# Patient Record
Sex: Female | Born: 1960 | Race: Black or African American | Hispanic: No | Marital: Single | Smoking: Former smoker
Health system: Southern US, Community
[De-identification: ages and names within clinical notes are randomized; demographics above are authoritative.]

## PROBLEM LIST (undated history)

## (undated) DIAGNOSIS — F32A Depression, unspecified: Secondary | ICD-10-CM

## (undated) DIAGNOSIS — Z9221 Personal history of antineoplastic chemotherapy: Secondary | ICD-10-CM

## (undated) DIAGNOSIS — Z923 Personal history of irradiation: Secondary | ICD-10-CM

## (undated) DIAGNOSIS — K219 Gastro-esophageal reflux disease without esophagitis: Secondary | ICD-10-CM

## (undated) DIAGNOSIS — K8020 Calculus of gallbladder without cholecystitis without obstruction: Secondary | ICD-10-CM

## (undated) DIAGNOSIS — F329 Major depressive disorder, single episode, unspecified: Secondary | ICD-10-CM

## (undated) DIAGNOSIS — E119 Type 2 diabetes mellitus without complications: Secondary | ICD-10-CM

## (undated) DIAGNOSIS — D649 Anemia, unspecified: Secondary | ICD-10-CM

## (undated) DIAGNOSIS — F209 Schizophrenia, unspecified: Secondary | ICD-10-CM

## (undated) DIAGNOSIS — E785 Hyperlipidemia, unspecified: Secondary | ICD-10-CM

## (undated) DIAGNOSIS — C801 Malignant (primary) neoplasm, unspecified: Secondary | ICD-10-CM

## (undated) DIAGNOSIS — I10 Essential (primary) hypertension: Secondary | ICD-10-CM

## (undated) HISTORY — DX: Anemia, unspecified: D64.9

## (undated) HISTORY — DX: Type 2 diabetes mellitus without complications: E11.9

## (undated) HISTORY — DX: Hyperlipidemia, unspecified: E78.5

## (undated) HISTORY — PX: ABDOMINAL HYSTERECTOMY: SHX81

## (undated) HISTORY — DX: Malignant (primary) neoplasm, unspecified: C80.1

## (undated) HISTORY — DX: Gastro-esophageal reflux disease without esophagitis: K21.9

---

## 1998-04-13 ENCOUNTER — Inpatient Hospital Stay (HOSPITAL_COMMUNITY): Admission: RE | Admit: 1998-04-13 | Discharge: 1998-04-16 | Payer: Self-pay | Admitting: Obstetrics & Gynecology

## 1998-04-25 ENCOUNTER — Inpatient Hospital Stay (HOSPITAL_COMMUNITY): Admission: AD | Admit: 1998-04-25 | Discharge: 1998-05-03 | Payer: Self-pay | Admitting: Obstetrics & Gynecology

## 1998-05-04 ENCOUNTER — Inpatient Hospital Stay (HOSPITAL_COMMUNITY): Admission: EM | Admit: 1998-05-04 | Discharge: 1998-05-04 | Payer: Self-pay | Admitting: Obstetrics

## 1998-05-08 ENCOUNTER — Inpatient Hospital Stay (HOSPITAL_COMMUNITY): Admission: AD | Admit: 1998-05-08 | Discharge: 1998-05-08 | Payer: Self-pay | Admitting: Obstetrics & Gynecology

## 1998-05-19 ENCOUNTER — Encounter: Admission: RE | Admit: 1998-05-19 | Discharge: 1998-05-19 | Payer: Self-pay | Admitting: Obstetrics & Gynecology

## 1998-08-04 ENCOUNTER — Encounter: Admission: RE | Admit: 1998-08-04 | Discharge: 1998-08-04 | Payer: Self-pay | Admitting: Obstetrics & Gynecology

## 1998-09-15 ENCOUNTER — Ambulatory Visit (HOSPITAL_COMMUNITY): Admission: RE | Admit: 1998-09-15 | Discharge: 1998-09-15 | Payer: Self-pay | Admitting: Hematology and Oncology

## 1998-09-15 ENCOUNTER — Encounter: Admission: RE | Admit: 1998-09-15 | Discharge: 1998-09-15 | Payer: Self-pay | Admitting: Hematology and Oncology

## 1998-12-23 ENCOUNTER — Encounter: Admission: RE | Admit: 1998-12-23 | Discharge: 1998-12-23 | Payer: Self-pay | Admitting: Hematology and Oncology

## 1999-05-25 ENCOUNTER — Ambulatory Visit (HOSPITAL_COMMUNITY): Admission: RE | Admit: 1999-05-25 | Discharge: 1999-05-25 | Payer: Self-pay | Admitting: Psychiatry

## 1999-09-20 ENCOUNTER — Encounter: Admission: RE | Admit: 1999-09-20 | Discharge: 1999-09-20 | Payer: Self-pay | Admitting: Hematology and Oncology

## 1999-09-20 ENCOUNTER — Emergency Department (HOSPITAL_COMMUNITY): Admission: EM | Admit: 1999-09-20 | Discharge: 1999-09-20 | Payer: Self-pay | Admitting: Emergency Medicine

## 1999-09-20 ENCOUNTER — Encounter: Payer: Self-pay | Admitting: Emergency Medicine

## 1999-10-21 ENCOUNTER — Encounter: Admission: RE | Admit: 1999-10-21 | Discharge: 1999-10-21 | Payer: Self-pay | Admitting: Internal Medicine

## 2000-01-03 ENCOUNTER — Encounter: Admission: RE | Admit: 2000-01-03 | Discharge: 2000-01-03 | Payer: Self-pay | Admitting: Internal Medicine

## 2000-05-15 ENCOUNTER — Encounter: Admission: RE | Admit: 2000-05-15 | Discharge: 2000-05-15 | Payer: Self-pay | Admitting: Internal Medicine

## 2000-07-10 ENCOUNTER — Encounter: Admission: RE | Admit: 2000-07-10 | Discharge: 2000-07-10 | Payer: Self-pay | Admitting: Internal Medicine

## 2001-08-07 ENCOUNTER — Other Ambulatory Visit: Admission: RE | Admit: 2001-08-07 | Discharge: 2001-08-07 | Payer: Self-pay

## 2001-08-07 ENCOUNTER — Encounter: Admission: RE | Admit: 2001-08-07 | Discharge: 2001-08-07 | Payer: Self-pay | Admitting: Internal Medicine

## 2002-07-08 ENCOUNTER — Encounter: Payer: Self-pay | Admitting: Emergency Medicine

## 2002-07-08 ENCOUNTER — Emergency Department (HOSPITAL_COMMUNITY): Admission: EM | Admit: 2002-07-08 | Discharge: 2002-07-09 | Payer: Self-pay | Admitting: Unknown Physician Specialty

## 2002-07-08 ENCOUNTER — Emergency Department (HOSPITAL_COMMUNITY): Admission: EM | Admit: 2002-07-08 | Discharge: 2002-07-08 | Payer: Self-pay | Admitting: Emergency Medicine

## 2002-07-19 ENCOUNTER — Other Ambulatory Visit: Admission: RE | Admit: 2002-07-19 | Discharge: 2002-07-19 | Payer: Self-pay | Admitting: Family Medicine

## 2002-07-25 ENCOUNTER — Ambulatory Visit (HOSPITAL_COMMUNITY): Admission: RE | Admit: 2002-07-25 | Discharge: 2002-07-25 | Payer: Self-pay | Admitting: Family Medicine

## 2002-12-03 ENCOUNTER — Encounter: Payer: Self-pay | Admitting: Family Medicine

## 2002-12-03 ENCOUNTER — Encounter: Admission: RE | Admit: 2002-12-03 | Discharge: 2002-12-03 | Payer: Self-pay | Admitting: Family Medicine

## 2003-07-29 ENCOUNTER — Ambulatory Visit (HOSPITAL_COMMUNITY): Admission: RE | Admit: 2003-07-29 | Discharge: 2003-07-29 | Payer: Self-pay | Admitting: Family Medicine

## 2004-10-27 ENCOUNTER — Ambulatory Visit: Payer: Self-pay | Admitting: Nurse Practitioner

## 2004-12-09 ENCOUNTER — Ambulatory Visit: Payer: Self-pay | Admitting: Nurse Practitioner

## 2004-12-09 ENCOUNTER — Other Ambulatory Visit: Admission: RE | Admit: 2004-12-09 | Discharge: 2004-12-09 | Payer: Self-pay | Admitting: Family Medicine

## 2004-12-22 ENCOUNTER — Ambulatory Visit (HOSPITAL_COMMUNITY): Admission: RE | Admit: 2004-12-22 | Discharge: 2004-12-22 | Payer: Self-pay | Admitting: Family Medicine

## 2005-07-08 ENCOUNTER — Ambulatory Visit: Payer: Self-pay | Admitting: Nurse Practitioner

## 2005-10-31 ENCOUNTER — Ambulatory Visit: Payer: Self-pay | Admitting: Nurse Practitioner

## 2005-11-09 ENCOUNTER — Ambulatory Visit: Payer: Self-pay | Admitting: Internal Medicine

## 2005-11-11 ENCOUNTER — Ambulatory Visit: Payer: Self-pay | Admitting: Nurse Practitioner

## 2005-12-08 ENCOUNTER — Ambulatory Visit: Payer: Self-pay | Admitting: Nurse Practitioner

## 2006-01-05 ENCOUNTER — Ambulatory Visit: Payer: Self-pay | Admitting: Internal Medicine

## 2006-01-11 ENCOUNTER — Ambulatory Visit: Payer: Self-pay | Admitting: Nurse Practitioner

## 2006-01-25 ENCOUNTER — Ambulatory Visit (HOSPITAL_COMMUNITY): Admission: RE | Admit: 2006-01-25 | Discharge: 2006-01-25 | Payer: Self-pay | Admitting: Family Medicine

## 2006-02-02 ENCOUNTER — Ambulatory Visit: Payer: Self-pay | Admitting: Family Medicine

## 2006-02-22 ENCOUNTER — Ambulatory Visit: Payer: Self-pay | Admitting: Family Medicine

## 2006-02-22 ENCOUNTER — Encounter (INDEPENDENT_AMBULATORY_CARE_PROVIDER_SITE_OTHER): Payer: Self-pay | Admitting: Nurse Practitioner

## 2006-02-22 ENCOUNTER — Encounter (INDEPENDENT_AMBULATORY_CARE_PROVIDER_SITE_OTHER): Payer: Self-pay | Admitting: Family Medicine

## 2006-02-22 LAB — CONVERTED CEMR LAB: Pap Smear: NORMAL

## 2006-05-24 ENCOUNTER — Ambulatory Visit: Payer: Self-pay | Admitting: Nurse Practitioner

## 2006-07-13 ENCOUNTER — Ambulatory Visit: Payer: Self-pay | Admitting: Nurse Practitioner

## 2006-08-09 ENCOUNTER — Ambulatory Visit (HOSPITAL_COMMUNITY): Admission: RE | Admit: 2006-08-09 | Discharge: 2006-08-09 | Payer: Self-pay | Admitting: Family Medicine

## 2006-08-09 ENCOUNTER — Ambulatory Visit: Payer: Self-pay | Admitting: Nurse Practitioner

## 2006-08-10 ENCOUNTER — Ambulatory Visit (HOSPITAL_COMMUNITY): Admission: RE | Admit: 2006-08-10 | Discharge: 2006-08-10 | Payer: Self-pay | Admitting: Family Medicine

## 2006-08-10 ENCOUNTER — Encounter: Payer: Self-pay | Admitting: Vascular Surgery

## 2006-08-23 ENCOUNTER — Ambulatory Visit: Payer: Self-pay | Admitting: Nurse Practitioner

## 2006-08-29 ENCOUNTER — Ambulatory Visit (HOSPITAL_COMMUNITY): Admission: RE | Admit: 2006-08-29 | Discharge: 2006-08-29 | Payer: Self-pay | Admitting: Family Medicine

## 2006-09-20 ENCOUNTER — Ambulatory Visit: Payer: Self-pay | Admitting: Family Medicine

## 2006-11-07 ENCOUNTER — Ambulatory Visit: Payer: Self-pay | Admitting: Internal Medicine

## 2006-11-09 ENCOUNTER — Ambulatory Visit: Payer: Self-pay | Admitting: Internal Medicine

## 2007-02-07 ENCOUNTER — Ambulatory Visit (HOSPITAL_COMMUNITY): Admission: RE | Admit: 2007-02-07 | Discharge: 2007-02-07 | Payer: Self-pay | Admitting: Nurse Practitioner

## 2007-02-21 ENCOUNTER — Ambulatory Visit: Payer: Self-pay | Admitting: Family Medicine

## 2007-05-16 ENCOUNTER — Ambulatory Visit: Payer: Self-pay | Admitting: Internal Medicine

## 2007-05-17 ENCOUNTER — Encounter (INDEPENDENT_AMBULATORY_CARE_PROVIDER_SITE_OTHER): Payer: Self-pay | Admitting: Nurse Practitioner

## 2007-05-17 LAB — CONVERTED CEMR LAB: Microalbumin U total vol: 4.4 mg/L

## 2007-07-24 ENCOUNTER — Encounter (INDEPENDENT_AMBULATORY_CARE_PROVIDER_SITE_OTHER): Payer: Self-pay | Admitting: Nurse Practitioner

## 2007-07-24 DIAGNOSIS — Z8659 Personal history of other mental and behavioral disorders: Secondary | ICD-10-CM | POA: Insufficient documentation

## 2007-07-24 DIAGNOSIS — E119 Type 2 diabetes mellitus without complications: Secondary | ICD-10-CM | POA: Insufficient documentation

## 2007-07-24 DIAGNOSIS — E782 Mixed hyperlipidemia: Secondary | ICD-10-CM | POA: Insufficient documentation

## 2007-07-24 DIAGNOSIS — F205 Residual schizophrenia: Secondary | ICD-10-CM | POA: Insufficient documentation

## 2007-07-24 DIAGNOSIS — D649 Anemia, unspecified: Secondary | ICD-10-CM | POA: Insufficient documentation

## 2007-07-24 DIAGNOSIS — I10 Essential (primary) hypertension: Secondary | ICD-10-CM | POA: Insufficient documentation

## 2007-07-24 DIAGNOSIS — K219 Gastro-esophageal reflux disease without esophagitis: Secondary | ICD-10-CM | POA: Insufficient documentation

## 2007-10-31 ENCOUNTER — Other Ambulatory Visit: Admission: RE | Admit: 2007-10-31 | Discharge: 2007-10-31 | Payer: Self-pay | Admitting: Family Medicine

## 2007-12-27 ENCOUNTER — Emergency Department (HOSPITAL_COMMUNITY): Admission: EM | Admit: 2007-12-27 | Discharge: 2007-12-28 | Payer: Self-pay | Admitting: Emergency Medicine

## 2008-04-08 ENCOUNTER — Ambulatory Visit (HOSPITAL_COMMUNITY): Admission: RE | Admit: 2008-04-08 | Discharge: 2008-04-08 | Payer: Self-pay | Admitting: Family Medicine

## 2008-11-12 ENCOUNTER — Other Ambulatory Visit: Admission: RE | Admit: 2008-11-12 | Discharge: 2008-11-12 | Payer: Self-pay | Admitting: Family Medicine

## 2009-04-15 ENCOUNTER — Encounter: Admission: RE | Admit: 2009-04-15 | Discharge: 2009-04-15 | Payer: Self-pay | Admitting: Family Medicine

## 2009-11-25 ENCOUNTER — Other Ambulatory Visit: Admission: RE | Admit: 2009-11-25 | Discharge: 2009-11-25 | Payer: Self-pay | Admitting: Family Medicine

## 2010-04-21 ENCOUNTER — Ambulatory Visit (HOSPITAL_COMMUNITY): Admission: RE | Admit: 2010-04-21 | Discharge: 2010-04-21 | Payer: Self-pay | Admitting: Family Medicine

## 2011-05-04 ENCOUNTER — Other Ambulatory Visit (HOSPITAL_COMMUNITY): Payer: Self-pay | Admitting: Family Medicine

## 2011-05-04 DIAGNOSIS — Z1231 Encounter for screening mammogram for malignant neoplasm of breast: Secondary | ICD-10-CM

## 2011-05-19 ENCOUNTER — Ambulatory Visit (HOSPITAL_COMMUNITY): Payer: Medicaid Other

## 2011-06-01 ENCOUNTER — Ambulatory Visit (HOSPITAL_COMMUNITY)
Admission: RE | Admit: 2011-06-01 | Discharge: 2011-06-01 | Disposition: A | Discharge status: Home / Self Care | Payer: Medicaid Other | Source: Ambulatory Visit | Attending: Family Medicine | Admitting: Family Medicine

## 2011-06-01 DIAGNOSIS — Z1231 Encounter for screening mammogram for malignant neoplasm of breast: Secondary | ICD-10-CM

## 2011-09-01 LAB — URINALYSIS, ROUTINE W REFLEX MICROSCOPIC
Bilirubin Urine: NEGATIVE
Glucose, UA: NEGATIVE
Hgb urine dipstick: NEGATIVE
Ketones, ur: 15 — AB
Leukocytes, UA: NEGATIVE
Nitrite: NEGATIVE
Protein, ur: 30 — AB
Specific Gravity, Urine: 1.025
Urobilinogen, UA: 1
pH: 6.5

## 2011-09-01 LAB — URINE MICROSCOPIC-ADD ON

## 2011-09-01 LAB — I-STAT 8, (EC8 V) (CONVERTED LAB)
Acid-Base Excess: 3 — ABNORMAL HIGH
BUN: 7
Bicarbonate: 27.3 — ABNORMAL HIGH
Chloride: 105
Glucose, Bld: 110 — ABNORMAL HIGH
HCT: 38
Hemoglobin: 12.9
Operator id: 261381
Potassium: 3.8
Sodium: 139
TCO2: 29
pCO2, Ven: 41.5 — ABNORMAL LOW
pH, Ven: 7.426 — ABNORMAL HIGH

## 2011-09-01 LAB — DIFFERENTIAL
Basophils Absolute: 0
Basophils Relative: 0
Eosinophils Absolute: 0.1
Eosinophils Relative: 2
Lymphocytes Relative: 54 — ABNORMAL HIGH
Lymphs Abs: 3.3
Monocytes Absolute: 0.5
Monocytes Relative: 8
Neutro Abs: 2.2
Neutrophils Relative %: 36 — ABNORMAL LOW

## 2011-09-01 LAB — CBC
HCT: 35.7 — ABNORMAL LOW
Hemoglobin: 12.3
MCHC: 34.4
MCV: 85.7
Platelets: 259
RBC: 4.17
RDW: 12.1
WBC: 6.1

## 2011-09-01 LAB — POCT PREGNANCY, URINE
Operator id: 294341
Preg Test, Ur: NEGATIVE

## 2011-09-01 LAB — POCT I-STAT CREATININE
Creatinine, Ser: 0.6
Operator id: 261381

## 2011-12-20 ENCOUNTER — Other Ambulatory Visit (HOSPITAL_COMMUNITY)
Admission: RE | Admit: 2011-12-20 | Discharge: 2011-12-20 | Disposition: A | Discharge status: Home / Self Care | Payer: Medicaid Other | Source: Ambulatory Visit | Attending: Family Medicine | Admitting: Family Medicine

## 2011-12-20 ENCOUNTER — Other Ambulatory Visit: Payer: Self-pay | Admitting: Family Medicine

## 2011-12-20 DIAGNOSIS — Z1159 Encounter for screening for other viral diseases: Secondary | ICD-10-CM | POA: Insufficient documentation

## 2011-12-20 DIAGNOSIS — Z01419 Encounter for gynecological examination (general) (routine) without abnormal findings: Secondary | ICD-10-CM | POA: Insufficient documentation

## 2012-07-05 ENCOUNTER — Other Ambulatory Visit (HOSPITAL_COMMUNITY): Payer: Self-pay | Admitting: Family Medicine

## 2012-07-05 DIAGNOSIS — Z1231 Encounter for screening mammogram for malignant neoplasm of breast: Secondary | ICD-10-CM

## 2012-08-01 ENCOUNTER — Ambulatory Visit (HOSPITAL_COMMUNITY)
Admission: RE | Admit: 2012-08-01 | Discharge: 2012-08-01 | Disposition: A | Discharge status: Home / Self Care | Payer: Medicaid Other | Source: Ambulatory Visit | Attending: Family Medicine | Admitting: Family Medicine

## 2012-08-01 DIAGNOSIS — Z1231 Encounter for screening mammogram for malignant neoplasm of breast: Secondary | ICD-10-CM

## 2013-09-17 ENCOUNTER — Other Ambulatory Visit (HOSPITAL_COMMUNITY): Payer: Self-pay | Admitting: Family Medicine

## 2013-09-17 DIAGNOSIS — Z1231 Encounter for screening mammogram for malignant neoplasm of breast: Secondary | ICD-10-CM

## 2013-09-25 ENCOUNTER — Ambulatory Visit (HOSPITAL_COMMUNITY)
Admission: RE | Admit: 2013-09-25 | Discharge: 2013-09-25 | Disposition: A | Discharge status: Home / Self Care | Payer: Medicaid Other | Source: Ambulatory Visit | Attending: Family Medicine | Admitting: Family Medicine

## 2013-09-25 DIAGNOSIS — Z1231 Encounter for screening mammogram for malignant neoplasm of breast: Secondary | ICD-10-CM

## 2013-11-20 ENCOUNTER — Other Ambulatory Visit: Payer: Self-pay | Admitting: Family Medicine

## 2013-11-20 ENCOUNTER — Other Ambulatory Visit (HOSPITAL_COMMUNITY)
Admission: RE | Admit: 2013-11-20 | Discharge: 2013-11-20 | Disposition: A | Discharge status: Home / Self Care | Payer: Medicaid Other | Source: Ambulatory Visit | Attending: Family Medicine | Admitting: Family Medicine

## 2013-11-20 DIAGNOSIS — Z01419 Encounter for gynecological examination (general) (routine) without abnormal findings: Secondary | ICD-10-CM | POA: Insufficient documentation

## 2014-03-10 ENCOUNTER — Other Ambulatory Visit: Payer: Self-pay | Admitting: Family Medicine

## 2014-03-10 DIAGNOSIS — R109 Unspecified abdominal pain: Secondary | ICD-10-CM

## 2014-03-14 ENCOUNTER — Ambulatory Visit
Admission: RE | Admit: 2014-03-14 | Discharge: 2014-03-14 | Disposition: A | Discharge status: Home / Self Care | Payer: Medicaid Other | Source: Ambulatory Visit | Attending: Family Medicine | Admitting: Family Medicine

## 2014-03-14 DIAGNOSIS — R109 Unspecified abdominal pain: Secondary | ICD-10-CM

## 2014-04-02 ENCOUNTER — Encounter (INDEPENDENT_AMBULATORY_CARE_PROVIDER_SITE_OTHER): Payer: Self-pay | Admitting: Surgery

## 2014-04-04 ENCOUNTER — Ambulatory Visit (INDEPENDENT_AMBULATORY_CARE_PROVIDER_SITE_OTHER): Payer: Medicaid Other | Admitting: Surgery

## 2014-04-04 ENCOUNTER — Encounter (INDEPENDENT_AMBULATORY_CARE_PROVIDER_SITE_OTHER): Payer: Self-pay | Admitting: Surgery

## 2014-04-04 VITALS — BP 122/78 | HR 76 | Temp 97.0°F | Ht 62.0 in | Wt 182.0 lb

## 2014-04-04 DIAGNOSIS — K8010 Calculus of gallbladder with chronic cholecystitis without obstruction: Secondary | ICD-10-CM

## 2014-04-04 NOTE — Progress Notes (Signed)
Patient ID: Alison [name], female   DOB: 07/07/1961, 53 y.o.   MRN: [id number]  Chief Complaint  Patient presents with  . eval gallstones    new pt    HPI Alison [name] is a 53 y.o. female.  Referred by Dr. Lucianne Lei for symptomatic gallbladder disease  HPI  Past Medical History  Diagnosis Date  . Diabetes mellitus without complication   Schizophrenia Hyperlipidemia Hypertension GERD Chronic anemia  PSH - Laparoscopic-assisted vaginal hysterectomy  FH:  Mother - breast cancer  Social History History  Substance Use Topics  . Smoking status: Never Smoker   . Smokeless tobacco: Not on file  . Alcohol Use: No    No Known Allergies  Prior to Admission medications   Medication Sig Start Date End Date Taking? Authorizing Provider  acetaminophen (TYLENOL) 325 MG tablet Take 650 mg by mouth every 6 (six) hours as needed.   Yes Historical Provider, MD  clotrimazole-betamethasone (LOTRISONE) cream Apply 1 application topically 2 (two) times daily.   Yes Historical Provider, MD  lisinopril (PRINIVIL,ZESTRIL) 10 MG tablet Take 10 mg by mouth daily.   Yes Historical Provider, MD  terconazole (TERAZOL 7) 0.4 % vaginal cream Place 1 applicator vaginally at bedtime.   Yes Historical Provider, MD   Depakote   Review of Systems Review of Systems  Constitutional: Negative for fever, chills and unexpected weight change.  HENT: Negative for congestion, hearing loss, sore throat, trouble swallowing and voice change.   Eyes: Negative for visual disturbance.  Respiratory: Negative for cough and wheezing.   Cardiovascular: Negative for chest pain, palpitations and leg swelling.  Gastrointestinal: Positive for abdominal pain and abdominal distention. Negative for nausea, vomiting, diarrhea, constipation, blood in stool and anal bleeding.  Genitourinary: Negative for hematuria, vaginal bleeding and difficulty urinating.  Musculoskeletal: Negative for arthralgias.  Skin: Negative for  rash and wound.  Neurological: Negative for seizures, syncope and headaches.  Hematological: Negative for adenopathy. Does not bruise/bleed easily.  Psychiatric/Behavioral: Negative for confusion.    Blood pressure 122/78, pulse 76, temperature 97 F (36.1 C), height 5\' 2"  (1.575 m), weight 182 lb (82.555 kg).  Physical Exam Physical Exam WDWN in NAD HEENT:  EOMI, sclera anicteric Neck:  No masses, no thyromegaly Lungs:  CTA bilaterally; normal respiratory effort CV:  Regular rate and rhythm; no murmurs Abd:  +bowel sounds, soft, mildly tender in RUQ Ext:  Well-perfused; no edema Skin:  Warm, dry; no sign of jaundice  Data Reviewed US Abdomen Complete  03/14/2014   CLINICAL DATA:  Abdominal pain. Right upper quadrant and left lower quadrant pain.  EXAM: ULTRASOUND ABDOMEN COMPLETE  COMPARISON:  CT ABD W/CM dated 12/28/2007; CT PELVIS W/CM dated 12/28/2007  FINDINGS: Gallbladder:  The wall echo shadow complex is identified in the gallbladder fossa, consistent with gallbladder distended by numerous gallstones. Gallbladder wall is not well evaluated because of the numerous stones. No pericholecystic fluid or sonographic Murphy's sign.  Common bile duct:  Diameter: 6.8 mm  Liver:  Echogenic without focal lesion  IVC:  No abnormality visualized.  Pancreas:  Visualized portion unremarkable.  Spleen:  Normal in appearance, 6.8 cm  Right Kidney:  Length: 12.4 cm. Echogenicity within normal limits. No mass or hydronephrosis visualized.  Left Kidney:  Length: 12.2 cm and. Echogenicity within normal limits. No mass or hydronephrosis visualized.  Abdominal aorta:  Aortic bifurcation evaluation is limited because of overlying bowel loops. The visualized portion of the aorta is not aneurysmal.  Other findings:  None.  IMPRESSION:  1. Gallbladder contains numerous gallstones. These are not measurable because of the associated shadowing. 2. Gallbladder wall is not well evaluated because of the large number of  stones. 3. Positive sonographic Murphy sign. Findings are consistent with acute cholecystitis. 4. These results will be called to the ordering clinician or representative by the Radiologist Assistant, and communication documented in the PACS Dashboard.   Electronically Signed   By: Shon Hale M.D.   On: 03/14/2014 08:32      Assessment    Chronic calculus cholecystitis      Plan    Laparoscopic cholecystectomy with intraoperative cholangiogram.  The surgical procedure has been discussed with the patient.  Potential risks, benefits, alternative treatments, and expected outcomes have been explained.  All of the patient's questions at this time have been answered.  The likelihood of reaching the patient's treatment goal is good.  The patient understand the proposed surgical procedure and wishes to proceed.         Alison [name]. Dynasti Kerman 04/04/2014, 9:24 AM

## 2014-04-09 ENCOUNTER — Encounter (HOSPITAL_COMMUNITY): Payer: Self-pay | Admitting: Pharmacy Technician

## 2014-04-09 ENCOUNTER — Encounter (HOSPITAL_COMMUNITY)
Admission: RE | Admit: 2014-04-09 | Discharge: 2014-04-09 | Disposition: A | Discharge status: Home / Self Care | Payer: Medicaid Other | Source: Ambulatory Visit | Attending: Surgery | Admitting: Surgery

## 2014-04-09 ENCOUNTER — Encounter (HOSPITAL_COMMUNITY): Payer: Self-pay

## 2014-04-09 ENCOUNTER — Ambulatory Visit (HOSPITAL_COMMUNITY)
Admission: RE | Admit: 2014-04-09 | Discharge: 2014-04-09 | Disposition: A | Discharge status: Home / Self Care | Payer: Medicaid Other | Source: Ambulatory Visit | Attending: Anesthesiology | Admitting: Anesthesiology

## 2014-04-09 ENCOUNTER — Other Ambulatory Visit (HOSPITAL_COMMUNITY): Payer: Self-pay | Admitting: *Deleted

## 2014-04-09 VITALS — BP 124/86 | HR 92 | Temp 98.1°F | Resp 16 | Ht 63.0 in | Wt 189.0 lb

## 2014-04-09 DIAGNOSIS — Z01812 Encounter for preprocedural laboratory examination: Secondary | ICD-10-CM | POA: Insufficient documentation

## 2014-04-09 DIAGNOSIS — Z01818 Encounter for other preprocedural examination: Secondary | ICD-10-CM | POA: Insufficient documentation

## 2014-04-09 DIAGNOSIS — Z01810 Encounter for preprocedural cardiovascular examination: Secondary | ICD-10-CM | POA: Insufficient documentation

## 2014-04-09 HISTORY — DX: Calculus of gallbladder without cholecystitis without obstruction: K80.20

## 2014-04-09 HISTORY — DX: Schizophrenia, unspecified: F20.9

## 2014-04-09 HISTORY — DX: Essential (primary) hypertension: I10

## 2014-04-09 LAB — BASIC METABOLIC PANEL
BUN: 9 mg/dL (ref 6–23)
CO2: 27 mEq/L (ref 19–32)
Calcium: 9.6 mg/dL (ref 8.4–10.5)
Chloride: 100 mEq/L (ref 96–112)
Creatinine, Ser: 0.6 mg/dL (ref 0.50–1.10)
GFR calc Af Amer: >90 mL/min (ref >90)
GFR calc non Af Amer: >90 mL/min (ref >90)
Glucose, Bld: 179 mg/dL — ABNORMAL HIGH (ref 70–99)
Potassium: 4 mEq/L (ref 3.7–5.3)
Sodium: 142 mEq/L (ref 137–147)

## 2014-04-09 LAB — CBC
HCT: 38.2 % (ref 36.0–46.0)
Hemoglobin: 12.8 g/dL (ref 12.0–15.0)
MCH: 28.3 pg (ref 26.0–34.0)
MCHC: 33.5 g/dL (ref 30.0–36.0)
MCV: 84.3 fL (ref 78.0–100.0)
Platelets: 265 10*3/uL (ref 150–400)
RBC: 4.53 MIL/uL (ref 3.87–5.11)
RDW: 12.6 % (ref 11.5–15.5)
WBC: 5.3 10*3/uL (ref 4.0–10.5)

## 2014-04-09 NOTE — Patient Instructions (Addendum)
Alison [name]  04/09/2014                           YOUR PROCEDURE IS SCHEDULED ON: 04/16/14 AT 1:40 PM               PLEASE REPORT TO SHORT STAY CENTER AT : 11:45 AM               CALL THIS NUMBER IF ANY PROBLEMS THE DAY OF SURGERY :               [phone]                                REMEMBER:   Do not eat food  AFTER MIDNIGHT   May have clear liquids UNTIL 6 HOURS BEFORE SURGERY (7:40 AM)     CLEAR LIQUID DIET   Foods Allowed                                                                     Foods Excluded  Coffee and tea, regular and decaf                             liquids that you cannot  Plain Jell-O in any flavor                                             see through such as: Fruit ices (not with fruit pulp)                                     milk, soups, orange juice  Iced Popsicles                                    All solid food Carbonated beverages, regular and diet                                    Cranberry, grape and apple juices Sports drinks like Gatorade Lightly seasoned clear broth or consume(fat free) Sugar, honey syrup  _____________________________________________________________________  _____________________________________________________________________                 Take these medicines the morning of surgery with               A SIPS OF WATER :  COGENTIN / OMEPRAZOLE               STOP ASPIRIN AND HERBAL MEDS 5 DAYS PEOP       Do not wear jewelry, make-up   Do not wear lotions, powders, or perfumes.   Do not shave legs or underarms 12 hrs. before surgery (men may shave face)  Do not bring valuables to the hospital.  Contacts, dentures or bridgework may  not be worn into surgery.  Leave suitcase in the car. After surgery it may be brought to your room.  For patients admitted to the hospital more than one night, checkout time is            11:00 AM                                                       The day of  discharge.   Patients discharged the day of surgery will not be allowed to drive home.            If going home same day of surgery, must have someone stay with you              FIRST 24 hrs at home and arrange for some one to drive you              home from hospital.   ________________________________________________________________________                                                          [facility] - Preparing for Surgery Before surgery, you can play an important role.  Because skin is not sterile, your skin needs to be as free of germs as possible.  You can reduce the number of germs on your skin by washing with CHG (chlorahexidine gluconate) soap before surgery.  CHG is an antiseptic cleaner which kills germs and bonds with the skin to continue killing germs even after washing. Please DO NOT use if you have an allergy to CHG or antibacterial soaps.  If your skin becomes reddened/irritated stop using the CHG and inform your nurse when you arrive at Short Stay. Do not shave (including legs and underarms) for at least 48 hours prior to the first CHG shower.  You may shave your face. Please follow these instructions carefully:  1.  Shower with CHG Soap the night before surgery and the  morning of Surgery.   2.  If you choose to wash your hair, wash your hair first as usual with your  normal  Shampoo.   3.  After you shampoo, rinse your hair and body thoroughly to remove the  shampoo.                                         4.  Use CHG as you would any other liquid soap.  You can apply chg directly  to the skin and wash . Gently wash with scrungie or clean wascloth    5.  Apply the CHG Soap to your body ONLY FROM THE NECK DOWN.   Do not use on open                           Wound or open sores. Avoid contact with eyes, ears mouth and genitals (private parts).                        Genitals (private  parts) with your normal soap.              6.  Wash thoroughly, paying  special attention to the area where your surgery  will be performed.   7.  Thoroughly rinse your body with warm water from the neck down.   8.  DO NOT shower/wash with your normal soap after using and rinsing off  the CHG Soap .                9.  Pat yourself dry with a clean towel.             10.  Wear clean pajamas.             11.  Place clean sheets on your bed the night of your first shower and do not  sleep with pets.  Day of Surgery : Do not apply any lotions/deodorants the morning of surgery.  Please wear clean clothes to the hospital/surgery center.  FAILURE TO FOLLOW THESE INSTRUCTIONS MAY RESULT IN THE CANCELLATION OF YOUR SURGERY    PATIENT SIGNATURE_________________________________

## 2014-04-16 ENCOUNTER — Encounter (HOSPITAL_COMMUNITY): Payer: Medicaid Other | Admitting: Anesthesiology

## 2014-04-16 ENCOUNTER — Ambulatory Visit (HOSPITAL_COMMUNITY)
Admission: RE | Admit: 2014-04-16 | Discharge: 2014-04-17 | Disposition: A | Discharge status: Home / Self Care | Payer: Medicaid Other | Source: Ambulatory Visit | Attending: Surgery | Admitting: Surgery

## 2014-04-16 ENCOUNTER — Encounter (HOSPITAL_COMMUNITY)
Admission: RE | Disposition: A | Discharge status: Home / Self Care | Payer: Self-pay | Source: Ambulatory Visit | Attending: Surgery

## 2014-04-16 ENCOUNTER — Encounter (HOSPITAL_COMMUNITY): Payer: Self-pay | Admitting: *Deleted

## 2014-04-16 ENCOUNTER — Ambulatory Visit (HOSPITAL_COMMUNITY): Payer: Medicaid Other | Admitting: Anesthesiology

## 2014-04-16 DIAGNOSIS — K219 Gastro-esophageal reflux disease without esophagitis: Secondary | ICD-10-CM | POA: Insufficient documentation

## 2014-04-16 DIAGNOSIS — F209 Schizophrenia, unspecified: Secondary | ICD-10-CM | POA: Insufficient documentation

## 2014-04-16 DIAGNOSIS — K8010 Calculus of gallbladder with chronic cholecystitis without obstruction: Secondary | ICD-10-CM | POA: Insufficient documentation

## 2014-04-16 DIAGNOSIS — E785 Hyperlipidemia, unspecified: Secondary | ICD-10-CM | POA: Insufficient documentation

## 2014-04-16 DIAGNOSIS — I10 Essential (primary) hypertension: Secondary | ICD-10-CM | POA: Insufficient documentation

## 2014-04-16 DIAGNOSIS — Z87891 Personal history of nicotine dependence: Secondary | ICD-10-CM | POA: Insufficient documentation

## 2014-04-16 DIAGNOSIS — E119 Type 2 diabetes mellitus without complications: Secondary | ICD-10-CM | POA: Insufficient documentation

## 2014-04-16 DIAGNOSIS — Z79899 Other long term (current) drug therapy: Secondary | ICD-10-CM | POA: Insufficient documentation

## 2014-04-16 HISTORY — PX: CHOLECYSTECTOMY: SHX55

## 2014-04-16 LAB — GLUCOSE, CAPILLARY
Glucose-Capillary: 147 mg/dL — ABNORMAL HIGH (ref 70–99)
Glucose-Capillary: 209 mg/dL — ABNORMAL HIGH (ref 70–99)
Glucose-Capillary: 215 mg/dL — ABNORMAL HIGH (ref 70–99)

## 2014-04-16 SURGERY — LAPAROSCOPIC CHOLECYSTECTOMY WITH INTRAOPERATIVE CHOLANGIOGRAM
Anesthesia: General

## 2014-04-16 MED ORDER — FENTANYL CITRATE 0.05 MG/ML IJ SOLN
INTRAMUSCULAR | Status: AC
  Filled 2014-04-16: qty 5

## 2014-04-16 MED ORDER — PROPOFOL 10 MG/ML IV BOLUS
INTRAVENOUS | Status: AC
  Filled 2014-04-16: qty 20

## 2014-04-16 MED ORDER — MORPHINE SULFATE 10 MG/ML IJ SOLN: 2.0000 mg | INTRAMUSCULAR | Status: DC | PRN

## 2014-04-16 MED ORDER — LACTATED RINGERS IV SOLN
INTRAVENOUS | Status: DC
  Administered 2014-04-16 – 2014-04-17 (×2): via INTRAVENOUS

## 2014-04-16 MED ORDER — GLYCOPYRROLATE 0.2 MG/ML IJ SOLN
INTRAMUSCULAR | Status: DC | PRN
  Administered 2014-04-16: 0.6 mg via INTRAVENOUS

## 2014-04-16 MED ORDER — OXYCODONE-ACETAMINOPHEN 5-325 MG PO TABS: 1.0000 | ORAL_TABLET | ORAL | Status: DC | PRN

## 2014-04-16 MED ORDER — LACTATED RINGERS IR SOLN
Status: DC | PRN
  Administered 2014-04-16: 1000 mL

## 2014-04-16 MED ORDER — CEFAZOLIN SODIUM-DEXTROSE 2-3 GM-% IV SOLR
INTRAVENOUS | Status: AC
  Filled 2014-04-16: qty 50

## 2014-04-16 MED ORDER — 0.9 % SODIUM CHLORIDE (POUR BTL) OPTIME
TOPICAL | Status: DC | PRN
  Administered 2014-04-16: 1000 mL

## 2014-04-16 MED ORDER — ONDANSETRON HCL 4 MG/2ML IJ SOLN: 4.0000 mg | Freq: Four times a day (QID) | INTRAMUSCULAR | Status: DC | PRN

## 2014-04-16 MED ORDER — INSULIN ASPART 100 UNIT/ML ~~LOC~~ SOLN
3.0000 [IU] | Freq: Three times a day (TID) | SUBCUTANEOUS | Status: DC
  Administered 2014-04-17: 3 [IU] via SUBCUTANEOUS

## 2014-04-16 MED ORDER — HYDROMORPHONE HCL PF 1 MG/ML IJ SOLN
INTRAMUSCULAR | Status: DC | PRN
Start: 2014-04-16 — End: 2014-04-16
  Administered 2014-04-16 (×2): 0.5 mg via INTRAVENOUS

## 2014-04-16 MED ORDER — OXYCODONE-ACETAMINOPHEN 5-325 MG PO TABS
1.0000 | ORAL_TABLET | ORAL | Status: DC | PRN
Start: 2014-04-16 — End: 2016-01-14

## 2014-04-16 MED ORDER — METFORMIN HCL 500 MG PO TABS
500.0000 mg | ORAL_TABLET | Freq: Two times a day (BID) | ORAL | Status: DC
  Administered 2014-04-16 – 2014-04-17 (×2): 500 mg via ORAL
  Filled 2014-04-16 (×4): qty 1

## 2014-04-16 MED ORDER — LISINOPRIL 10 MG PO TABS
10.0000 mg | ORAL_TABLET | Freq: Every morning | ORAL | Status: DC
  Administered 2014-04-17: 10 mg via ORAL
  Filled 2014-04-16: qty 1

## 2014-04-16 MED ORDER — MORPHINE SULFATE 2 MG/ML IJ SOLN: 2.0000 mg | INTRAMUSCULAR | Status: DC | PRN

## 2014-04-16 MED ORDER — ONDANSETRON HCL 4 MG PO TABS: 4.0000 mg | ORAL_TABLET | Freq: Four times a day (QID) | ORAL | Status: DC | PRN

## 2014-04-16 MED ORDER — CHLORHEXIDINE GLUCONATE 4 % EX LIQD: 1.0000 "application " | Freq: Once | CUTANEOUS | Status: DC

## 2014-04-16 MED ORDER — CEFAZOLIN SODIUM-DEXTROSE 2-3 GM-% IV SOLR
2.0000 g | INTRAVENOUS | Status: AC
  Administered 2014-04-16: 2 g via INTRAVENOUS

## 2014-04-16 MED ORDER — PROMETHAZINE HCL 25 MG/ML IJ SOLN: 6.2500 mg | INTRAMUSCULAR | Status: DC | PRN

## 2014-04-16 MED ORDER — NEOSTIGMINE METHYLSULFATE 10 MG/10ML IV SOLN
INTRAVENOUS | Status: DC | PRN
  Administered 2014-04-16: 4 mg via INTRAVENOUS

## 2014-04-16 MED ORDER — ROCURONIUM BROMIDE 100 MG/10ML IV SOLN
INTRAVENOUS | Status: DC | PRN
  Administered 2014-04-16: 50 mg via INTRAVENOUS

## 2014-04-16 MED ORDER — MIDAZOLAM HCL 2 MG/2ML IJ SOLN
INTRAMUSCULAR | Status: AC
  Filled 2014-04-16: qty 2

## 2014-04-16 MED ORDER — LACTATED RINGERS IV SOLN
INTRAVENOUS | Status: DC
  Administered 2014-04-16: 1000 mL via INTRAVENOUS
  Administered 2014-04-16 (×2): via INTRAVENOUS

## 2014-04-16 MED ORDER — ENOXAPARIN SODIUM 40 MG/0.4ML ~~LOC~~ SOLN
40.0000 mg | SUBCUTANEOUS | Status: DC
  Administered 2014-04-16: 40 mg via SUBCUTANEOUS
  Filled 2014-04-16 (×2): qty 0.4

## 2014-04-16 MED ORDER — DEXAMETHASONE SODIUM PHOSPHATE 10 MG/ML IJ SOLN
INTRAMUSCULAR | Status: AC
  Filled 2014-04-16: qty 1

## 2014-04-16 MED ORDER — SIMVASTATIN 10 MG PO TABS
10.0000 mg | ORAL_TABLET | Freq: Every day | ORAL | Status: DC
  Administered 2014-04-16: 10 mg via ORAL
  Filled 2014-04-16 (×2): qty 1

## 2014-04-16 MED ORDER — MEPERIDINE HCL 50 MG/ML IJ SOLN: 6.2500 mg | INTRAMUSCULAR | Status: DC | PRN

## 2014-04-16 MED ORDER — DIVALPROEX SODIUM ER 500 MG PO TB24
1000.0000 mg | ORAL_TABLET | Freq: Every day | ORAL | Status: DC
  Administered 2014-04-16: 1000 mg via ORAL
  Filled 2014-04-16 (×2): qty 2

## 2014-04-16 MED ORDER — ONDANSETRON HCL 4 MG/2ML IJ SOLN: 4.0000 mg | INTRAMUSCULAR | Status: DC | PRN

## 2014-04-16 MED ORDER — FENTANYL CITRATE 0.05 MG/ML IJ SOLN: 25.0000 ug | INTRAMUSCULAR | Status: DC | PRN

## 2014-04-16 MED ORDER — INSULIN ASPART 100 UNIT/ML ~~LOC~~ SOLN
0.0000 [IU] | Freq: Three times a day (TID) | SUBCUTANEOUS | Status: DC
  Administered 2014-04-17: 2 [IU] via SUBCUTANEOUS

## 2014-04-16 MED ORDER — LABETALOL HCL 5 MG/ML IV SOLN
INTRAVENOUS | Status: DC | PRN
Start: 2014-04-16 — End: 2014-04-16
  Administered 2014-04-16: 2.5 mg via INTRAVENOUS

## 2014-04-16 MED ORDER — BUPIVACAINE-EPINEPHRINE 0.25% -1:200000 IJ SOLN
INTRAMUSCULAR | Status: DC | PRN
  Administered 2014-04-16: 24 mL

## 2014-04-16 MED ORDER — DEXAMETHASONE SODIUM PHOSPHATE 10 MG/ML IJ SOLN
10.0000 mg | Freq: Once | INTRAMUSCULAR | Status: AC
  Administered 2014-04-16: 10 mg via INTRAVENOUS

## 2014-04-16 MED ORDER — SODIUM CHLORIDE 0.9 % IV SOLN
INTRAVENOUS | Status: DC | PRN
  Administered 2014-04-16: 15:00:00

## 2014-04-16 MED ORDER — LACTATED RINGERS IV SOLN: INTRAVENOUS | Status: DC

## 2014-04-16 MED ORDER — HYDROMORPHONE HCL PF 2 MG/ML IJ SOLN
INTRAMUSCULAR | Status: AC
  Filled 2014-04-16: qty 1

## 2014-04-16 MED ORDER — BENZTROPINE MESYLATE 0.5 MG PO TABS
0.5000 mg | ORAL_TABLET | Freq: Two times a day (BID) | ORAL | Status: DC
  Administered 2014-04-16 – 2014-04-17 (×2): 0.5 mg via ORAL
  Filled 2014-04-16 (×3): qty 1

## 2014-04-16 MED ORDER — BUPIVACAINE-EPINEPHRINE (PF) 0.25% -1:200000 IJ SOLN
INTRAMUSCULAR | Status: AC
  Filled 2014-04-16: qty 30

## 2014-04-16 MED ORDER — FUROSEMIDE 40 MG PO TABS
40.0000 mg | ORAL_TABLET | Freq: Every morning | ORAL | Status: DC
  Administered 2014-04-17: 40 mg via ORAL
  Filled 2014-04-16: qty 1

## 2014-04-16 MED ORDER — PROPOFOL 10 MG/ML IV BOLUS
INTRAVENOUS | Status: DC | PRN
  Administered 2014-04-16: 180 mg via INTRAVENOUS

## 2014-04-16 MED ORDER — MIDAZOLAM HCL 5 MG/5ML IJ SOLN
INTRAMUSCULAR | Status: DC | PRN
  Administered 2014-04-16: 2 mg via INTRAVENOUS

## 2014-04-16 MED ORDER — LIDOCAINE HCL (CARDIAC) 20 MG/ML IV SOLN
INTRAVENOUS | Status: DC | PRN
  Administered 2014-04-16: 60 mg via INTRAVENOUS

## 2014-04-16 MED ORDER — OXYCODONE-ACETAMINOPHEN 5-325 MG PO TABS
1.0000 | ORAL_TABLET | ORAL | Status: DC | PRN
  Administered 2014-04-16: 2 via ORAL
  Administered 2014-04-17: 1 via ORAL
  Filled 2014-04-16: qty 1
  Filled 2014-04-16: qty 2

## 2014-04-16 MED ORDER — FENTANYL CITRATE 0.05 MG/ML IJ SOLN
INTRAMUSCULAR | Status: DC | PRN
  Administered 2014-04-16: 50 ug via INTRAVENOUS
  Administered 2014-04-16 (×2): 100 ug via INTRAVENOUS

## 2014-04-16 MED ORDER — OLANZAPINE 10 MG PO TABS
10.0000 mg | ORAL_TABLET | Freq: Every day | ORAL | Status: DC
  Administered 2014-04-16: 10 mg via ORAL
  Filled 2014-04-16 (×2): qty 1

## 2014-04-16 SURGICAL SUPPLY — 39 items
APPLIER CLIP ROT 10 11.4 M/L (STAPLE) ×2
BENZOIN TINCTURE PRP APPL 2/3 (GAUZE/BANDAGES/DRESSINGS) ×2 IMPLANT
CANISTER SUCTION 2500CC (MISCELLANEOUS) IMPLANT
CHLORAPREP W/TINT 26ML (MISCELLANEOUS) ×2 IMPLANT
CLIP APPLIE ROT 10 11.4 M/L (STAPLE) ×1 IMPLANT
COVER MAYO STAND STRL (DRAPES) ×2 IMPLANT
DECANTER SPIKE VIAL GLASS SM (MISCELLANEOUS) ×2 IMPLANT
DRAPE C-ARM 42X120 X-RAY (DRAPES) ×2 IMPLANT
DRAPE LAPAROSCOPIC ABDOMINAL (DRAPES) ×2 IMPLANT
DRAPE UTILITY XL STRL (DRAPES) ×2 IMPLANT
DRSG TEGADERM 2-3/8X2-3/4 SM (GAUZE/BANDAGES/DRESSINGS) ×6 IMPLANT
DRSG TEGADERM 4X4.75 (GAUZE/BANDAGES/DRESSINGS) ×2 IMPLANT
ELECT REM PT RETURN 9FT ADLT (ELECTROSURGICAL) ×2
ELECTRODE REM PT RTRN 9FT ADLT (ELECTROSURGICAL) ×1 IMPLANT
FILTER SMOKE EVAC LAPAROSHD (FILTER) ×2 IMPLANT
GLOVE BIO SURGEON STRL SZ7 (GLOVE) ×2 IMPLANT
GLOVE BIOGEL PI IND STRL 7.5 (GLOVE) ×1 IMPLANT
GLOVE BIOGEL PI INDICATOR 7.5 (GLOVE) ×1
GOWN STRL REUS W/TWL LRG LVL3 (GOWN DISPOSABLE) ×2 IMPLANT
GOWN STRL REUS W/TWL XL LVL3 (GOWN DISPOSABLE) ×8 IMPLANT
KIT BASIN OR (CUSTOM PROCEDURE TRAY) ×2 IMPLANT
MANIFOLD NEPTUNE II (INSTRUMENTS) ×2 IMPLANT
NS IRRIG 1000ML POUR BTL (IV SOLUTION) ×2 IMPLANT
POUCH SPECIMEN RETRIEVAL 10MM (ENDOMECHANICALS) ×2 IMPLANT
RINGERS IRRIG 1000ML POUR BTL (IV SOLUTION) IMPLANT
SET CHOLANGIOGRAPH MIX (MISCELLANEOUS) ×2 IMPLANT
SET IRRIG TUBING LAPAROSCOPIC (IRRIGATION / IRRIGATOR) ×2 IMPLANT
SOLUTION ANTI FOG 6CC (MISCELLANEOUS) ×2 IMPLANT
STRIP CLOSURE SKIN 1/2X4 (GAUZE/BANDAGES/DRESSINGS) ×2 IMPLANT
SUT MNCRL AB 4-0 PS2 18 (SUTURE) ×2 IMPLANT
SUT VICRYL 0 UR6 27IN ABS (SUTURE) ×2 IMPLANT
SYR 20CC LL (SYRINGE) ×2 IMPLANT
TOWEL OR 17X26 10 PK STRL BLUE (TOWEL DISPOSABLE) ×2 IMPLANT
TOWEL OR NON WOVEN STRL DISP B (DISPOSABLE) ×2 IMPLANT
TRAY LAP CHOLE (CUSTOM PROCEDURE TRAY) ×2 IMPLANT
TROCAR BLADELESS OPT 5 75 (ENDOMECHANICALS) ×2 IMPLANT
TROCAR XCEL BLUNT TIP 100MML (ENDOMECHANICALS) ×2 IMPLANT
TROCAR XCEL NON-BLD 11X100MML (ENDOMECHANICALS) ×2 IMPLANT
TUBING INSUFFLATION 10FT LAP (TUBING) ×2 IMPLANT

## 2014-04-16 NOTE — Op Note (Signed)
Laparoscopic Cholecystectomy Procedure Note  Indications: This patient presents with symptomatic gallbladder disease and will undergo laparoscopic cholecystectomy.  Pre-operative Diagnosis: Calculus of gallbladder with other cholecystitis, without mention of obstruction  Post-operative Diagnosis: Same  Surgeon: [name]. Danise Dehne   Assistants: Dr. Neysa Bonito  Anesthesia: General endotracheal anesthesia  ASA Class: 2  Procedure Details  The patient was seen again in the Holding Room. The risks, benefits, complications, treatment options, and expected outcomes were discussed with the patient. The possibilities of reaction to medication, pulmonary aspiration, perforation of viscus, bleeding, recurrent infection, finding a normal gallbladder, the need for additional procedures, failure to diagnose a condition, the possible need to convert to an open procedure, and creating a complication requiring transfusion or operation were discussed with the patient. The likelihood of improving the patient's symptoms with return to their baseline status is good.  The patient and/or family concurred with the proposed plan, giving informed consent. The site of surgery properly noted. The patient was taken to Operating Room, identified as Alison [name] and the procedure verified as Laparoscopic Cholecystectomy with Intraoperative Cholangiogram. A Time Out was held and the above information confirmed.  Prior to the induction of general anesthesia, antibiotic prophylaxis was administered. General endotracheal anesthesia was then administered and tolerated well. After the induction, the abdomen was prepped with Chloraprep and draped in sterile fashion. The patient was positioned in the supine position.  Local anesthetic agent was injected into the skin near the umbilicus and an incision made through her old infraumbilical incision. We dissected down to the abdominal fascia with blunt dissection.  The fascia was  incised vertically and we entered the peritoneal cavity bluntly.  A pursestring suture of 0-Vicryl was placed around the fascial opening.  The Hasson cannula was inserted and secured with the stay suture.  Pneumoperitoneum was then created with CO2 and tolerated well without any adverse changes in the patient's vital signs. An 11-mm port was placed in the subxiphoid position.  Two 5-mm ports were placed in the right upper quadrant. All skin incisions were infiltrated with a local anesthetic agent before making the incision and placing the trocars.   We positioned the patient in reverse Trendelenburg, tilted slightly to the patient's left.  The gallbladder was identified and was very thickened and firm.  The fundus grasped with some difficulty and retracted cephalad. Adhesions were lysed bluntly and with the electrocautery where indicated, taking care not to injure any adjacent organs or viscus. The infundibulum was grasped and retracted laterally, exposing the peritoneum overlying the triangle of Calot. This was then divided and exposed in a blunt fashion. The cystic duct was clearly identified and bluntly dissected circumferentially. A critical view of the cystic duct and cystic artery was obtained.  We attempted a cholangiogram, but were unable to pass the catheter into the cystic duct.  No stones were noted in the cystic duct stump.  The cystic duct was then ligated with clips and divided. The cystic artery was, dissected free, ligated with clips and divided as well.   The gallbladder was dissected from the liver bed in retrograde fashion with the electrocautery. The gallbladder was removed and placed in an Endocatch sac. The liver bed was irrigated and inspected. Hemostasis was achieved with the electrocautery. Copious irrigation was utilized and was repeatedly aspirated until clear.  The gallbladder and Endocatch sac were then removed through the umbilical port site.  We had to enlarge the fascial opening  slightly to allow removal of the large hard gallbladder.  The pursestring suture was used to close the umbilical fascia.  We placed two additional 0 Vicryl sutures to complete the fascial closure  We again inspected the right upper quadrant for hemostasis.  Pneumoperitoneum was released as we removed the trocars.  4-0 Monocryl was used to close the skin.   Benzoin, steri-strips, and clean dressings were applied. The patient was then extubated and brought to the recovery room in stable condition. Instrument, sponge, and needle counts were correct at closure and at the conclusion of the case.   Findings: Cholecystitis with Cholelithiasis  Estimated Blood Loss: Less than 100 ml         Drains: none         Specimens: Gallbladder           Complications: None; patient tolerated the procedure well.         Disposition: PACU - hemodynamically stable.         Condition: stable  [name]. Georgette Dover, MD, [facility]  General/ Trauma Surgery  04/16/2014 3:26 PM

## 2014-04-16 NOTE — Discharge Instructions (Signed)
[facility] °LAPAROSCOPIC SURGERY: POST OP INSTRUCTIONS °Always review your discharge instruction sheet given to you by the facility where your surgery was performed. °IF YOU HAVE DISABILITY OR FAMILY LEAVE FORMS, YOU MUST BRING THEM TO THE OFFICE FOR PROCESSING.   °DO NOT GIVE THEM TO YOUR DOCTOR. ° °1. A prescription for pain medication will be given to you upon discharge.  Take your pain medication as prescribed, if needed.  If narcotic pain medicine is not needed, then you may take acetaminophen (Tylenol) or ibuprofen (Advil) as needed. °2. Take your usually prescribed medications unless otherwise directed. °3. If you need a refill on your pain medication, please contact your pharmacy.  They will contact our office to request authorization. Prescriptions will not be filled after 5pm or on week-ends. °4. You should follow a light diet the first few days after arrival home, such as soup and crackers, etc.  Be sure to include lots of fluids daily. °5. Most patients will experience some swelling and bruising in the area of the incisions.  Ice packs will help.  Swelling and bruising can take several days to resolve.  °6. It is common to experience some constipation if taking pain medication after surgery.  Increasing fluid intake and taking a stool softener (such as Colace) will usually help or prevent this problem from occurring.  A mild laxative (Milk of Magnesia or Miralax) should be taken according to package instructions if there are no bowel movements after 48 hours. °7. Unless discharge instructions indicate otherwise, you may remove your bandages 48 hours after surgery, and you may shower at that time.  You will have steri-strips (small skin tapes) in place directly over the incision.  These strips should be left on the skin for 7-10 days.  If your surgeon used skin glue on the incision, you may shower in 24 hours.  The glue will flake off over the next 2-3 weeks.  Any sutures or staples  will be removed at the office during your follow-up visit. °8. ACTIVITIES:  You may resume regular (light) daily activities beginning the next day--such as daily self-care, walking, climbing stairs--gradually increasing activities as tolerated.  You may have sexual intercourse when it is comfortable.  Refrain from any heavy lifting or straining until approved by your doctor. °a. You may drive when you are no longer taking prescription pain medication, you can comfortably wear a seatbelt, and you can safely maneuver your car and apply brakes. °b. RETURN TO WORK:   2-3 weeks °9. You should see your doctor in the office for a follow-up appointment approximately 2-3 weeks after your surgery.  Make sure that you call for this appointment within a day or two after you arrive home to insure a convenient appointment time. °10. OTHER INSTRUCTIONS: ________________________________________________________________________ °WHEN TO CALL YOUR DOCTOR: °1. Fever over 101.0 °2. Inability to urinate °3. Continued bleeding from incision. °4. Increased pain, redness, or drainage from the incision. °5. Increasing abdominal pain ° °The clinic staff is available to answer your questions during regular business hours.  Please don’t hesitate to call and ask to speak to one of the nurses for clinical concerns.  If you have a medical emergency, go to the nearest emergency room or call 911.  A surgeon from [facility] is always on call at the hospital. °[address], [address], [place], [place]  [address] ? [address] [address], [place], [place]   [address] °[phone] ? [phone] ? FAX [phone] °Web site:   [email or web address] ° °

## 2014-04-16 NOTE — Anesthesia Preprocedure Evaluation (Signed)
Anesthesia Evaluation  Patient identified by MRN, date of birth, ID band Patient awake    Reviewed: Allergy & Precautions, H&P , NPO status , Patient's Chart, lab work & pertinent test results  Airway Mallampati: II TM Distance: >3 FB Neck ROM: Full    Dental no notable dental hx. (+) Poor Dentition   Pulmonary neg pulmonary ROS, former smoker,  breath sounds clear to auscultation  Pulmonary exam normal       Cardiovascular hypertension, Pt. on medications negative cardio ROS  Rhythm:Regular Rate:Normal     Neuro/Psych Schizophrenia negative neurological ROS  negative psych ROS   GI/Hepatic negative GI ROS, Neg liver ROS,   Endo/Other  negative endocrine ROSdiabetes, Type 2, Oral Hypoglycemic Agents  Renal/GU negative Renal ROS  negative genitourinary   Musculoskeletal negative musculoskeletal ROS (+)   Abdominal   Peds negative pediatric ROS (+)  Hematology negative hematology ROS (+)   Anesthesia Other Findings   Reproductive/Obstetrics negative OB ROS                           Anesthesia Physical Anesthesia Plan  ASA: II  Anesthesia Plan: General   Post-op Pain Management:    Induction: Intravenous  Airway Management Planned: Oral ETT  Additional Equipment:   Intra-op Plan:   Post-operative Plan: Extubation in OR  Informed Consent: I have reviewed the patients History and Physical, chart, labs and discussed the procedure including the risks, benefits and alternatives for the proposed anesthesia with the patient or authorized representative who has indicated his/her understanding and acceptance.   Dental advisory given  Plan Discussed with: CRNA  Anesthesia Plan Comments:         Anesthesia Quick Evaluation

## 2014-04-16 NOTE — H&P (View-Only) (Signed)
Patient ID: Alison [name], female   DOB: [birth year], 53 y.o.   MRN: [id number]  Chief Complaint  Patient presents with  . eval gallstones    new pt    HPI Alison [name] is a 53 y.o. female.  Referred by Dr. Lucianne Lei for symptomatic gallbladder disease  HPI  Past Medical History  Diagnosis Date  . Diabetes mellitus without complication   Schizophrenia Hyperlipidemia Hypertension GERD Chronic anemia  PSH - Laparoscopic-assisted vaginal hysterectomy  FH:  Mother - breast cancer  Social History History  Substance Use Topics  . Smoking status: Never Smoker   . Smokeless tobacco: Not on file  . Alcohol Use: No    No Known Allergies  Prior to Admission medications   Medication Sig Start Date End Date Taking? Authorizing Provider  acetaminophen (TYLENOL) 325 MG tablet Take 650 mg by mouth every 6 (six) hours as needed.   Yes Historical Provider, MD  clotrimazole-betamethasone (LOTRISONE) cream Apply 1 application topically 2 (two) times daily.   Yes Historical Provider, MD  lisinopril (PRINIVIL,ZESTRIL) 10 MG tablet Take 10 mg by mouth daily.   Yes Historical Provider, MD  terconazole (TERAZOL 7) 0.4 % vaginal cream Place 1 applicator vaginally at bedtime.   Yes Historical Provider, MD   Depakote   Review of Systems Review of Systems  Constitutional: Negative for fever, chills and unexpected weight change.  HENT: Negative for congestion, hearing loss, sore throat, trouble swallowing and voice change.   Eyes: Negative for visual disturbance.  Respiratory: Negative for cough and wheezing.   Cardiovascular: Negative for chest pain, palpitations and leg swelling.  Gastrointestinal: Positive for abdominal pain and abdominal distention. Negative for nausea, vomiting, diarrhea, constipation, blood in stool and anal bleeding.  Genitourinary: Negative for hematuria, vaginal bleeding and difficulty urinating.  Musculoskeletal: Negative for arthralgias.  Skin: Negative for  rash and wound.  Neurological: Negative for seizures, syncope and headaches.  Hematological: Negative for adenopathy. Does not bruise/bleed easily.  Psychiatric/Behavioral: Negative for confusion.    Blood pressure 122/78, pulse 76, temperature 97 F (36.1 C), height 5\' 2"  (1.575 m), weight 182 lb (82.555 kg).  Physical Exam Physical Exam WDWN in NAD HEENT:  EOMI, sclera anicteric Neck:  No masses, no thyromegaly Lungs:  CTA bilaterally; normal respiratory effort CV:  Regular rate and rhythm; no murmurs Abd:  +bowel sounds, soft, mildly tender in RUQ Ext:  Well-perfused; no edema Skin:  Warm, dry; no sign of jaundice  Data Reviewed US Abdomen Complete  03/14/2014   CLINICAL DATA:  Abdominal pain. Right upper quadrant and left lower quadrant pain.  EXAM: ULTRASOUND ABDOMEN COMPLETE  COMPARISON:  CT ABD W/CM dated 12/28/2007; CT PELVIS W/CM dated 12/28/2007  FINDINGS: Gallbladder:  The wall echo shadow complex is identified in the gallbladder fossa, consistent with gallbladder distended by numerous gallstones. Gallbladder wall is not well evaluated because of the numerous stones. No pericholecystic fluid or sonographic Murphy's sign.  Common bile duct:  Diameter: 6.8 mm  Liver:  Echogenic without focal lesion  IVC:  No abnormality visualized.  Pancreas:  Visualized portion unremarkable.  Spleen:  Normal in appearance, 6.8 cm  Right Kidney:  Length: 12.4 cm. Echogenicity within normal limits. No mass or hydronephrosis visualized.  Left Kidney:  Length: 12.2 cm and. Echogenicity within normal limits. No mass or hydronephrosis visualized.  Abdominal aorta:  Aortic bifurcation evaluation is limited because of overlying bowel loops. The visualized portion of the aorta is not aneurysmal.  Other findings:  None.  IMPRESSION:  1. Gallbladder contains numerous gallstones. These are not measurable because of the associated shadowing. 2. Gallbladder wall is not well evaluated because of the large number of  stones. 3. Positive sonographic Murphy sign. Findings are consistent with acute cholecystitis. 4. These results will be called to the ordering clinician or representative by the Radiologist Assistant, and communication documented in the PACS Dashboard.   Electronically Signed   By: Shon Hale M.D.   On: 03/14/2014 08:32      Assessment    Chronic calculus cholecystitis      Plan    Laparoscopic cholecystectomy with intraoperative cholangiogram.  The surgical procedure has been discussed with the patient.  Potential risks, benefits, alternative treatments, and expected outcomes have been explained.  All of the patient's questions at this time have been answered.  The likelihood of reaching the patient's treatment goal is good.  The patient understand the proposed surgical procedure and wishes to proceed.         [name]. Marquize Seib 04/04/2014, 9:24 AM

## 2014-04-16 NOTE — Transfer of Care (Signed)
Immediate Anesthesia Transfer of Care Note  Patient: Alison [name]  Procedure(s) Performed: Procedure(s): LAPAROSCOPIC CHOLECYSTECTOMY  (N/A)  Patient Location: PACU  Anesthesia Type:General  Level of Consciousness: sedated, patient cooperative and responds to stimulation  Airway & Oxygen Therapy: Patient Spontanous Breathing and Patient connected to face mask oxygen  Post-op Assessment: Report given to PACU RN and Post -op Vital signs reviewed and stable  Post vital signs: Reviewed and stable  Complications: No apparent anesthesia complications

## 2014-04-16 NOTE — Anesthesia Postprocedure Evaluation (Signed)
  Anesthesia Post-op Note  Patient: Alison [name]  Procedure(s) Performed: Procedure(s) (LRB): LAPAROSCOPIC CHOLECYSTECTOMY  (N/A)  Patient Location: PACU  Anesthesia Type: General  Level of Consciousness: awake and alert   Airway and Oxygen Therapy: Patient Spontanous Breathing  Post-op Pain: mild  Post-op Assessment: Post-op Vital signs reviewed, Patient's Cardiovascular Status Stable, Respiratory Function Stable, Patent Airway and No signs of Nausea or vomiting  Last Vitals:  Filed Vitals:   04/16/14 1722  BP: 167/97  Pulse: 81  Temp: 36.4 C  Resp: 18    Post-op Vital Signs: stable   Complications: No apparent anesthesia complications

## 2014-04-16 NOTE — Interval H&P Note (Signed)
History and Physical Interval Note:  04/16/2014 11:26 AM  Alison [name]  has presented today for surgery, with the diagnosis of cholecystitis   The various methods of treatment have been discussed with the patient and family. After consideration of risks, benefits and other options for treatment, the patient has consented to  Procedure(s): LAPAROSCOPIC CHOLECYSTECTOMY WITH INTRAOPERATIVE CHOLANGIOGRAM (N/A) as a surgical intervention .  The patient's history has been reviewed, patient examined, no change in status, stable for surgery.  I have reviewed the patient's chart and labs.  Questions were answered to the patient's satisfaction.     [name]. Gladstone Rosas

## 2014-04-17 ENCOUNTER — Encounter (HOSPITAL_COMMUNITY): Payer: Self-pay | Admitting: Surgery

## 2014-04-17 LAB — COMPREHENSIVE METABOLIC PANEL
ALT: 66 U/L — ABNORMAL HIGH (ref 0–35)
AST: 59 U/L — ABNORMAL HIGH (ref 0–37)
Albumin: 3.3 g/dL — ABNORMAL LOW (ref 3.5–5.2)
Alkaline Phosphatase: 76 U/L (ref 39–117)
BUN: 9 mg/dL (ref 6–23)
CO2: 25 mEq/L (ref 19–32)
Calcium: 8.8 mg/dL (ref 8.4–10.5)
Chloride: 100 mEq/L (ref 96–112)
Creatinine, Ser: 0.62 mg/dL (ref 0.50–1.10)
GFR calc Af Amer: >90 mL/min (ref >90)
GFR calc non Af Amer: >90 mL/min (ref >90)
Glucose, Bld: 248 mg/dL — ABNORMAL HIGH (ref 70–99)
Potassium: 4.4 mEq/L (ref 3.7–5.3)
Sodium: 141 mEq/L (ref 137–147)
Total Bilirubin: <0.2 mg/dL — ABNORMAL LOW (ref 0.3–1.2)
Total Protein: 6.8 g/dL (ref 6.0–8.3)

## 2014-04-17 LAB — CBC
HCT: 33.5 % — ABNORMAL LOW (ref 36.0–46.0)
Hemoglobin: 11.3 g/dL — ABNORMAL LOW (ref 12.0–15.0)
MCH: 28.6 pg (ref 26.0–34.0)
MCHC: 33.7 g/dL (ref 30.0–36.0)
MCV: 84.8 fL (ref 78.0–100.0)
Platelets: 234 10*3/uL (ref 150–400)
RBC: 3.95 MIL/uL (ref 3.87–5.11)
RDW: 12.8 % (ref 11.5–15.5)
WBC: 7.7 10*3/uL (ref 4.0–10.5)

## 2014-04-17 LAB — GLUCOSE, CAPILLARY: Glucose-Capillary: 187 mg/dL — ABNORMAL HIGH (ref 70–99)

## 2014-04-17 NOTE — Discharge Summary (Signed)
Physician Discharge Summary  Patient ID: Alison [name] MRN: [id number] DOB/AGE: 53-Jul-1962 53 y.o.  Admit date: 04/16/2014 Discharge date: 04/17/2014  Admission Diagnoses:  Chronic calculus cholecystitis  Discharge Diagnoses:   Same Active Problems:   Chronic cholecystitis with calculus   Discharged Condition: good  Hospital Course: Laparoscopic cholecystectomy on 04/16/14.  Patient woke up from anesthesia very slowly and had some low oxygen saturations, so we kept her overnight for observation and oxygen.  Feeling better this morning with oxygen saturations in the mid 90's on room air  Consults: None  Significant Diagnostic Studies: labs:  Lab Results  Component Value Date   WBC 7.7 04/17/2014   HGB 11.3* 04/17/2014   HCT 33.5* 04/17/2014   MCV 84.8 04/17/2014   PLT 234 04/17/2014   Lab Results  Component Value Date   CREATININE 0.62 04/17/2014   BUN 9 04/17/2014   NA 141 04/17/2014   K 4.4 04/17/2014   CL 100 04/17/2014   CO2 25 04/17/2014   Lab Results  Component Value Date   ALT 66* 04/17/2014   AST 59* 04/17/2014   ALKPHOS 76 04/17/2014   BILITOT <0.2* 04/17/2014     Treatments: surgery: Lap chole  Discharge Exam: Blood pressure 140/80, pulse 109, temperature 98.1 F (36.7 C), temperature source Oral, resp. rate 18, height 5\' 3"  (1.6 m), weight 189 lb (85.73 kg), SpO2 96.00%. General appearance: alert, cooperative and no distress GI: mildly distended; incisional tenderness Incision/Wound:  Umbilical dressing with some pink staining; otherwise c/d/i   Disposition: Discharge home  Discharge Orders   Future Orders Complete By Expires   Call MD for:  persistant nausea and vomiting  As directed    Call MD for:  persistant nausea and vomiting  As directed    Call MD for:  redness, tenderness, or signs of infection (pain, swelling, redness, odor or green/yellow discharge around incision site)  As directed    Call MD for:  redness, tenderness, or signs of infection (pain, swelling,  redness, odor or green/yellow discharge around incision site)  As directed    Call MD for:  severe uncontrolled pain  As directed    Call MD for:  severe uncontrolled pain  As directed    Call MD for:  temperature >100.4  As directed    Call MD for:  temperature >100.4  As directed    Diet general  As directed    Diet general  As directed    Driving Restrictions  As directed    Driving Restrictions  As directed    Increase activity slowly  As directed    Increase activity slowly  As directed    May shower / Bathe  As directed    May shower / Bathe  As directed    May walk up steps  As directed    May walk up steps  As directed        Medication List         acetaminophen 325 MG tablet  Commonly known as:  TYLENOL  Take 650 mg by mouth every 4 (four) hours as needed for mild pain.     benztropine 0.5 MG tablet  Commonly known as:  COGENTIN  Take 0.5 mg by mouth 2 (two) times daily.     divalproex 500 MG 24 hr tablet  Commonly known as:  DEPAKOTE ER  Take 1,000 mg by mouth at bedtime.     furosemide 40 MG tablet  Commonly known as:  LASIX  Take 40 mg by mouth every morning.     lisinopril 10 MG tablet  Commonly known as:  PRINIVIL,ZESTRIL  Take 10 mg by mouth every morning.     metFORMIN 500 MG tablet  Commonly known as:  GLUCOPHAGE  Take by mouth 2 (two) times daily with a meal.     OLANZapine 10 MG tablet  Commonly known as:  ZYPREXA  Take 10 mg by mouth at bedtime.     omeprazole 20 MG capsule  Commonly known as:  PRILOSEC  Take 40 mg by mouth every morning.     oxyCODONE-acetaminophen 5-325 MG per tablet  Commonly known as:  PERCOCET/ROXICET  Take 1 tablet by mouth every 4 (four) hours as needed for severe pain.     simvastatin 10 MG tablet  Commonly known as:  ZOCOR  Take 10 mg by mouth at bedtime.     SYSTANE 0.4-0.3 % Soln  Generic drug:  Polyethyl Glycol-Propyl Glycol  Apply 1 drop to eye 4 (four) times daily.           Follow-up Information    Follow up with Eun Vermeer K., MD In 3 weeks.   Specialty:  General Surgery   Contact information:   [address] [place] [address] [phone]       Signed: [name]. Jaylenn Altier 04/17/2014, 7:10 AM

## 2014-04-17 NOTE — Progress Notes (Signed)
Patient is alert and oriented, vital signs are stable, incisions are within normal limits, patient is tolerating her diet without complaints of nausea or vomiting, will continue to monitor, patient to follow up with MD Neta Mends RN 04-17-2014 10:33am

## 2014-04-21 ENCOUNTER — Telehealth (INDEPENDENT_AMBULATORY_CARE_PROVIDER_SITE_OTHER): Payer: Self-pay | Admitting: General Surgery

## 2014-04-21 NOTE — Telephone Encounter (Signed)
Called N/A patient will need to an follow up apt with Dr Georgette Dover in 2 to 3 weeks . Can call and ask for [facility]

## 2014-04-30 ENCOUNTER — Telehealth (INDEPENDENT_AMBULATORY_CARE_PROVIDER_SITE_OTHER): Payer: Self-pay | Admitting: General Surgery

## 2014-04-30 NOTE — Telephone Encounter (Signed)
Called patient back to let her know that she can go back to her program (classes) she wants to go back to

## 2014-04-30 NOTE — Telephone Encounter (Signed)
Message copied by Maryclare Bean on Wed Apr 30, 2014 10:06 AM ------      Message from: Aviva Signs      Created: Wed Apr 30, 2014  9:33 AM       Pt would like to know if she can go back to her day program.please return her call at [phone]  ------

## 2014-05-21 ENCOUNTER — Encounter (INDEPENDENT_AMBULATORY_CARE_PROVIDER_SITE_OTHER): Payer: Self-pay

## 2014-05-22 ENCOUNTER — Ambulatory Visit (INDEPENDENT_AMBULATORY_CARE_PROVIDER_SITE_OTHER): Payer: Medicaid Other | Admitting: Surgery

## 2014-05-22 ENCOUNTER — Encounter (INDEPENDENT_AMBULATORY_CARE_PROVIDER_SITE_OTHER): Payer: Self-pay | Admitting: Surgery

## 2014-05-22 VITALS — BP 110/75 | HR 81 | Temp 98.4°F | Resp 16 | Ht 60.5 in | Wt 186.4 lb

## 2014-05-22 DIAGNOSIS — K8010 Calculus of gallbladder with chronic cholecystitis without obstruction: Secondary | ICD-10-CM

## 2014-05-23 ENCOUNTER — Encounter (INDEPENDENT_AMBULATORY_CARE_PROVIDER_SITE_OTHER): Payer: Self-pay | Admitting: Surgery

## 2014-05-23 NOTE — Progress Notes (Signed)
Status post laparoscopic cholecystectomy with intraoperative cholangiogram on 04/16/14. The patient had a very thickened gallbladder but the pathology showed only chronic calculus cholecystitis. The patient seems to be doing quite well. She states that her appetite and bowel movements are normal. She is not having any problems with diarrhea. Her incisions all seem to be healing well with no sign of infection. No abdominal tenderness.  There are no limits on her diet or activity. Followup when necessary.  Alison [name]. Georgette Dover, MD, [facility]  General/ Trauma Surgery  05/23/2014 8:59 AM

## 2014-09-03 ENCOUNTER — Other Ambulatory Visit: Payer: Self-pay

## 2014-09-03 DIAGNOSIS — Z1231 Encounter for screening mammogram for malignant neoplasm of breast: Secondary | ICD-10-CM

## 2014-10-01 ENCOUNTER — Ambulatory Visit
Admission: RE | Admit: 2014-10-01 | Discharge: 2014-10-01 | Disposition: A | Discharge status: Home / Self Care | Payer: Medicaid Other | Source: Ambulatory Visit

## 2014-10-01 DIAGNOSIS — Z1231 Encounter for screening mammogram for malignant neoplasm of breast: Secondary | ICD-10-CM

## 2015-08-27 ENCOUNTER — Other Ambulatory Visit: Payer: Self-pay

## 2015-08-27 DIAGNOSIS — Z1231 Encounter for screening mammogram for malignant neoplasm of breast: Secondary | ICD-10-CM

## 2015-10-06 ENCOUNTER — Ambulatory Visit: Payer: Medicaid Other

## 2015-10-15 ENCOUNTER — Ambulatory Visit: Payer: Medicaid Other

## 2015-10-27 ENCOUNTER — Ambulatory Visit
Admission: RE | Admit: 2015-10-27 | Discharge: 2015-10-27 | Disposition: A | Discharge status: Home / Self Care | Payer: Medicaid Other | Source: Ambulatory Visit

## 2015-10-27 DIAGNOSIS — Z1231 Encounter for screening mammogram for malignant neoplasm of breast: Secondary | ICD-10-CM

## 2015-10-29 ENCOUNTER — Other Ambulatory Visit: Payer: Self-pay | Admitting: Family Medicine

## 2015-10-29 DIAGNOSIS — R928 Other abnormal and inconclusive findings on diagnostic imaging of breast: Secondary | ICD-10-CM

## 2015-11-04 ENCOUNTER — Other Ambulatory Visit: Payer: Medicaid Other

## 2015-11-19 ENCOUNTER — Other Ambulatory Visit: Payer: Medicaid Other

## 2015-11-25 ENCOUNTER — Other Ambulatory Visit: Payer: Self-pay | Admitting: Family Medicine

## 2015-11-25 ENCOUNTER — Ambulatory Visit
Admission: RE | Admit: 2015-11-25 | Discharge: 2015-11-25 | Disposition: A | Discharge status: Home / Self Care | Payer: Medicaid Other | Source: Ambulatory Visit | Attending: Family Medicine | Admitting: Family Medicine

## 2015-11-25 DIAGNOSIS — R928 Other abnormal and inconclusive findings on diagnostic imaging of breast: Secondary | ICD-10-CM

## 2015-11-25 DIAGNOSIS — N6310 Unspecified lump in the right breast, unspecified quadrant: Secondary | ICD-10-CM

## 2015-12-02 ENCOUNTER — Other Ambulatory Visit: Payer: Medicaid Other

## 2015-12-04 ENCOUNTER — Ambulatory Visit
Admission: RE | Admit: 2015-12-04 | Discharge: 2015-12-04 | Disposition: A | Discharge status: Home / Self Care | Payer: Medicaid Other | Source: Ambulatory Visit | Attending: Family Medicine | Admitting: Family Medicine

## 2015-12-04 ENCOUNTER — Other Ambulatory Visit: Payer: Self-pay | Admitting: Family Medicine

## 2015-12-04 DIAGNOSIS — N6310 Unspecified lump in the right breast, unspecified quadrant: Secondary | ICD-10-CM

## 2015-12-10 ENCOUNTER — Other Ambulatory Visit: Payer: Medicaid Other

## 2015-12-13 HISTORY — PX: BREAST LUMPECTOMY: SHX2

## 2015-12-15 ENCOUNTER — Other Ambulatory Visit: Payer: Self-pay | Admitting: Surgery

## 2015-12-16 ENCOUNTER — Telehealth: Payer: Self-pay | Admitting: Hematology and Oncology

## 2015-12-16 NOTE — Telephone Encounter (Signed)
new patient appt-s/w patient mother Margreta Journey and gave np appt for 01/12 @ 1 w/Dr. Lindi Adie, 01/18 @ 2 w/Karen Florene Glen Referring Dr. Ninfa Linden  Referral information scanned

## 2015-12-18 ENCOUNTER — Telehealth: Payer: Self-pay | Admitting: *Deleted

## 2015-12-18 NOTE — Telephone Encounter (Signed)
Mailed new pt packet to pt.

## 2015-12-24 ENCOUNTER — Encounter: Payer: Self-pay | Admitting: Hematology and Oncology

## 2015-12-24 ENCOUNTER — Ambulatory Visit: Payer: Medicaid Other

## 2015-12-24 ENCOUNTER — Encounter: Payer: Self-pay | Admitting: *Deleted

## 2015-12-24 ENCOUNTER — Encounter: Payer: Self-pay | Admitting: Genetic Counselor

## 2015-12-24 ENCOUNTER — Ambulatory Visit (HOSPITAL_BASED_OUTPATIENT_CLINIC_OR_DEPARTMENT_OTHER): Payer: Medicaid Other | Admitting: Hematology and Oncology

## 2015-12-24 ENCOUNTER — Ambulatory Visit: Payer: Medicaid Other | Admitting: Genetic Counselor

## 2015-12-24 VITALS — BP 121/78 | HR 96 | Temp 98.0°F | Resp 20 | Wt 184.5 lb

## 2015-12-24 DIAGNOSIS — Z808 Family history of malignant neoplasm of other organs or systems: Secondary | ICD-10-CM

## 2015-12-24 DIAGNOSIS — Z171 Estrogen receptor negative status [ER-]: Secondary | ICD-10-CM

## 2015-12-24 DIAGNOSIS — C773 Secondary and unspecified malignant neoplasm of axilla and upper limb lymph nodes: Secondary | ICD-10-CM | POA: Diagnosis not present

## 2015-12-24 DIAGNOSIS — C50411 Malignant neoplasm of upper-outer quadrant of right female breast: Secondary | ICD-10-CM | POA: Diagnosis present

## 2015-12-24 DIAGNOSIS — C50919 Malignant neoplasm of unspecified site of unspecified female breast: Secondary | ICD-10-CM

## 2015-12-24 DIAGNOSIS — Z8041 Family history of malignant neoplasm of ovary: Secondary | ICD-10-CM | POA: Insufficient documentation

## 2015-12-24 DIAGNOSIS — Z803 Family history of malignant neoplasm of breast: Secondary | ICD-10-CM | POA: Insufficient documentation

## 2015-12-24 DIAGNOSIS — Z809 Family history of malignant neoplasm, unspecified: Secondary | ICD-10-CM

## 2015-12-24 DIAGNOSIS — Z800 Family history of malignant neoplasm of digestive organs: Secondary | ICD-10-CM

## 2015-12-24 DIAGNOSIS — Z8042 Family history of malignant neoplasm of prostate: Secondary | ICD-10-CM

## 2015-12-24 NOTE — Assessment & Plan Note (Signed)
Right breast biopsy 10:00 on 12/04/2015: Invasive ductal carcinoma with DCIS, grade 2, ER 0%, PR 0%, HER-2 negative, ratio1.31, Ki-67 40% Right breast mass 12 cm from the nipple: 1.4 x 1.3 x 0.9 cm, two right axillary lymph nodes with borderline cortical thickening measuring up to 3 mm  Clinical stage: T1c N0 stage IA  Pathology and radiology counseling:Discussed with the patient, the details of pathology including the type of breast cancer,the clinical staging, the significance of ER, PR and HER-2/neu receptors and the implications for treatment. After reviewing the pathology in detail, we proceeded to discuss the different treatment options between surgery, radiation, chemotherapy, antiestrogen therapies.  Recommendation based multidisciplinary tumor board: 1. Genetics counseling 2. Breast conserving surgery with sentinel lymph node biopsy 3. Followed by adjuvant chemotherapy 4. Followed by radiation  I will request a port placement during her lumpectomy. Postoperatively she will need echocardiogram, chemotherapy class and we will plan to start adjuvant chemotherapy approximately one month after she completes her lumpectomy.

## 2015-12-24 NOTE — Progress Notes (Signed)
[facility] CONSULT NOTE  CHIEF COMPLAINTS/PURPOSE OF CONSULTATION:  Newly diagnosed breast cancer  HISTORY OF PRESENTING ILLNESS:  Alison [name] 55 y.o. female is here because of recent diagnosis of right breast cancer.she had a routine screening mammogram that revealed abnormality in the right breast. She underwent right breast biopsy which revealed invasive ductal carcinoma with DCIS was grade 2 and it was triple negative with a Ki-67 40%. She was seen by Dr. Ninfa Linden who recommended lumpectomy. She was presented at the multidisciplinary tumor board and the recommended adjuvant systemic chemotherapy. She is here today accompanied by her mother who is a 39 year old breast cancer survivor. She had been through surgery chemo and radiation the past.  Patient lives in a group home and is slightly mentally challenged. Her mother is her main support. She has a 63 year old daughter.  I reviewed her records extensively and collaborated the history with the patient.  SUMMARY OF ONCOLOGIC HISTORY:   Breast cancer of upper-outer quadrant of right female breast ([facility])   11/25/2015 Mammogram Right breast mass 12 cm from the nipple: 1.4 x 1.3 x 0.9 cm, two right axillary lymph nodes with borderline cortical thickening measuring up to 3 mm   12/04/2015 Initial Diagnosis Right breast biopsy 10:00: Invasive ductal carcinoma with DCIS, grade 2, ER 0%, PR 0%, HER-2 negative, ratio1.31, Ki-67 40%   MEDICAL HISTORY:  Past Medical History  Diagnosis Date  . Diabetes mellitus without complication   . Hyperlipidemia   . Anemia   . GERD (gastroesophageal reflux disease)   . Cancer   . Hypertension   . Gallstones   . Schizophrenia     SURGICAL HISTORY: Past Surgical History  Procedure Laterality Date  . Abdominal hysterectomy    . Cholecystectomy N/A 04/16/2014    Procedure: LAPAROSCOPIC CHOLECYSTECTOMY ;  Surgeon: [name]. Georgette Dover, MD;  Location: [facility] ORS;  Service: General;  Laterality: N/A;     SOCIAL HISTORY: Social History   Social History  . Marital Status: Single    Spouse Name: N/A  . Number of Children: N/A  . Years of Education: N/A   Occupational History  . Not on file.   Social History Main Topics  . Smoking status: Former Smoker    Quit date: 10/08/2013  . Smokeless tobacco: Not on file  . Alcohol Use: No  . Drug Use: No  . Sexual Activity: Not on file   Other Topics Concern  . Not on file   Social History Narrative  . No narrative on file    FAMILY HISTORY: No family history on file.  ALLERGIES:  has No Known Allergies.  MEDICATIONS:  Current Outpatient Prescriptions  Medication Sig Dispense Refill  . acetaminophen (TYLENOL) 325 MG tablet Take 650 mg by mouth every 4 (four) hours as needed for mild pain.     . benztropine (COGENTIN) 0.5 MG tablet Take 0.5 mg by mouth 2 (two) times daily.    . divalproex (DEPAKOTE ER) 500 MG 24 hr tablet Take 1,000 mg by mouth at bedtime.    . furosemide (LASIX) 40 MG tablet Take 40 mg by mouth every morning.    Marland Kitchen lisinopril (PRINIVIL,ZESTRIL) 10 MG tablet Take 10 mg by mouth every morning.     . metFORMIN (GLUCOPHAGE) 500 MG tablet Take by mouth 2 (two) times daily with a meal.    . OLANZapine (ZYPREXA) 10 MG tablet Take 10 mg by mouth at bedtime.    Marland Kitchen omeprazole (PRILOSEC) 20 MG capsule Take 40 mg  by mouth every morning.    Marland Kitchen oxyCODONE-acetaminophen (PERCOCET/ROXICET) 5-325 MG per tablet Take 1 tablet by mouth every 4 (four) hours as needed for severe pain. 40 tablet 0  . Polyethyl Glycol-Propyl Glycol (SYSTANE) 0.4-0.3 % SOLN Apply 1 drop to eye 4 (four) times daily.    . simvastatin (ZOCOR) 10 MG tablet Take 10 mg by mouth at bedtime.     No current facility-administered medications for this visit.    REVIEW OF SYSTEMS:   Constitutional: Denies fevers, chills or abnormal night sweats Eyes: Denies blurriness of vision, double vision or watery eyes Ears, nose, mouth, throat, and face: Denies mucositis  or sore throat Respiratory: Denies cough, dyspnea or wheezes Cardiovascular: Denies palpitation, chest discomfort or lower extremity swelling Gastrointestinal:  Denies nausea, heartburn or change in bowel habits Skin: Denies abnormal skin rashes Lymphatics: Denies new lymphadenopathy or easy bruising Neurological:Denies numbness, tingling or new weaknesses Behavioral/Psych: slightly mentally challenged Breast:  Denies any palpable lumps or discharge All other systems were reviewed with the patient and are negative.  PHYSICAL EXAMINATION: ECOG PERFORMANCE STATUS: 1 - Symptomatic but completely ambulatory  Filed Vitals:   12/24/15 1241  BP: 121/78  Pulse: 96  Temp: 98 F (36.7 C)  Resp: 20   Filed Weights   12/24/15 1241  Weight: 184 lb 8 oz (83.689 kg)    GENERAL:alert, no distress and comfortable SKIN: skin color, texture, turgor are normal, no rashes or significant lesions EYES: normal, conjunctiva are pink and non-injected, sclera clear OROPHARYNX:no exudate, no erythema and lips, buccal mucosa, and tongue normal  NECK: supple, thyroid normal size, non-tender, without nodularity LYMPH:  no palpable lymphadenopathy in the cervical, axillary or inguinal LUNGS: clear to auscultation and percussion with normal breathing effort HEART: regular rate & rhythm and no murmurs and no lower extremity edema ABDOMEN:abdomen soft, non-tender and normal bowel sounds Musculoskeletal:no cyanosis of digits and no clubbing  PSYCH: alert & oriented x 3 with fluent speech NEURO: no focal motor/sensory deficits  LABORATORY DATA:  I have reviewed the data as listed Lab Results  Component Value Date   WBC 7.7 04/17/2014   HGB 11.3* 04/17/2014   HCT 33.5* 04/17/2014   MCV 84.8 04/17/2014   PLT 234 04/17/2014   Lab Results  Component Value Date   NA 141 04/17/2014   K 4.4 04/17/2014   CL 100 04/17/2014   CO2 25 04/17/2014    ASSESSMENT AND PLAN:  Breast cancer of upper-outer  quadrant of right female breast ([facility]) Right breast biopsy 10:00 on 12/04/2015: Invasive ductal carcinoma with DCIS, grade 2, ER 0%, PR 0%, HER-2 negative, ratio1.31, Ki-67 40% Right breast mass 12 cm from the nipple: 1.4 x 1.3 x 0.9 cm, two right axillary lymph nodes with borderline cortical thickening measuring up to 3 mm  Clinical stage: T1c N0 stage IA  Pathology and radiology counseling:Discussed with the patient, the details of pathology including the type of breast cancer,the clinical staging, the significance of ER, PR and HER-2/neu receptors and the implications for treatment. After reviewing the pathology in detail, we proceeded to discuss the different treatment options between surgery, radiation, chemotherapy, antiestrogen therapies.  Recommendation based multidisciplinary tumor board: 1. Genetics counseling 2. Breast conserving surgery with sentinel lymph node biopsy 3. Followed by adjuvant chemotherapy (Adriamycin and Cytoxan dose dense 4 followed by Abraxane weekly 12) 4. Followed by radiation  I will request a port placement during her lumpectomy. Postoperatively she will need echocardiogram, chemotherapy class and we will plan to  start adjuvant chemotherapy approximately one month after she completes her lumpectomy.  Return to clinic 1 week after surgery.  All questions were answered. The patient knows to call the clinic with any problems, questions or concerns.    Rulon Eisenmenger, MD 12/24/2015

## 2015-12-24 NOTE — Progress Notes (Signed)
REFERRING PROVIDER: Serena Croissant, MD [address] [place], [place] [address]  PRIMARY PROVIDER:  No primary care provider on file.  PRIMARY REASON FOR VISIT:  1. Breast cancer of upper-outer quadrant of right female breast (HCC)   2. 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 Family history of breast cancer in [name]   4. Family history of ovarian cancer   5. Family history of prostate cancer   6. Family history of skin cancer   7. Family history of colon cancer   8. Family history of stomach cancer   9. Family history of cancer      HISTORY OF PRESENT ILLNESS:   Alison [name], a 55 y.o. female, was seen for a [place] cancer genetics consultation at the request of Dr. Pamelia [name] due to a personal history of triple negative breast cancer at 53 and family history of breast, prostate, ovarian, and other cancers.  Alison [name] presents to clinic today with her [name] to discuss the possibility of a hereditary predisposition to cancer, genetic testing, and to further clarify her future cancer risks, as well as potential cancer risks for family members.   In December 2016, at the age of 31, Alison [name] was diagnosed with invasive ductal carcinoma of the right breast.  Hormone receptor status was triple negative. Surgical and treatment decisions will be determined pending genetic test results.    CANCER HISTORY:    Breast cancer of upper-outer quadrant of right female breast (HCC)   11/25/2015 Mammogram Right breast mass 12 cm from the nipple: 1.4 x 1.3 x 0.9 cm, two right axillary lymph nodes with borderline cortical thickening measuring up to 3 mm   12/04/2015 Initial Diagnosis Right breast biopsy 10:00: Invasive ductal carcinoma with DCIS, grade 2, ER 0%, PR 0%, HER-2 negative, ratio1.31, Ki-67 40%     HORMONAL RISK FACTORS:  Menarche was at age 26 (per patient report, but patient's mom thinks it was later).  First live birth at age 61.  OCP use for -  not for very long   Ovaries intact: yes (patient thinks she still has) Hysterectomy: yes Menopausal status: postmenopausal.  HRT use: 0 years. Colonoscopy: no; not examined. Mammogram within the last year: yes. Number of breast biopsies: 1. Up to date with pelvic exams:  yes. Any excessive radiation exposure in the past:  no  Past Medical History  Diagnosis Date  . Diabetes mellitus without complication (HCC)   . Hyperlipidemia   . Anemia   . GERD (gastroesophageal reflux disease)   . Cancer (HCC)   . Hypertension   . Gallstones   . Schizophrenia [facility])     Past Surgical History  Procedure Laterality Date  . Abdominal hysterectomy    . Cholecystectomy N/A 04/16/2014    Procedure: LAPAROSCOPIC CHOLECYSTECTOMY ;  Surgeon: Wilmon Arms. Corliss Skains, MD;  Location: [facility];  Service: General;  Laterality: N/A;    Social History   Social History  . Marital Status: Single    Spouse Name: N/A  . Number of Children: N/A  . Years of Education: N/A   Social History Main Topics  . Smoking status: Current Every Day Smoker -- 0.25 packs/day    Types: Cigarettes  . Smokeless tobacco: None     Comment: 3 cigs per day, but has plans to quit soon (12/24/2015)  . Alcohol Use: No  . Drug Use: No  . Sexual Activity: Not Asked   Other Topics Concern  . None   Social  History Narrative     FAMILY HISTORY:  We obtained a detailed, 4-generation family history.  Significant diagnoses are listed below: Family History  Problem Relation Age of Onset  . Breast cancer [name] 70    s/p partial mastectomy  . Skin cancer [name]     on right knee; unspecified type  . Colon polyps [name]     three total  . Other [name] 54    history of a TAH-BSO for bleeding  . Prostate cancer Maternal Uncle     (x2 maternal uncles)  . Cirrhosis Maternal Grandfather     +EtOH  . Cancer Brother 55    vocal cord ca; not a smoker  . Autism Brother   . Cancer Maternal Uncle     vocal cord ca; +smoker  . Ovarian cancer Maternal Aunt      dx. >50  . Cancer Maternal Aunt     unspecified type  . Diabetes Maternal Aunt   . Colon cancer Cousin     (x2 maternal first cousins--sisters)  . Breast cancer Cousin     dx. 13 or younger  . Cancer Other     NOS cancer; lim info; all three of his daughters had cancer as well  . Breast cancer Other     Alison [name] has four sons and one daughter, all ranging in age from 90-35.  Her oldest son has one daughter of his own.  Alison [name] has four full brothers, all of whom are also in their 6s.  One brother, currently 63, is being treated for vocal cord cancer at [facility]  The other three brothers have not been diagnosed with cancer.  Alison [name], who attended the appointment with her and provided a lot of the family history, is currently 75 and was diagnosed with breast cancer at 65.  She underwent a partial left mastectomy at the time.  She also has a history of a TAH-BSO at 32 and a history of skin cancer on her knee (unspecified type) at the age of 51.  She has a history of 3 colon polyps.  Alison [name] father died after falling down stairs at the age of 70.  He was never diagnosed with cancer.  There is a maternal family history of cancer.  Alison [name] is one of 15 children (all full siblings).  Two maternal uncles have had prostate cancer; neither of these uncles have kids of their own.  Another maternal uncle had vocal cord cancer.  Three maternal uncles died at younger ages--one died as a baby, another was murdered at 6, and a third died in a motor vehicle accident.  One maternal aunt died of ovarian cancer at an age older than 75.  She had 36 children--one daughter died of breast cancer at 27.  Another maternal aunt died of an unknown type of cancer at 42; she had one son who has not had cancer.  Two additional maternal first cousins have been diagnosed with colon cancer.  Alison [name] maternal grandmother died at 45, but never had cancer.  She had several siblings  who died over the age of 47.  The daughter of one brother had a history of what, Alison [name] believes was stomach cancer.  Alison [name] maternal grandfather, a heavy drinker, died of cirrhosis of the liver at 109.  He had one brother who died of an unspecified cancer.  This brother had three daughters who all were diagnosed with unspecified cancers.  The [name]  of this grandfather died at 65, but had a history of breast cancer.  Alison [name] father had one full sister and brother and one maternal half-sister.  None of his siblings have had cancer.  His full sister passed away at 32 and the other two are currently in their late 70s-early 62s.  Alison [name] was unaware of any cancer in any of the paternal first cousins.  Alison [name] paternal grandmother, a heavy drinker, passed away in her 55s-70s.  Her paternal grandfather died at the age of 49.  Alison [name] and her [name] are unaware of any additional cancer diagnoses in the family.    Alison [name] has never had genetic testing and she is unaware of any family members who have had genetic testing.  Patient's maternal and paternal ancestors are of African American descent. There is no reported Ashkenazi Jewish ancestry. There is no known consanguinity.  GENETIC COUNSELING ASSESSMENT: Alison [name] is a 55 y.o. female with a personal and family history of cancer which is somewhat suggestive of hereditary breast/ovarian cancer syndrome and predisposition to cancer. We, therefore, discussed and recommended the following at today's visit.   DISCUSSION: We reviewed the characteristics, features and inheritance patterns of hereditary cancer syndromes, particularly those caused by mutations within the BRCA1/2 genes. We also discussed genetic testing, including the appropriate family members to test, the process of testing, insurance coverage and turn-around-time for results. We discussed the implications of a negative, positive  and/or variant of uncertain significant result. We recommended Ms. Beasley pursue genetic testing for the 20-gene Breast/Ovarian Cancer Panel through [facility].  The Breast/Ovarian Cancer Panel offered by [facility] Hope Pigeon, MD) includes sequencing and deletion/duplication analysis for the following 19 genes:  ATM, BARD1, BRCA1, BRCA2, BRIP1, CDH1, CHEK2, FANCC, MLH1, MSH2, MSH6, NBN, PALB2, PMS2, PTEN, RAD51C, RAD51D, TP53, and XRCC2.  This panel also includes deletion/duplication analysis (without sequencing) for one gene, EPCAM.   Based on Ms. Doberstein's personal and family history of cancer, she meets medical criteria for genetic testing. Despite that she meets criteria, she may still have an out of pocket cost. We discussed that if her out of pocket cost for testing is over $100, the laboratory will call and confirm whether she wants to proceed with testing.  If the out of pocket cost of testing is less than $100 she will be billed by the genetic testing laboratory.   PLAN: After considering the risks, benefits, and limitations, Ms. Heron  provided informed consent to pursue genetic testing and the blood sample was sent to [facility] for analysis of the 20-gene Breast/Ovarian Cancer Panel test. Results are generally available within approximately 2-3 weeks' time, however, since Ms. Esker and her oncology team will use these results to make important surgical/treatment decisions, we will ask that [facility] return these results STAT.  Thus, we should be able to expect these results back closer to the 2-week timeframe, at which point they will be disclosed by telephone to Ms. Quito, as will any additional recommendations warranted by these results. Ms. Weesner will receive a summary of her genetic counseling visit and a copy of her results once available. This information will also be available in Epic. We encouraged Ms. Romm to remain in contact with cancer  genetics annually so that we can continuously update the family history and inform her of any changes in cancer genetics and testing that may be of benefit for her family. Ms. Dacy questions were answered to her satisfaction today. Our  contact information was provided should additional questions or concerns arise.  Thank you for the referral and allowing [place] to share in the care of your patient.   Jeanine Luz, MS Genetic Counselor [email or web address] Phone: [phone]  The patient was seen for a total of 80 minutes in face-to-face genetic counseling.  This patient was discussed with Drs. Magrinat, Lindi Adie and/or Burr Medico who agrees with the above.    _______________________________________________________________________ For Office Staff:  Number of people involved in session: 2 Was an Intern/ student involved with case: no

## 2015-12-25 ENCOUNTER — Other Ambulatory Visit: Payer: Self-pay | Admitting: Surgery

## 2015-12-25 DIAGNOSIS — C50911 Malignant neoplasm of unspecified site of right female breast: Secondary | ICD-10-CM

## 2015-12-30 ENCOUNTER — Encounter: Payer: Medicaid Other | Admitting: Genetic Counselor

## 2016-01-01 NOTE — Progress Notes (Signed)
Location of Breast Cancer: Right Breast  Histology per Pathology Report:  12/04/15 Diagnosis Breast, right, needle core biopsy, upper outer quadrant, 10:00 o'clock - INVASIVE DUCTAL CARCINOMA. - DUCTAL CARCINOMA IN SITU.  Receptor Status: ER(NEG), PR (NEG), Her2-neu (NEG)  Did patient present with symptoms or was this found on screening mammography?: It was found on a screening mammography.  Past/Anticipated interventions by surgeon, if any: She saw Dr. Blackman approximately one week ago, and they were to discuss surgery, but have not talked with him since that time.   Past/Anticipated interventions by medical oncology, if any: She saw Dr. Gudena on 12/24/15 and he recommends: Recommendation based multidisciplinary tumor board: 1. Genetics counseling 2. Breast conserving surgery with sentinel lymph node biopsy 3. Followed by adjuvant chemotherapy (Adriamycin and Cytoxan dose dense 4 followed by Abraxane weekly 12) 4. Followed by radiation  Lymphedema issues, if any:  No  Pain issues, if any:  No SAFETY ISSUES:  Prior radiation? No  Pacemaker/ICD? No  Possible current pregnancy? No  Is the patient on methotrexate? No  Current Complaints / other details:   They have several questions regarding treatment. I have given them the number to Dr. Blackman's office to call to talk about surgery.     ,  L, RN 01/01/2016,9:41 AM   

## 2016-01-05 NOTE — Progress Notes (Signed)
Radiation Oncology         ([phone]) [phone] ________________________________  Initial outpatient Consultation  Name: Alison [name] MRN: [id number]  Date: 01/06/2016  DOB: 10-17-1961  CC:No primary care provider on file.  Alison Keens, MD   REFERRING PHYSICIAN: Coralie Keens, MD  DIAGNOSIS:    ICD-9-CM ICD-10-CM   1. Breast cancer of upper-outer quadrant of right female breast ([place]) 174.4 C50.411 Ambulatory referral to Social Work   Stage I, T1cNxM0 Right Breast UOQ Invasive Ductal Carcinoma with DCIS, ERneg / PRneg / Her2neg, Grade 2  HISTORY OF PRESENT ILLNESS::Alison [name] is a 55 y.o. female who presented with a right breast abnormality on screening mammogram.  US revealed the mass to be 1.4 cm.  Two axillary nodes on the right had borderline diffuse cortical thickening.  Biopsy showed invasive ductal carcinoma with DCIS with characteristics as described above in the diagnosis.  She has a significant family history and is undergoing genetic testing, results pending.  Anticipates breast conservation surgery with Dr. Ninfa [name].  Anticipates chemotherapy thereafter.  She lives in a group home, and is with her mother today.  Her mother is a breast cancer survivor >3 decades.   PREVIOUS RADIATION THERAPY: No  PAST MEDICAL HISTORY:  has a past medical history of Diabetes mellitus without complication ([place]); Hyperlipidemia; Anemia; GERD (gastroesophageal reflux disease); Cancer ([place]); Hypertension; Gallstones; and Schizophrenia ([place]).    PAST SURGICAL HISTORY: Past Surgical History  Procedure Laterality Date  . Abdominal hysterectomy    . Cholecystectomy N/A 04/16/2014    Procedure: LAPAROSCOPIC CHOLECYSTECTOMY ;  Surgeon: [name]. Tsuei, MD;  Location: [facility];  Service: General;  Laterality: N/A;    FAMILY HISTORY: family history includes Autism in her brother; Breast cancer in her cousin and other; Breast cancer (age of onset: 85) in her mother; Cancer in her maternal  aunt, maternal uncle, and other; Cancer (age of onset: 59) in her brother; Cirrhosis in her maternal grandfather; Colon cancer in her cousin; Colon polyps in her mother; Diabetes in her maternal aunt; Other (age of onset: 15) in her mother; Ovarian cancer in her maternal aunt; Prostate cancer in her maternal uncle; Skin cancer in her mother.  SOCIAL HISTORY:  reports that she has been smoking Cigarettes.  She has been smoking about 0.25 packs per day. She does not have any smokeless tobacco history on file. She reports that she does not drink alcohol or use illicit drugs.  ALLERGIES: Review of patient's allergies indicates no known allergies.  MEDICATIONS:  Current Outpatient Prescriptions  Medication Sig Dispense Refill  . acetaminophen (TYLENOL) 325 MG tablet Take 650 mg by mouth every 4 (four) hours as needed for mild pain.     . benztropine (COGENTIN) 0.5 MG tablet Take 0.5 mg by mouth 2 (two) times daily.    . divalproex (DEPAKOTE ER) 500 MG 24 hr tablet Take 1,000 mg by mouth at bedtime.    . furosemide (LASIX) 40 MG tablet Take 40 mg by mouth every morning.    Marland Kitchen lisinopril (PRINIVIL,ZESTRIL) 10 MG tablet Take 10 mg by mouth every morning.     . metFORMIN (GLUCOPHAGE) 500 MG tablet Take by mouth 2 (two) times daily with a meal.    . OLANZapine (ZYPREXA) 10 MG tablet Take 10 mg by mouth at bedtime.    Marland Kitchen omeprazole (PRILOSEC) 20 MG capsule Take 40 mg by mouth every morning.    Marland Kitchen oxyCODONE-acetaminophen (PERCOCET/ROXICET) 5-325 MG per tablet Take 1 tablet by mouth every 4 (four) hours  as needed for severe pain. 40 tablet 0  . Polyethyl Glycol-Propyl Glycol (SYSTANE) 0.4-0.3 % SOLN Apply 1 drop to eye 4 (four) times daily.    . simvastatin (ZOCOR) 10 MG tablet Take 10 mg by mouth at bedtime.     No current facility-administered medications for this encounter.    REVIEW OF SYSTEMS:  Notable for that above.   PHYSICAL EXAM:  height is 5' 0.5" (1.537 m) and weight is 181 lb 12.8 oz (82.464  kg). Her temperature is 97.8 F (36.6 C). Her blood pressure is 112/72 and her pulse is 86.   General: Alert and oriented, in no acute distress HEENT: Head is normocephalic. Extraocular movements are intact. Oropharynx is clear. Neck: Neck is supple, no palpable cervical or supraclavicular lymphadenopathy. Heart: Regular in rate and rhythm with no murmurs, rubs, or gallops. Chest: Clear to auscultation bilaterally, with no rhonchi, wheezes, or rales. Abdomen: Soft, nontender, nondistended, with no rigidity or guarding. Extremities: No cyanosis or edema. Lymphatics: see Neck Exam Skin: No concerning lesions. Musculoskeletal: symmetric strength and muscle tone throughout. Neurologic: Cranial nerves II through XII are grossly intact. No obvious focalities. Speech is fluent. Coordination is intact. Psychiatric: Judgment and insight are intact. Affect is appropriate. Breasts: UOQ of right breast notable for a 2cm region of mass-like thickening . No other palpable masses appreciated in the breasts or axillae bilaterally .   ECOG = 1  0 - Asymptomatic (Fully active, able to carry on all predisease activities without restriction)  1 - Symptomatic but completely ambulatory (Restricted in physically strenuous activity but ambulatory and able to carry out work of a light or sedentary nature. For example, light housework, office work)  2 - Symptomatic, <50% in bed during the day (Ambulatory and capable of all self care but unable to carry out any work activities. Up and about more than 50% of waking hours)  3 - Symptomatic, >50% in bed, but not bedbound (Capable of only limited self-care, confined to bed or chair 50% or more of waking hours)  4 - Bedbound (Completely disabled. Cannot carry on any self-care. Totally confined to bed or chair)  5 - Death   Eustace Pen MM, Creech RH, Tormey DC, et al. [phone]). "Toxicity and response criteria of the [facility]". [place] Oncol. 5  (6): 649-55   LABORATORY DATA:  Lab Results  Component Value Date   WBC 7.7 04/17/2014   HGB 11.3* 04/17/2014   HCT 33.5* 04/17/2014   MCV 84.8 04/17/2014   PLT 234 04/17/2014   CMP     Component Value Date/Time   NA 141 04/17/2014 0345   K 4.4 04/17/2014 0345   CL 100 04/17/2014 0345   CO2 25 04/17/2014 0345   GLUCOSE 248* 04/17/2014 0345   BUN 9 04/17/2014 0345   CREATININE 0.62 04/17/2014 0345   CALCIUM 8.8 04/17/2014 0345   PROT 6.8 04/17/2014 0345   ALBUMIN 3.3* 04/17/2014 0345   AST 59* 04/17/2014 0345   ALT 66* 04/17/2014 0345   ALKPHOS 76 04/17/2014 0345   BILITOT <0.2* 04/17/2014 0345   GFRNONAA >90 04/17/2014 0345   GFRAA >90 04/17/2014 0345         RADIOGRAPHY: as above    IMPRESSION/PLAN: Right breast cancer.  The consensus is that she would be a good candidate for breast conservation. I talked to her about the option of a mastectomy and informed her that her expected overall survival would be equivalent between mastectomy and breast conservation,  based upon randomized controlled data. She is enthusiastic about breast conservation.  It was a pleasure meeting the patient today. We discussed the risks, benefits, and side effects of radiotherapy. I recommend radiotherapy to the right breast and possibly the regional nodes to reduce her risk of locoregional recurrence by 2/3.  We discussed that radiation would take approximately 6-7 weeks to complete and that I would give the patient a few weeks after chemotherapy to recover, before starting treatment. Since chemotherapy will be given, this would precede radiotherapy. We spoke about acute effects including skin irritation and fatigue as well as much less common late effects including internal organ injury or irritation. We spoke about the latest technology that is used to minimize the risk of late effects for patients undergoing radiotherapy to the breast or chest wall. No guarantees of treatment were given. The  patient is enthusiastic about proceeding with treatment. I look forward to participating in the patient's care.   __________________________________________   Eppie Gibson, MD

## 2016-01-06 ENCOUNTER — Encounter: Payer: Self-pay | Admitting: Radiation Oncology

## 2016-01-06 ENCOUNTER — Ambulatory Visit
Admission: RE | Admit: 2016-01-06 | Discharge: 2016-01-06 | Disposition: A | Discharge status: Home / Self Care | Payer: Medicaid Other | Source: Ambulatory Visit | Attending: Radiation Oncology | Admitting: Radiation Oncology

## 2016-01-06 VITALS — BP 112/72 | HR 86 | Temp 97.8°F | Ht 60.5 in | Wt 181.8 lb

## 2016-01-06 DIAGNOSIS — I10 Essential (primary) hypertension: Secondary | ICD-10-CM | POA: Insufficient documentation

## 2016-01-06 DIAGNOSIS — K8080 Other cholelithiasis without obstruction: Secondary | ICD-10-CM | POA: Insufficient documentation

## 2016-01-06 DIAGNOSIS — K219 Gastro-esophageal reflux disease without esophagitis: Secondary | ICD-10-CM | POA: Diagnosis not present

## 2016-01-06 DIAGNOSIS — C50411 Malignant neoplasm of upper-outer quadrant of right female breast: Secondary | ICD-10-CM

## 2016-01-06 DIAGNOSIS — D649 Anemia, unspecified: Secondary | ICD-10-CM | POA: Diagnosis not present

## 2016-01-06 DIAGNOSIS — E785 Hyperlipidemia, unspecified: Secondary | ICD-10-CM | POA: Diagnosis not present

## 2016-01-06 DIAGNOSIS — Z9049 Acquired absence of other specified parts of digestive tract: Secondary | ICD-10-CM | POA: Insufficient documentation

## 2016-01-06 DIAGNOSIS — Z809 Family history of malignant neoplasm, unspecified: Secondary | ICD-10-CM | POA: Diagnosis not present

## 2016-01-06 DIAGNOSIS — F209 Schizophrenia, unspecified: Secondary | ICD-10-CM | POA: Insufficient documentation

## 2016-01-06 DIAGNOSIS — E119 Type 2 diabetes mellitus without complications: Secondary | ICD-10-CM | POA: Diagnosis not present

## 2016-01-06 DIAGNOSIS — Z803 Family history of malignant neoplasm of breast: Secondary | ICD-10-CM | POA: Diagnosis not present

## 2016-01-06 DIAGNOSIS — Z90710 Acquired absence of both cervix and uterus: Secondary | ICD-10-CM | POA: Insufficient documentation

## 2016-01-06 DIAGNOSIS — F17210 Nicotine dependence, cigarettes, uncomplicated: Secondary | ICD-10-CM | POA: Insufficient documentation

## 2016-01-11 ENCOUNTER — Encounter: Payer: Self-pay | Admitting: *Deleted

## 2016-01-11 ENCOUNTER — Other Ambulatory Visit: Payer: Self-pay | Admitting: Surgery

## 2016-01-11 DIAGNOSIS — C50911 Malignant neoplasm of unspecified site of right female breast: Secondary | ICD-10-CM

## 2016-01-12 ENCOUNTER — Encounter: Payer: Self-pay | Admitting: *Deleted

## 2016-01-12 NOTE — Progress Notes (Signed)
[facility] Psychosocial Distress Screening Clinical Social Work  Clinical Social Work was referred by distress screening protocol.  The patient scored a 6 on the Psychosocial Distress Thermometer which indicates moderate distress. Clinical Social Worker attempeted to contact patient at home to assess for distress and other psychosocial needs.  CSW was unable to reach patient and was unable to leave a message due to no voicemail option.  CSW will attempt to contact at a later date.       ONCBCN DISTRESS SCREENING 01/06/2016  Screening Type Initial Screening  Distress experienced in past week (1-10) 6  Practical problem type Insurance;Transportation  Physical Problem type Pain;Sleep/insomnia  Physician notified of physical symptoms Yes   Johnnye Lana, MSW, LCSW, OSW-C Clinical Social Worker [place] [phone]

## 2016-01-13 ENCOUNTER — Telehealth: Payer: Self-pay | Admitting: Hematology and Oncology

## 2016-01-13 ENCOUNTER — Telehealth: Payer: Self-pay | Admitting: Genetic Counselor

## 2016-01-13 ENCOUNTER — Encounter (HOSPITAL_BASED_OUTPATIENT_CLINIC_OR_DEPARTMENT_OTHER): Payer: Self-pay | Admitting: *Deleted

## 2016-01-13 NOTE — Telephone Encounter (Signed)
Spoke with patients mother as well as main number which the group home where she lives as well

## 2016-01-14 ENCOUNTER — Other Ambulatory Visit: Payer: Self-pay

## 2016-01-14 ENCOUNTER — Encounter (HOSPITAL_BASED_OUTPATIENT_CLINIC_OR_DEPARTMENT_OTHER)
Admission: RE | Admit: 2016-01-14 | Discharge: 2016-01-14 | Disposition: A | Discharge status: Home / Self Care | Payer: Medicaid Other | Source: Ambulatory Visit | Attending: Surgery | Admitting: Surgery

## 2016-01-14 DIAGNOSIS — Z01812 Encounter for preprocedural laboratory examination: Secondary | ICD-10-CM | POA: Diagnosis present

## 2016-01-14 DIAGNOSIS — I10 Essential (primary) hypertension: Secondary | ICD-10-CM | POA: Insufficient documentation

## 2016-01-14 DIAGNOSIS — Z01810 Encounter for preprocedural cardiovascular examination: Secondary | ICD-10-CM | POA: Insufficient documentation

## 2016-01-14 DIAGNOSIS — Z79899 Other long term (current) drug therapy: Secondary | ICD-10-CM | POA: Diagnosis not present

## 2016-01-14 DIAGNOSIS — F209 Schizophrenia, unspecified: Secondary | ICD-10-CM | POA: Diagnosis not present

## 2016-01-14 DIAGNOSIS — F17200 Nicotine dependence, unspecified, uncomplicated: Secondary | ICD-10-CM | POA: Diagnosis not present

## 2016-01-14 DIAGNOSIS — K219 Gastro-esophageal reflux disease without esophagitis: Secondary | ICD-10-CM | POA: Diagnosis not present

## 2016-01-14 DIAGNOSIS — F78 Other intellectual disabilities: Secondary | ICD-10-CM | POA: Diagnosis not present

## 2016-01-14 DIAGNOSIS — C50911 Malignant neoplasm of unspecified site of right female breast: Secondary | ICD-10-CM | POA: Diagnosis present

## 2016-01-14 DIAGNOSIS — Z7984 Long term (current) use of oral hypoglycemic drugs: Secondary | ICD-10-CM | POA: Diagnosis not present

## 2016-01-14 DIAGNOSIS — Z803 Family history of malignant neoplasm of breast: Secondary | ICD-10-CM | POA: Diagnosis not present

## 2016-01-14 DIAGNOSIS — E119 Type 2 diabetes mellitus without complications: Secondary | ICD-10-CM | POA: Diagnosis not present

## 2016-01-14 DIAGNOSIS — Z171 Estrogen receptor negative status [ER-]: Secondary | ICD-10-CM | POA: Diagnosis not present

## 2016-01-14 DIAGNOSIS — C50411 Malignant neoplasm of upper-outer quadrant of right female breast: Secondary | ICD-10-CM | POA: Diagnosis not present

## 2016-01-14 LAB — BASIC METABOLIC PANEL
Anion gap: 15 (ref 5–15)
BUN: 7 mg/dL (ref 6–20)
CO2: 28 mmol/L (ref 22–32)
Calcium: 9.2 mg/dL (ref 8.9–10.3)
Chloride: 102 mmol/L (ref 101–111)
Creatinine, Ser: 0.77 mg/dL (ref 0.44–1.00)
GFR calc Af Amer: >60 mL/min (ref >60)
GFR calc non Af Amer: >60 mL/min (ref >60)
Glucose, Bld: 150 mg/dL — ABNORMAL HIGH (ref 65–99)
Potassium: 4.3 mmol/L (ref 3.5–5.1)
Sodium: 145 mmol/L (ref 135–145)

## 2016-01-18 ENCOUNTER — Ambulatory Visit
Admission: RE | Admit: 2016-01-18 | Discharge: 2016-01-18 | Disposition: A | Discharge status: Home / Self Care | Payer: Medicaid Other | Source: Ambulatory Visit | Attending: Surgery | Admitting: Surgery

## 2016-01-18 ENCOUNTER — Telehealth: Payer: Self-pay | Admitting: Genetic Counselor

## 2016-01-18 DIAGNOSIS — C50911 Malignant neoplasm of unspecified site of right female breast: Secondary | ICD-10-CM

## 2016-01-18 NOTE — Telephone Encounter (Signed)
LM on VM for Leiana's mother, Genoveva Ill that results were back.  Provided call back instructions for either today (my phone number) or tomorrow ([facility] phone number).

## 2016-01-19 ENCOUNTER — Other Ambulatory Visit: Payer: Self-pay | Admitting: Surgery

## 2016-01-19 NOTE — H&P (Signed)
Alison [name]  Location: [facility] Patient #: [id number] DOB: [birth year] Single / Language: Cleophus Molt / Race: Black or African American Female   History of Present Illness (Youcef Klas A. Ninfa Linden MD;  Patient words: New-Breast Cancer.  The patient is a 55 year old female who presents with breast cancer. This patient is referred by Dr. Iona Beard after the recent diagnosis of a right breast cancer. She is currently in a group home with a history of schizophrenia and slight mental retardation. She is accompanied by her mother. Her mother reports that she herself had breast cancer at age 26 which required chemotherapy and radiation after surgery. The patient reports he has a daughter who is an adult who they do not know how to contact. The patient has no previous problems regarding her breasts. She has no nipple discharge. She is otherwise without complaints. Her most recent mammogram and ultrasound demonstrated a 1.4 cm mass in the upper outer quadrant of the breast with several enlarged lymph nodes. Stereotactic biopsy revealed both invasive and in situ ductal carcinoma. It was ER, PR, and HER-2/neu negative.   Other Problems Alison [name], CMA; Back Pain Breast Cancer Chest pain Diabetes Mellitus High blood pressure Lump In Breast Pancreatitis  Past Surgical History (Chemira Jones, CM No pertinent past surgical history  Diagnostic Studies History Alison [name], CMA;  Mammogram 1-3 years ago Pap Smear 1-5 years ago  Allergies Alison [name], CMA; No Known Drug Allergies01/02/2016  Family History Alison [name], CMA;  Arthritis Mother. Breast Cancer Mother. Colon Polyps Mother. Diabetes Mellitus Mother. Hypertension Father, Mother. Migraine Headache Mother.  Pregnancy / Birth History  Age at menarche 29 years. Gravida 5 Irregular periods Maternal age 36-20 Para 5    Review of Systems Alison [name] CMA;  General Present-  Chills and Weight Loss. Not Present- Appetite Loss, Fatigue, Fever, Night Sweats and Weight Gain. Skin Present- Non-Healing Wounds. Not Present- Change in Wart/Mole, Dryness, Hives, Jaundice, New Lesions, Rash and Ulcer. Respiratory Present- Snoring. Not Present- Bloody sputum, Chronic Cough, Difficulty Breathing and Wheezing. Cardiovascular Present- Chest Pain. Not Present- Difficulty Breathing Lying Down, Leg Cramps, Palpitations, Rapid Heart Rate, Shortness of Breath and Swelling of Extremities. Female Genitourinary Present- Nocturia. Not Present- Frequency, Painful Urination, Pelvic Pain and Urgency. Musculoskeletal Present- Joint Stiffness. Not Present- Back Pain, Joint Pain, Muscle Pain, Muscle Weakness and Swelling of Extremities. Neurological Present- Decreased Memory. Not Present- Fainting, Headaches, Numbness, Seizures, Tingling, Tremor, Trouble walking and Weakness. Psychiatric Present- Change in Sleep Pattern, Depression, Fearful and Frequent crying. Not Present- Anxiety and Bipolar. Endocrine Present- Hair Changes and Hot flashes. Not Present- Cold Intolerance, Excessive Hunger, Heat Intolerance and New Diabetes. Hematology Present- Easy Bruising. Not Present- Excessive bleeding, Gland problems, HIV and Persistent Infections.  Vitals   Weight: 183.6 lb Height: 61.5in Body Surface Area: 1.83 m Body Mass Index: 34.13 kg/m  Temp.: 98.46F(Oral)  Pulse: 68 (Regular)  BP: 120/76 (Sitting, Left Arm, Standard)    Physical Ex General Mental Status-Alert. General Appearance-Consistent with stated age. Hydration-Well hydrated. Voice-Normal.  Head and Neck Head-normocephalic, atraumatic with no lesions or palpable masses. Trachea-midline. Thyroid Gland Characteristics - normal size and consistency.  Eye Eyeball - Bilateral-Extraocular movements intact. Sclera/Conjunctiva - Bilateral-No scleral icterus. Chest and Lung Exam Chest and lung exam  reveals -quiet, even and easy respiratory effort with no use of accessory muscles and on auscultation, normal breath sounds, no adventitious sounds and normal vocal resonance. Inspection Chest Wall - Normal. Back - normal.  Breast Breast - Left-Symmetric, Non  Tender, No Biopsy scars, no Dimpling, No Inflammation, No Lumpectomy scars, No Mastectomy scars, No Peau d' Orange. Breast - Right-Symmetric, Non Tender, No Biopsy scars, no Dimpling, No Inflammation, No Lumpectomy scars, No Mastectomy scars, No Peau d' Orange. Breast Lump-No Palpable Breast Mass.  Cardiovascular Cardiovascular examination reveals -normal heart sounds, regular rate and rhythm with no murmurs and normal pedal pulses bilaterally.  Abdomen Inspection Inspection of the abdomen reveals - No Hernias. Skin - Scar - no surgical scars. Palpation/Percussion Palpation and Percussion of the abdomen reveal - Soft, Non Tender, No Rebound tenderness, No Rigidity (guarding) and No hepatosplenomegaly. Auscultation Auscultation of the abdomen reveals - Bowel sounds normal.  Neurologic Neurologic evaluation reveals -alert and oriented x 3 with no impairment of recent or remote memory. Mental Status-Normal.  Musculoskeletal Normal Exam - Left-Upper Extremity Strength Normal and Lower Extremity Strength Normal. Normal Exam - Right-Upper Extremity Strength Normal and Lower Extremity Strength Normal.  Lymphatic Head & Neck  General Head & Neck Lymphatics: Bilateral - Description - Normal. Axillary  General Axillary Region: Bilateral - Description -  Note: She has several enlarged lymph nodes in the right axilla which are mobile. The largest is approximately 1-1/2 cm in size. Tenderness - Non Tender. Femoral & Inguinal  Generalized Femoral & Inguinal Lymphatics: Bilateral - Description - Normal. Tenderness - Non Tender.    Assessment & Plan (Kileen Lange A. Ninfa Linden MD; 12/15/2015 10:37 AM)  BREAST CANCER, STAGE 2,  RIGHT (C50.911)  Impression: The patient and her mother seemed to understand the diagnosis given the mother's history of breast cancer. She is being presented tomorrow in our multidisciplinary cancer conference. Currently, I would recommend radioactive seed localized lumpectomy with sentinel lymph node biopsy and Port-A-Cath insertion. She'll need referral to both medical and radiation oncology as well as genetics given her family history. Our plans may change given her comorbidities. I will call the patient's mother and start making plans for eventual surgery after she is discussed in conference.  Addendum:  All agreed with the above plan.  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  I discussed this with the patient's mother.  She does not think that her daughter can reasonable understand this given her mental state and wants me to proceed with the above mentioned surgery for now and will consider bilateral mastectomy at a later date.  Will proceed with the right breast lumpectomy and sentinel node biopsy as well as port a cath insertion.

## 2016-01-19 NOTE — Telephone Encounter (Signed)
Discussed with Alison [name], Alison [name], that Alison [name]'s genetic test results were positive for a pathogenic BRCA2 mutation that explains her breast cancer, causes her to be at a high risk for another breast cancer in the future, and causes her to be at a high risk for ovarian cancer.  Discussed that we would like to bring her and Alison [name] back in for a follow-up appt--we scheduled this for Friday, February 17th at 9 AM, prior to her appt with Dr. Gudena at 10:15 AM.  Discussed that Alison [name]'s children are at a 50% chance for having inherited this same change, so they can have testing as well.  Also, Alison [name]'s brothers can have testing.  Discussed having Alison [name] tested for this same mutation, since she had breast cancer at 45, it is most likely this was inherited from her.  Alison [name] is scheduled for an appt the same day and time at [facility] and will have a blood draw after, if she is interested.  Reviewed that [name] has has a hysterectomy, but, she believes, has not had her ovaries/tubes removed, so we discussed that this may be something Dr. Gudena will talk with them about.  We will review more about cancer risks when I see them on 2/17.  She has my number to call me with any questions before then.  I will make her doctors aware of this result.   

## 2016-01-20 ENCOUNTER — Ambulatory Visit (HOSPITAL_COMMUNITY)
Admission: RE | Admit: 2016-01-20 | Discharge: 2016-01-20 | Disposition: A | Discharge status: Home / Self Care | Payer: Medicaid Other | Source: Ambulatory Visit | Attending: Surgery | Admitting: Surgery

## 2016-01-20 ENCOUNTER — Ambulatory Visit (HOSPITAL_BASED_OUTPATIENT_CLINIC_OR_DEPARTMENT_OTHER)
Admission: RE | Admit: 2016-01-20 | Discharge: 2016-01-21 | Disposition: A | Discharge status: Home / Self Care | Payer: Medicaid Other | Source: Ambulatory Visit | Attending: Surgery | Admitting: Surgery

## 2016-01-20 ENCOUNTER — Encounter (HOSPITAL_BASED_OUTPATIENT_CLINIC_OR_DEPARTMENT_OTHER): Payer: Self-pay | Admitting: Certified Registered"

## 2016-01-20 ENCOUNTER — Ambulatory Visit
Admission: RE | Admit: 2016-01-20 | Discharge: 2016-01-20 | Disposition: A | Discharge status: Home / Self Care | Payer: Medicaid Other | Source: Ambulatory Visit | Attending: Surgery | Admitting: Surgery

## 2016-01-20 ENCOUNTER — Ambulatory Visit (HOSPITAL_BASED_OUTPATIENT_CLINIC_OR_DEPARTMENT_OTHER): Payer: Medicaid Other | Admitting: Certified Registered"

## 2016-01-20 ENCOUNTER — Ambulatory Visit (HOSPITAL_COMMUNITY): Payer: Medicaid Other

## 2016-01-20 ENCOUNTER — Encounter (HOSPITAL_BASED_OUTPATIENT_CLINIC_OR_DEPARTMENT_OTHER)
Admission: RE | Disposition: A | Discharge status: Home / Self Care | Payer: Self-pay | Source: Ambulatory Visit | Attending: Surgery

## 2016-01-20 DIAGNOSIS — C50411 Malignant neoplasm of upper-outer quadrant of right female breast: Secondary | ICD-10-CM | POA: Diagnosis not present

## 2016-01-20 DIAGNOSIS — Z79899 Other long term (current) drug therapy: Secondary | ICD-10-CM | POA: Insufficient documentation

## 2016-01-20 DIAGNOSIS — Z95828 Presence of other vascular implants and grafts: Secondary | ICD-10-CM

## 2016-01-20 DIAGNOSIS — I10 Essential (primary) hypertension: Secondary | ICD-10-CM | POA: Insufficient documentation

## 2016-01-20 DIAGNOSIS — F78 Other intellectual disabilities: Secondary | ICD-10-CM | POA: Insufficient documentation

## 2016-01-20 DIAGNOSIS — C50911 Malignant neoplasm of unspecified site of right female breast: Secondary | ICD-10-CM

## 2016-01-20 DIAGNOSIS — Z171 Estrogen receptor negative status [ER-]: Secondary | ICD-10-CM | POA: Insufficient documentation

## 2016-01-20 DIAGNOSIS — Z7984 Long term (current) use of oral hypoglycemic drugs: Secondary | ICD-10-CM | POA: Insufficient documentation

## 2016-01-20 DIAGNOSIS — Z419 Encounter for procedure for purposes other than remedying health state, unspecified: Secondary | ICD-10-CM

## 2016-01-20 DIAGNOSIS — F17200 Nicotine dependence, unspecified, uncomplicated: Secondary | ICD-10-CM | POA: Insufficient documentation

## 2016-01-20 DIAGNOSIS — F209 Schizophrenia, unspecified: Secondary | ICD-10-CM | POA: Diagnosis not present

## 2016-01-20 DIAGNOSIS — C50919 Malignant neoplasm of unspecified site of unspecified female breast: Secondary | ICD-10-CM | POA: Diagnosis present

## 2016-01-20 DIAGNOSIS — K219 Gastro-esophageal reflux disease without esophagitis: Secondary | ICD-10-CM | POA: Insufficient documentation

## 2016-01-20 DIAGNOSIS — E119 Type 2 diabetes mellitus without complications: Secondary | ICD-10-CM | POA: Insufficient documentation

## 2016-01-20 DIAGNOSIS — Z803 Family history of malignant neoplasm of breast: Secondary | ICD-10-CM | POA: Insufficient documentation

## 2016-01-20 HISTORY — PX: PORTACATH PLACEMENT: SHX2246

## 2016-01-20 HISTORY — PX: BREAST LUMPECTOMY WITH RADIOACTIVE SEED AND SENTINEL LYMPH NODE BIOPSY: SHX6550

## 2016-01-20 LAB — GLUCOSE, CAPILLARY
Glucose-Capillary: 105 mg/dL — ABNORMAL HIGH (ref 65–99)
Glucose-Capillary: 168 mg/dL — ABNORMAL HIGH (ref 65–99)

## 2016-01-20 SURGERY — BREAST LUMPECTOMY WITH RADIOACTIVE SEED AND SENTINEL LYMPH NODE BIOPSY
Anesthesia: Regional | Site: Chest | Laterality: Right

## 2016-01-20 MED ORDER — ONDANSETRON HCL 4 MG/2ML IJ SOLN
INTRAMUSCULAR | Status: DC | PRN
  Administered 2016-01-20: 4 mg via INTRAVENOUS

## 2016-01-20 MED ORDER — PANTOPRAZOLE SODIUM 40 MG PO TBEC: 40.0000 mg | DELAYED_RELEASE_TABLET | Freq: Every day | ORAL | Status: DC

## 2016-01-20 MED ORDER — PROPOFOL 10 MG/ML IV BOLUS
INTRAVENOUS | Status: DC | PRN
  Administered 2016-01-20: 160 mg via INTRAVENOUS

## 2016-01-20 MED ORDER — TECHNETIUM TC 99M SULFUR COLLOID FILTERED
1.0000 | Freq: Once | INTRAVENOUS | Status: AC | PRN
  Administered 2016-01-20: 1 via INTRADERMAL

## 2016-01-20 MED ORDER — ONDANSETRON HCL 4 MG/2ML IJ SOLN
INTRAMUSCULAR | Status: AC
  Filled 2016-01-20: qty 2

## 2016-01-20 MED ORDER — DEXAMETHASONE SODIUM PHOSPHATE 10 MG/ML IJ SOLN
INTRAMUSCULAR | Status: AC
  Filled 2016-01-20: qty 1

## 2016-01-20 MED ORDER — OXYCODONE HCL 5 MG/5ML PO SOLN: 5.0000 mg | Freq: Once | ORAL | Status: AC | PRN

## 2016-01-20 MED ORDER — ACETAMINOPHEN 160 MG/5ML PO SOLN: 325.0000 mg | ORAL | Status: DC | PRN

## 2016-01-20 MED ORDER — LIDOCAINE HCL (CARDIAC) 20 MG/ML IV SOLN
INTRAVENOUS | Status: AC
  Filled 2016-01-20: qty 5

## 2016-01-20 MED ORDER — MIDAZOLAM HCL 2 MG/2ML IJ SOLN
1.0000 mg | INTRAMUSCULAR | Status: DC | PRN
  Administered 2016-01-20: 1 mg via INTRAVENOUS

## 2016-01-20 MED ORDER — CEFAZOLIN SODIUM-DEXTROSE 2-3 GM-% IV SOLR
2.0000 g | INTRAVENOUS | Status: AC
  Administered 2016-01-20: 2 g via INTRAVENOUS

## 2016-01-20 MED ORDER — LIDOCAINE HCL (PF) 1 % IJ SOLN
INTRAMUSCULAR | Status: AC
  Filled 2016-01-20: qty 30

## 2016-01-20 MED ORDER — FENTANYL CITRATE (PF) 100 MCG/2ML IJ SOLN
50.0000 ug | INTRAMUSCULAR | Status: AC | PRN
  Administered 2016-01-20 (×3): 50 ug via INTRAVENOUS

## 2016-01-20 MED ORDER — ACETAMINOPHEN 325 MG PO TABS: 325.0000 mg | ORAL_TABLET | ORAL | Status: DC | PRN

## 2016-01-20 MED ORDER — LISINOPRIL 10 MG PO TABS: 10.0000 mg | ORAL_TABLET | Freq: Every morning | ORAL | Status: DC

## 2016-01-20 MED ORDER — FENTANYL CITRATE (PF) 100 MCG/2ML IJ SOLN
INTRAMUSCULAR | Status: AC
  Filled 2016-01-20: qty 2

## 2016-01-20 MED ORDER — OXYCODONE-ACETAMINOPHEN 5-325 MG PO TABS: 1.0000 | ORAL_TABLET | ORAL | Status: DC | PRN

## 2016-01-20 MED ORDER — OXYCODONE HCL 5 MG PO TABS: 5.0000 mg | ORAL_TABLET | Freq: Once | ORAL | Status: DC | PRN

## 2016-01-20 MED ORDER — HEPARIN (PORCINE) IN NACL 2-0.9 UNIT/ML-% IJ SOLN
INTRAMUSCULAR | Status: AC
  Filled 2016-01-20: qty 500

## 2016-01-20 MED ORDER — FENTANYL CITRATE (PF) 100 MCG/2ML IJ SOLN: 25.0000 ug | INTRAMUSCULAR | Status: DC | PRN

## 2016-01-20 MED ORDER — HEPARIN SOD (PORK) LOCK FLUSH 100 UNIT/ML IV SOLN
INTRAVENOUS | Status: AC
  Filled 2016-01-20: qty 5

## 2016-01-20 MED ORDER — SODIUM BICARBONATE 4 % IV SOLN
INTRAVENOUS | Status: AC
  Filled 2016-01-20: qty 5

## 2016-01-20 MED ORDER — BENZTROPINE MESYLATE 0.5 MG PO TABS
0.5000 mg | ORAL_TABLET | Freq: Two times a day (BID) | ORAL | Status: DC
  Administered 2016-01-20 (×2): 0.5 mg via ORAL

## 2016-01-20 MED ORDER — FUROSEMIDE 40 MG PO TABS: 40.0000 mg | ORAL_TABLET | Freq: Every morning | ORAL | Status: DC

## 2016-01-20 MED ORDER — OXYCODONE HCL 5 MG/5ML PO SOLN: 5.0000 mg | Freq: Once | ORAL | Status: DC | PRN

## 2016-01-20 MED ORDER — ONDANSETRON HCL 4 MG/2ML IJ SOLN: 4.0000 mg | Freq: Four times a day (QID) | INTRAMUSCULAR | Status: DC | PRN

## 2016-01-20 MED ORDER — ONDANSETRON 4 MG PO TBDP: 4.0000 mg | ORAL_TABLET | Freq: Four times a day (QID) | ORAL | Status: DC | PRN

## 2016-01-20 MED ORDER — BUPIVACAINE-EPINEPHRINE 0.5% -1:200000 IJ SOLN
INTRAMUSCULAR | Status: DC | PRN
  Administered 2016-01-20: 30 mL

## 2016-01-20 MED ORDER — SCOPOLAMINE 1 MG/3DAYS TD PT72: 1.0000 | MEDICATED_PATCH | Freq: Once | TRANSDERMAL | Status: DC | PRN

## 2016-01-20 MED ORDER — OLANZAPINE 10 MG PO TABS
10.0000 mg | ORAL_TABLET | Freq: Every day | ORAL | Status: DC
  Administered 2016-01-20: 10 mg via ORAL

## 2016-01-20 MED ORDER — HEPARIN SOD (PORK) LOCK FLUSH 100 UNIT/ML IV SOLN
INTRAVENOUS | Status: DC | PRN
  Administered 2016-01-20: 500 [IU] via INTRAVENOUS

## 2016-01-20 MED ORDER — CEFAZOLIN SODIUM-DEXTROSE 2-3 GM-% IV SOLR
INTRAVENOUS | Status: AC
  Filled 2016-01-20: qty 50

## 2016-01-20 MED ORDER — DEXAMETHASONE SODIUM PHOSPHATE 4 MG/ML IJ SOLN
INTRAMUSCULAR | Status: DC | PRN
  Administered 2016-01-20: 4 mg via INTRAVENOUS

## 2016-01-20 MED ORDER — OXYCODONE HCL 5 MG PO TABS
5.0000 mg | ORAL_TABLET | Freq: Once | ORAL | Status: AC | PRN
  Administered 2016-01-20: 5 mg via ORAL
  Filled 2016-01-20: qty 1

## 2016-01-20 MED ORDER — HEPARIN (PORCINE) IN NACL 2-0.9 UNIT/ML-% IJ SOLN
INTRAMUSCULAR | Status: DC | PRN
  Administered 2016-01-20: 20 mL via INTRAVENOUS

## 2016-01-20 MED ORDER — POLYETHYL GLYCOL-PROPYL GLYCOL 0.4-0.3 % OP SOLN
1.0000 [drp] | Freq: Four times a day (QID) | OPHTHALMIC | Status: DC
  Administered 2016-01-20 – 2016-01-21 (×3): 1 [drp] via OPHTHALMIC

## 2016-01-20 MED ORDER — LACTATED RINGERS IV SOLN
INTRAVENOUS | Status: DC
  Administered 2016-01-20: 10 mL/h via INTRAVENOUS
  Administered 2016-01-20: 11:00:00 via INTRAVENOUS

## 2016-01-20 MED ORDER — MORPHINE SULFATE (PF) 2 MG/ML IV SOLN: 1.0000 mg | INTRAVENOUS | Status: DC | PRN

## 2016-01-20 MED ORDER — SODIUM CHLORIDE 0.9 % IV SOLN
INTRAVENOUS | Status: DC
  Administered 2016-01-20 (×2): via INTRAVENOUS

## 2016-01-20 MED ORDER — SIMVASTATIN 10 MG PO TABS
10.0000 mg | ORAL_TABLET | Freq: Every day | ORAL | Status: DC
  Administered 2016-01-20: 10 mg via ORAL

## 2016-01-20 MED ORDER — ENOXAPARIN SODIUM 40 MG/0.4ML ~~LOC~~ SOLN: 40.0000 mg | SUBCUTANEOUS | Status: DC

## 2016-01-20 MED ORDER — BUPIVACAINE-EPINEPHRINE (PF) 0.5% -1:200000 IJ SOLN
INTRAMUSCULAR | Status: AC
  Filled 2016-01-20: qty 30

## 2016-01-20 MED ORDER — LIDOCAINE HCL (CARDIAC) 20 MG/ML IV SOLN
INTRAVENOUS | Status: DC | PRN
  Administered 2016-01-20: 60 mg via INTRAVENOUS

## 2016-01-20 MED ORDER — DIVALPROEX SODIUM ER 500 MG PO TB24
1000.0000 mg | ORAL_TABLET | Freq: Every day | ORAL | Status: DC
  Administered 2016-01-20: 1000 mg via ORAL

## 2016-01-20 MED ORDER — DIPHENHYDRAMINE HCL 25 MG PO CAPS: 25.0000 mg | ORAL_CAPSULE | Freq: Four times a day (QID) | ORAL | Status: DC | PRN

## 2016-01-20 MED ORDER — MIDAZOLAM HCL 2 MG/2ML IJ SOLN
INTRAMUSCULAR | Status: AC
  Filled 2016-01-20: qty 2

## 2016-01-20 MED ORDER — GLYCOPYRROLATE 0.2 MG/ML IJ SOLN: 0.2000 mg | Freq: Once | INTRAMUSCULAR | Status: DC | PRN

## 2016-01-20 MED ORDER — OXYCODONE HCL 5 MG PO TABS: 5.0000 mg | ORAL_TABLET | ORAL | Status: DC | PRN

## 2016-01-20 MED ORDER — METFORMIN HCL 500 MG PO TABS
500.0000 mg | ORAL_TABLET | Freq: Two times a day (BID) | ORAL | Status: DC
  Administered 2016-01-20 – 2016-01-21 (×2): 500 mg via ORAL

## 2016-01-20 MED ORDER — DIPHENHYDRAMINE HCL 50 MG/ML IJ SOLN: 25.0000 mg | Freq: Four times a day (QID) | INTRAMUSCULAR | Status: DC | PRN

## 2016-01-20 MED ORDER — BUPIVACAINE-EPINEPHRINE (PF) 0.5% -1:200000 IJ SOLN
INTRAMUSCULAR | Status: DC | PRN
  Administered 2016-01-20: 30 mL via PERINEURAL

## 2016-01-20 SURGICAL SUPPLY — 51 items
APPLIER CLIP 9.375 MED OPEN (MISCELLANEOUS) ×3
BAG DECANTER FOR FLEXI CONT (MISCELLANEOUS) ×3 IMPLANT
BLADE HEX COATED 2.75 (ELECTRODE) ×3 IMPLANT
BLADE SURG 15 STRL LF DISP TIS (BLADE) ×2 IMPLANT
BLADE SURG 15 STRL SS (BLADE) ×1
CANISTER SUCT 1200ML W/VALVE (MISCELLANEOUS) ×3 IMPLANT
CHLORAPREP W/TINT 26ML (MISCELLANEOUS) ×3 IMPLANT
CLIP APPLIE 9.375 MED OPEN (MISCELLANEOUS) ×2 IMPLANT
CLIP TI WIDE RED SMALL 6 (CLIP) IMPLANT
COVER BACK TABLE 60X90IN (DRAPES) ×3 IMPLANT
COVER MAYO STAND STRL (DRAPES) ×3 IMPLANT
COVER PROBE W GEL 5X96 (DRAPES) ×3 IMPLANT
DECANTER SPIKE VIAL GLASS SM (MISCELLANEOUS) IMPLANT
DEVICE DUBIN W/COMP PLATE 8390 (MISCELLANEOUS) ×3 IMPLANT
DRAPE C-ARM 42X72 X-RAY (DRAPES) ×3 IMPLANT
DRAPE LAPAROSCOPIC ABDOMINAL (DRAPES) ×3 IMPLANT
DRAPE UTILITY XL STRL (DRAPES) ×3 IMPLANT
ELECT REM PT RETURN 9FT ADLT (ELECTROSURGICAL) ×3
ELECTRODE REM PT RTRN 9FT ADLT (ELECTROSURGICAL) ×2 IMPLANT
GLOVE BIOGEL PI IND STRL 7.0 (GLOVE) ×2 IMPLANT
GLOVE BIOGEL PI INDICATOR 7.0 (GLOVE) ×1
GLOVE SURG SIGNA 7.5 PF LTX (GLOVE) ×3 IMPLANT
GOWN STRL REUS W/ TWL LRG LVL3 (GOWN DISPOSABLE) ×2 IMPLANT
GOWN STRL REUS W/ TWL XL LVL3 (GOWN DISPOSABLE) ×2 IMPLANT
GOWN STRL REUS W/TWL LRG LVL3 (GOWN DISPOSABLE) ×1
GOWN STRL REUS W/TWL XL LVL3 (GOWN DISPOSABLE) ×1
IV KIT MINILOC 20X1 SAFETY (NEEDLE) IMPLANT
KIT MARKER MARGIN INK (KITS) ×3 IMPLANT
KIT PORT POWER 8FR ISP CVUE (Catheter) ×3 IMPLANT
LIQUID BAND (GAUZE/BANDAGES/DRESSINGS) ×6 IMPLANT
NDL SAFETY ECLIPSE 18X1.5 (NEEDLE) ×2 IMPLANT
NEEDLE HYPO 18GX1.5 SHARP (NEEDLE) ×1
NEEDLE HYPO 25X1 1.5 SAFETY (NEEDLE) ×3 IMPLANT
NS IRRIG 1000ML POUR BTL (IV SOLUTION) ×3 IMPLANT
PACK BASIN DAY SURGERY FS (CUSTOM PROCEDURE TRAY) ×3 IMPLANT
PENCIL BUTTON HOLSTER BLD 10FT (ELECTRODE) ×3 IMPLANT
SLEEVE SCD COMPRESS KNEE MED (MISCELLANEOUS) ×3 IMPLANT
SPONGE GAUZE 4X4 12PLY STER LF (GAUZE/BANDAGES/DRESSINGS) IMPLANT
SPONGE LAP 4X18 X RAY DECT (DISPOSABLE) ×3 IMPLANT
SUT MNCRL AB 4-0 PS2 18 (SUTURE) ×3 IMPLANT
SUT PROLENE 2 0 SH DA (SUTURE) ×3 IMPLANT
SUT SILK 2 0 SH (SUTURE) IMPLANT
SUT SILK 2 0 TIES 17X18 (SUTURE)
SUT SILK 2-0 18XBRD TIE BLK (SUTURE) IMPLANT
SUT VIC AB 3-0 SH 27 (SUTURE) ×1
SUT VIC AB 3-0 SH 27X BRD (SUTURE) ×2 IMPLANT
SYR CONTROL 10ML LL (SYRINGE) ×3 IMPLANT
TOWEL OR 17X24 6PK STRL BLUE (TOWEL DISPOSABLE) ×3 IMPLANT
TOWEL OR NON WOVEN STRL DISP B (DISPOSABLE) ×3 IMPLANT
TUBE CONNECTING 20X1/4 (TUBING) IMPLANT
YANKAUER SUCT BULB TIP NO VENT (SUCTIONS) IMPLANT

## 2016-01-20 NOTE — Progress Notes (Signed)
Assisted Dr. Ermalene Postin with right, ultrasound guided, pectoralis block. Side rails up, monitors on throughout procedure. See vital signs in flow sheet. Tolerated Procedure well.

## 2016-01-20 NOTE — Progress Notes (Signed)
Report given to Kathrynn Speed to assume care

## 2016-01-20 NOTE — Discharge Instructions (Signed)
[facility] °Office Phone Number [phone] ° °BREAST BIOPSY/ PARTIAL MASTECTOMY: POST OP INSTRUCTIONS ° °Always review your discharge instruction sheet given to you by the facility where your surgery was performed. ° °IF YOU HAVE DISABILITY OR FAMILY LEAVE FORMS, YOU MUST BRING THEM TO THE OFFICE FOR PROCESSING.  DO NOT GIVE THEM TO YOUR DOCTOR. ° °1. A prescription for pain medication may be given to you upon discharge.  Take your pain medication as prescribed, if needed.  If narcotic pain medicine is not needed, then you may take acetaminophen (Tylenol) or ibuprofen (Advil) as needed. °2. Take your usually prescribed medications unless otherwise directed °3. If you need a refill on your pain medication, please contact your pharmacy.  They will contact our office to request authorization.  Prescriptions will not be filled after 5pm or on week-ends. °4. You should eat very light the first 24 hours after surgery, such as soup, crackers, pudding, etc.  Resume your normal diet the day after surgery. °5. Most patients will experience some swelling and bruising in the breast.  Ice packs and a good support bra will help.  Swelling and bruising can take several days to resolve.  °6. It is common to experience some constipation if taking pain medication after surgery.  Increasing fluid intake and taking a stool softener will usually help or prevent this problem from occurring.  A mild laxative (Milk of Magnesia or Miralax) should be taken according to package directions if there are no bowel movements after 48 hours. °7. Unless discharge instructions indicate otherwise, you may remove your bandages 24-48 hours after surgery, and you may shower at that time.  You may have steri-strips (small skin tapes) in place directly over the incision.  These strips should be left on the skin for 7-10 days.  If your surgeon used skin glue on the incision, you may shower in 24 hours.  The glue will flake off over the  next 2-3 weeks.  Any sutures or staples will be removed at the office during your follow-up visit. °8. ACTIVITIES:  You may resume regular daily activities (gradually increasing) beginning the next day.  Wearing a good support bra or sports bra minimizes pain and swelling.  You may have sexual intercourse when it is comfortable. °a. You may drive when you no longer are taking prescription pain medication, you can comfortably wear a seatbelt, and you can safely maneuver your car and apply brakes. °b. RETURN TO WORK:  ______________________________________________________________________________________ °9. You should see your doctor in the office for a follow-up appointment approximately two weeks after your surgery.  Your doctor’s nurse will typically make your follow-up appointment when she calls you with your pathology report.  Expect your pathology report 2-3 business days after your surgery.  You may call to check if you do not hear from us after three days. °10. OTHER INSTRUCTIONS: _______________________________________________________________________________________________ _____________________________________________________________________________________________________________________________________ °_____________________________________________________________________________________________________________________________________ °_____________________________________________________________________________________________________________________________________ ° °WHEN TO CALL YOUR DOCTOR: °1. Fever over 101.0 °2. Nausea and/or vomiting. °3. Extreme swelling or bruising. °4. Continued bleeding from incision. °5. Increased pain, redness, or drainage from the incision. ° °The clinic staff is available to answer your questions during regular business hours.  Please don’t hesitate to call and ask to speak to one of the nurses for clinical concerns.  If you have a medical emergency, go to the nearest  emergency room or call 911.  A surgeon from [facility] is always on call at the hospital. ° °For further questions, please visit [email or web address]  °

## 2016-01-20 NOTE — Anesthesia Preprocedure Evaluation (Addendum)
Anesthesia Evaluation  Patient identified by MRN, date of birth, ID band Patient awake    Reviewed: Allergy & Precautions, NPO status , Patient's Chart, lab work & pertinent test results  History of Anesthesia Complications Negative for: history of anesthetic complications  Airway Mallampati: III  TM Distance: >3 FB Neck ROM: Full    Dental  (+) Teeth Intact   Pulmonary neg shortness of breath, neg sleep apnea, neg COPD, neg recent URI, Current Smoker,    breath sounds clear to auscultation       Cardiovascular hypertension, Pt. on medications  Rhythm:Regular     Neuro/Psych PSYCHIATRIC DISORDERS Schizophrenia negative neurological ROS     GI/Hepatic GERD  Medicated,  Endo/Other  diabetes, Type 2, Oral Hypoglycemic Agents  Renal/GU      Musculoskeletal   Abdominal   Peds  Hematology   Anesthesia Other Findings   Reproductive/Obstetrics                           Anesthesia Physical Anesthesia Plan  ASA: III  Anesthesia Plan: General and Regional   Post-op Pain Management: MAC Combined w/ Regional for Post-op pain   Induction: Intravenous  Airway Management Planned: LMA  Additional Equipment: None  Intra-op Plan:   Post-operative Plan: Extubation in OR  Informed Consent: I have reviewed the patients History and Physical, chart, labs and discussed the procedure including the risks, benefits and alternatives for the proposed anesthesia with the patient or authorized representative who has indicated his/her understanding and acceptance.   Dental advisory given  Plan Discussed with: CRNA and Surgeon  Anesthesia Plan Comments:         Anesthesia Quick Evaluation

## 2016-01-20 NOTE — Op Note (Signed)
NAME:  Alison [name], Alison [name]             ACCOUNT NO.:  [id number]  MEDICAL RECORD NO.:  [id number]  LOCATION:  [facility]                          FACILITY:  [facility]  PHYSICIAN:  Coralie Keens, M.D. DATE OF BIRTH:  05/04/1961  DATE OF PROCEDURE:  01/20/2016 DATE OF DISCHARGE:                              OPERATIVE REPORT   PREOPERATIVE DIAGNOSIS:  Right breast cancer.  POSTOPERATIVE DIAGNOSIS:  Right breast cancer.  PROCEDURES: 1. Radioactive seed localized right breast partial mastectomy. 2. Right axillary sentinel lymph node biopsy. 3. Insertion of left subclavian Port-A-Cath.  SURGEON:  Coralie Keens, M.D.  ANESTHESIA:  General with 0.5% Marcaine.  ESTIMATED BLOOD LOSS:  Minimal.  INDICATIONS:  This is a 55 year old female with schizophrenia, who was found to have a right breast cancer.  She now presents for radioactive seed localized partial mastectomy, sentinel node biopsy and Port-A-Cath insertion.  Findings of the radioactive seed and previous marker were indeed in the partial mastectomy specimen.  Two sentinel lymph nodes were removed.  PROCEDURE IN DETAIL:  The patient was brought to the operating room, identified as the correct patient.  I had already identified in the holding area, confirmed that radioactive seed was in the right breast. She was placed supine position on the operating room table and general anesthesia was induced.  Her chest and breasts were prepped and draped bilaterally in usual sterile fashion.  The patient was placed in the Trendelenburg position.  I anesthetized the skin of left chest with lidocaine.  I then used an introducer needle to cannulate the left subclavian vein.  A wire was then passed through the needle and into the central venous system under direct fluoroscopy.  I then removed the needle.  I anesthetized the chest further with Marcaine.  I then made an incision with the scalpel at the introduction site.  I then created a pocket for the  Port-A-Cath.  An 8-French Infuse-A-Port was then brought to the field.  It fit well into the pocket.  I then placed the venous dilator and introducer sheath over the wire and into the central venous system.  The wire and dilator were then removed.  I placed the catheter onto the port, secured in place and cut it an appropriate length.  I then placed the port into the pocket and fed the catheter down the peel- away sheath.  The sheath was then peeled away leaving the catheter in central venous system.  Good placement and the superior vena cava appeared to be achieved under fluoroscopy.  I then accessed the port and good flush and return were demonstrated.  I then instilled concentrated heparin solution into the port.  I then sutured it to the chest wall with two separate 3-0 Prolene sutures.  Subcutaneous tissue was then closed with 3-0 Vicryl sutures.  The skin was closed with running 4-0 Monocryl.  The Neoprobe was then brought into the field.  I identified an area of increased uptake in the right breast.  This was at the 10 o'clock position in the upper outer quadrant of the breast.  I anesthetized the skin with Marcaine and made an incision with a scalpel. I took this down to the breast tissue with  electrocautery.  I then performed a right partial mastectomy of the upper outer quadrant of the breast going all the way down to the chest wall staying well around the palpable mass with the aid of the Neoprobe.  Once the mass was completely excised down to the chest wall, I painted all margins with marker pen.  X-ray was performed and confirmed that the radioactive seed and previous tissue marker were in the biopsy specimen.  This was sent to Pathology for evaluation.  I then identified an area of increased uptake in the right axilla.  I made a separate incision with a scalpel after anesthetized the skin with Marcaine.  I took this down to the axillary tissue and was able with the Neoprobe  to identify two sentinel lymph nodes.  These were excised with cautery and sent to Pathology for evaluation.  I then again examined the nodal basin and found no other increased uptake.  At this point, I irrigated both wounds with saline. I then placed surgical clips around the margins of the partial mastectomy site.  I then closed both incisions with 3-0 Vicryl sutures and then running 4-0 Monocryl sutures.  Skin glue was then applied.  The patient tolerated the procedure well.  All the counts were correct at the end of the procedure.  The patient was then extubated in the operating room and taken in stable condition to the recovery room.     Coralie Keens, M.D.     DB/MEDQ  D:  01/20/2016  T:  01/20/2016  Job:  [id number]

## 2016-01-20 NOTE — Anesthesia Procedure Notes (Addendum)
Anesthesia Regional Block:  Pectoralis block  Pre-Anesthetic Checklist: ,, timeout performed, Correct Patient, Correct Site, Correct Laterality, Correct Procedure, Correct Position, site marked, Risks and benefits discussed,  Surgical consent,  Pre-op evaluation,  At surgeon's request and post-op pain management  Laterality: Right  Prep: chloraprep       Needles:  Injection technique: Single-shot  Needle Type: Echogenic Stimulator Needle          Additional Needles:  Procedures: ultrasound guided (picture in chart) Pectoralis block Narrative:  Injection made incrementally with aspirations every 5 mL.  Performed by: Personally  Anesthesiologist: MOSER, CHRISTOPHER  Additional Notes: H+P and labs reviewed, risks and benefits discussed with patient, procedure tolerated well without complications   Procedure Name: LMA Insertion Date/Time: 01/20/2016 10:40 AM Performed by: Braylen Denunzio D Pre-anesthesia Checklist: Patient identified, Emergency Drugs available, Suction available and Patient being monitored Patient Re-evaluated:Patient Re-evaluated prior to inductionOxygen Delivery Method: Circle System Utilized Preoxygenation: Pre-oxygenation with 100% oxygen Intubation Type: IV induction Ventilation: Mask ventilation without difficulty LMA: LMA inserted LMA Size: 4.0 Number of attempts: 1 Airway Equipment and Method: Bite block Placement Confirmation: positive ETCO2 Tube secured with: Tape Dental Injury: Teeth and Oropharynx as per pre-operative assessment

## 2016-01-20 NOTE — Interval H&P Note (Signed)
History and Physical Interval Note: no change in H and P  01/20/2016 10:19 AM  Alison [name]  has presented today for surgery, with the diagnosis of Right breast cancer   The various methods of treatment have been discussed with the patient and family. After consideration of risks, benefits and other options for treatment, the patient has consented to  Procedure(s): RIGHT BREAST LUMPECTOMY WITH RADIOACTIVE SEED AND SENTINEL LYMPH NODE BIOPSY (Right) INSERTION PORT-A-CATH (N/A) as a surgical intervention .  The patient's history has been reviewed, patient examined, no change in status, stable for surgery.  I have reviewed the patient's chart and labs.  Questions were answered to the patient's satisfaction.     Edder Bellanca A

## 2016-01-20 NOTE — Transfer of Care (Signed)
Immediate Anesthesia Transfer of Care Note  Patient: Alison [name]  Procedure(s) Performed: Procedure(s): RIGHT BREAST LUMPECTOMY WITH RADIOACTIVE SEED AND SENTINEL LYMPH NODE BIOPSY (Right) INSERTION PORT-A-CATH (Left)  Patient Location: PACU  Anesthesia Type:GA combined with regional for post-op pain  Level of Consciousness: awake and patient cooperative  Airway & Oxygen Therapy: Patient Spontanous Breathing and Patient connected to face mask oxygen  Post-op Assessment: Report given to RN and Post -op Vital signs reviewed and stable  Post vital signs: Reviewed and stable  Last Vitals:  Filed Vitals:   01/20/16 1208 01/20/16 1210  BP: 102/65   Pulse:  99  Temp:    Resp:  13    Complications: No apparent anesthesia complications

## 2016-01-20 NOTE — Op Note (Signed)
RIGHT BREAST LUMPECTOMY WITH RADIOACTIVE SEED AND SENTINEL LYMPH NODE BIOPSY, INSERTION PORT-A-CATH  Procedure Note  Alison [name] 01/20/2016   Pre-op Diagnosis: Right breast cancer      Post-op Diagnosis: same  Procedure(s): RIGHT BREAST PARTIAL MASTECTOMY WITH RADIOACTIVE SEED AND SENTINEL LYMPH NODE BIOPSY INSERTION PORT-A-CATH LEFT SUBCLAVIAN VEIN (8 FR)  Surgeon(s): Coralie Keens, MD  Anesthesia: General  Staff:  Circulator: Antionette Poles, RN Radiology Technologist: Wynelle Beckmann Relief Circulator: Forde Dandy, RN Relief Scrub: Vara Guardian, RN Scrub Person: Tresa Res, RN  Estimated Blood Loss: Minimal               Specimens: sent to path          [facility] A   Date: 01/20/2016  Time: 12:07 PM

## 2016-01-20 NOTE — Anesthesia Postprocedure Evaluation (Signed)
Anesthesia Post Note  Patient: Alison [name]  Procedure(s) Performed: Procedure(s) (LRB): RIGHT BREAST LUMPECTOMY WITH RADIOACTIVE SEED AND SENTINEL LYMPH NODE BIOPSY (Right) INSERTION PORT-A-CATH (Left)  Patient location during evaluation: PACU Anesthesia Type: General and Regional Level of consciousness: awake Pain management: pain level controlled Vital Signs Assessment: post-procedure vital signs reviewed and stable Respiratory status: spontaneous breathing and respiratory function stable Cardiovascular status: stable Postop Assessment: no signs of nausea or vomiting Anesthetic complications: no    Last Vitals:  Filed Vitals:   01/20/16 1315 01/20/16 1330  BP: 98/73 108/79  Pulse: 84 84  Temp:    Resp: 18 14    Last Pain: There were no vitals filed for this visit.               Jessicaann Overbaugh

## 2016-01-21 ENCOUNTER — Encounter (HOSPITAL_BASED_OUTPATIENT_CLINIC_OR_DEPARTMENT_OTHER): Payer: Self-pay | Admitting: Surgery

## 2016-01-21 DIAGNOSIS — C50411 Malignant neoplasm of upper-outer quadrant of right female breast: Secondary | ICD-10-CM | POA: Diagnosis not present

## 2016-01-21 NOTE — Progress Notes (Signed)
Patient ID: Alison [name], female   DOB: 1961-12-07, 55 y.o.   MRN: [id number]  Doing well Incisions all stable  Plan: discharge

## 2016-01-21 NOTE — Discharge Summary (Signed)
Physician Discharge Summary  Patient ID: Alison [name] MRN: [id number] DOB/AGE: 07/16/1961 55 y.o.  Admit date: 01/20/2016 Discharge date: 01/21/2016  Admission Diagnoses:  Discharge Diagnoses:  Active Problems:   Breast cancer [facility])   Discharged Condition: good  Hospital Course: uneventful post op recovery  Consults: None  Significant Diagnostic Studies:   Treatments: surgery: right breast partial mastectomy with sentinel node biopsy, left port a cath insertion  Discharge Exam: Blood pressure 144/92, pulse 90, temperature 97.3 F (36.3 C), resp. rate 16, height 5' 0.5" (1.537 m), weight 81.194 kg (179 lb), SpO2 97 %. General appearance: alert, cooperative and no distress Resp: clear to auscultation bilaterally Cardio: regular rate and rhythm, S1, S2 normal, no murmur, click, rub or gallop Incision/Wound:wounds clean  Disposition: 01-Home or Self Care     Medication List    TAKE these medications        acetaminophen 325 MG tablet  Commonly known as:  TYLENOL  Take 650 mg by mouth every 4 (four) hours as needed for mild pain.     benztropine 0.5 MG tablet  Commonly known as:  COGENTIN  Take 0.5 mg by mouth 2 (two) times daily.     divalproex 500 MG 24 hr tablet  Commonly known as:  DEPAKOTE ER  Take 1,000 mg by mouth at bedtime.     furosemide 40 MG tablet  Commonly known as:  LASIX  Take 40 mg by mouth every morning.     lisinopril 10 MG tablet  Commonly known as:  PRINIVIL,ZESTRIL  Take 10 mg by mouth every morning.     metFORMIN 500 MG tablet  Commonly known as:  GLUCOPHAGE  Take by mouth 2 (two) times daily with a meal.     OLANZapine 10 MG tablet  Commonly known as:  ZYPREXA  Take 10 mg by mouth at bedtime.     omeprazole 20 MG capsule  Commonly known as:  PRILOSEC  Take 40 mg by mouth every morning.     oxyCODONE-acetaminophen 5-325 MG tablet  Commonly known as:  ROXICET  Take 1-2 tablets by mouth every 4 (four) hours as needed for  moderate pain or severe pain.     simvastatin 10 MG tablet  Commonly known as:  ZOCOR  Take 10 mg by mouth at bedtime.     SYSTANE 0.4-0.3 % Soln  Generic drug:  Polyethyl Glycol-Propyl Glycol  Apply 1 drop to eye 4 (four) times daily.           Follow-up Information    Follow up with [facility] A, MD. Schedule an appointment as soon as possible for a visit in 2 weeks.   Specialty:  General Surgery   Contact information:   [address] [place] [address] [phone]       Signed: Harl Bowie 01/21/2016, 6:44 AM

## 2016-01-28 NOTE — Assessment & Plan Note (Signed)
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 Breast conserving surgery with sentinel lymph node biopsy 2. Followed by adjuvant chemotherapy (Adriamycin and Cytoxan dose dense 4 followed by Abraxane weekly 12) 3. Followed by radiation  --------------------------------------------------------------------------------------------------------------------------

## 2016-01-29 ENCOUNTER — Ambulatory Visit (HOSPITAL_BASED_OUTPATIENT_CLINIC_OR_DEPARTMENT_OTHER): Payer: Medicaid Other | Admitting: Hematology and Oncology

## 2016-01-29 ENCOUNTER — Telehealth: Payer: Self-pay | Admitting: Hematology and Oncology

## 2016-01-29 ENCOUNTER — Encounter: Payer: Self-pay | Admitting: *Deleted

## 2016-01-29 ENCOUNTER — Other Ambulatory Visit: Payer: Medicaid Other

## 2016-01-29 ENCOUNTER — Other Ambulatory Visit: Payer: Self-pay | Admitting: *Deleted

## 2016-01-29 ENCOUNTER — Encounter: Payer: Self-pay | Admitting: Hematology and Oncology

## 2016-01-29 ENCOUNTER — Ambulatory Visit: Payer: Medicaid Other | Admitting: Genetic Counselor

## 2016-01-29 VITALS — BP 140/78 | HR 98 | Temp 98.1°F | Resp 18 | Ht 60.5 in | Wt 178.6 lb

## 2016-01-29 DIAGNOSIS — C50411 Malignant neoplasm of upper-outer quadrant of right female breast: Secondary | ICD-10-CM

## 2016-01-29 DIAGNOSIS — Z8041 Family history of malignant neoplasm of ovary: Secondary | ICD-10-CM

## 2016-01-29 DIAGNOSIS — Z1379 Encounter for other screening for genetic and chromosomal anomalies: Secondary | ICD-10-CM

## 2016-01-29 DIAGNOSIS — Z1501 Genetic susceptibility to malignant neoplasm of breast: Secondary | ICD-10-CM | POA: Insufficient documentation

## 2016-01-29 DIAGNOSIS — Z803 Family history of malignant neoplasm of breast: Secondary | ICD-10-CM

## 2016-01-29 DIAGNOSIS — Z809 Family history of malignant neoplasm, unspecified: Secondary | ICD-10-CM

## 2016-01-29 DIAGNOSIS — Z1509 Genetic susceptibility to other malignant neoplasm: Secondary | ICD-10-CM | POA: Insufficient documentation

## 2016-01-29 MED ORDER — PROCHLORPERAZINE MALEATE 10 MG PO TABS: 10.0000 mg | ORAL_TABLET | Freq: Four times a day (QID) | ORAL | Status: DC | PRN

## 2016-01-29 MED ORDER — ONDANSETRON HCL 8 MG PO TABS: 8.0000 mg | ORAL_TABLET | Freq: Two times a day (BID) | ORAL | Status: DC | PRN

## 2016-01-29 MED ORDER — LIDOCAINE-PRILOCAINE 2.5-2.5 % EX CREA: TOPICAL_CREAM | CUTANEOUS | Status: DC

## 2016-01-29 NOTE — Progress Notes (Signed)
Unable to get in to exam room prior to MD.  No assessment performed.  

## 2016-01-29 NOTE — Progress Notes (Signed)
GENETIC TEST RESULTS   Patient Name: Ercilia Bettinger Patient Age: 55 y.o. Encounter Date: 01/29/2016  Referring Provider: Serena Croissant, MD    Ms. 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 Please refer to the prior Genetics clinic note from December 24, 2015 for more information regarding Ms. Hitchens's medical and family histories and our assessment at the time.   GENETIC TESTING:  At the time of Ms. Schmieder's visit on 12/24/15, we recommended she pursue genetic testing of the 20 genes on the Breast/Ovarian Cancer Panel through [employer].  The Breast/Ovarian Cancer Panel offered by [facility] Basilio Cairo, MD) includes sequencing and deletion/duplication analysis for the following 19 genes:  ATM, BARD1, BRCA1, BRCA2, BRIP1, CDH1, CHEK2, FANCC, MLH1, MSH2, MSH6, NBN, PALB2, PMS2, PTEN, RAD51C, RAD51D, TP53, and XRCC2.  This panel also includes deletion/duplication analysis (without sequencing) for one gene, EPCAM.  MSH2 Exons 1-7 Inversion Analysis was also performed through [facility] and was negative (Date of report: January 06, 2016).  Panel testing revealed a pathogenic mutation in one copy of the BRCA2 gene called "c.[id number] (p.[id number])".  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  Date of report is January 13, 2016.  MEDICAL MANAGEMENT: Women who have a BRCA2 mutation have an increased risk for both breast and ovarian cancer.   Ms. Ferran is at increased risk of a second breast cancer and ovarian cancer and we, therefore, discussed the recommendations below from the National Comprehensive Cancer Network (NCCN) Guidelines (v 1.2017)  that are specific to women with a BRCA2 mutation. She will need to decide whether she will proceed with increased breast screenings vs. risk-reduction bilateral mastectomies. Ms. Orne believes that, though she has had a hysterectomy, that her ovaries are still intact and we discussed the recommendation for a BSO.  Breast Cancer:  - Starting at age 70: Breast awareness - Women should be familiar with their breasts and promptly report changes to their healthcare provider. Performing regular breast self exams may help increase breast awareness, especially when checked at the end of the menstrual cycle.  - Starting at age 48: Clinical breast exam every 6-12 months. - Between ages 25-29 or individualized based on family history: Breast MRI screening (preferred) every year or mammogram if MRI is unavailable. - Between ages 30-75: Mammogram and breast MRI screening every year.  - >age 54: Management should be individualized - Option of risk-reducing bilateral mastectomies  Ovarian Cancer: - Recommend risk-reducing salpingo-oophorectomy (RRSO), typically between 35 and 109 y, and upon completion of child bearing. Because ovarian cancer onset in patients with BRCA2 mutations is an average of 8-10 years later than in patients with BRCA1 mutations, it is reasonable to delay RRSO until age 54-45 y in patients with BRCA2 mutations who have already maximized their breast cancer prevention (ie, undergone bilateral mastectomy).   - While there may be circumstances where ovarian cancer screening with transvaginal ultrasound and a blood test for a protein called CA-125 are helpful, these techniques have not been shown to be effective in detecting early ovarian cancer and are generally not recommended.  - Consider risk reduction agents as options for breast and ovarian cancer, including discussing risks and benefits.  Other Cancers: We discussed that while literature  has shown other cancers, such as pancreatic  cancer and melanoma, to be associated with BRCA2 mutations, national guidelines do not currently recommend any specific screenings for these cancers. Screening may be individualized based on cancers observed in the family.  There are no known pancreatic or melanoma cancers in the family, although Ms. Burgner's mother was diagnosed with an unspecified type of skin cancer (on her knee) at age 57.    Fanconi Anemia Risk: If an individual inherits two copies of a BRCA2 mutation (one from each parent), this can lead to a condition called Fanconi Anemia.  Fanconi Anemia is a very rare autosomal recessive condition that is characterized by bone marrow failure, physical abnormalities, organ defects, and an increased risk for certain cancers.  We know that this is not the case for Ms. Encarnacion, and, likely, not the case for any of her relatives.  For any relatives with BRCA2 mutations who are planning on having children, however, this can be a further important consideration.  UNCERTAIN CHANGE: Genetic testing also identified a variant of uncertain significance (VUS) called "c.16A>C (p.Thr6Pro)" in one copy of the MSH6 gene. At this time, it is unknown if this VUS is associated with an increased risk for cancer or if this is a normal finding. Since this VUS result is uncertain, it cannot help guide screening recommendations, and family members should not be tested for this VUS to help define their own cancer risks.  Also, we all have variants within our genes that make [place] unique individuals--most of these variants are benign.  Thus, we treat this VUS as a negative result.   With time, we suspect the lab will reclassify this variant and when they do, we will try to re-contact Ms. Cupples to discuss the reclassification further.  We also encouraged Ms. Chrissie Noa to contact us in a year or two to obtain an update on the status of this VUS.  FAMILY MEMBERS: It is important that all of Ms. Bledsoe's relatives (both men  and women) know of the presence of this gene mutation. Site-specific genetic testing can sort out who in the family is at risk and who is not.    We offered genetic testing to Ms. Kathyrn Lass, Jordynne's mother.  It is very, very likely that this BRCA2 mutation has been inherited from her, considering her own personal history of early-onset breast cancer and the maternal family history of other breast, prostate, and ovarian cancers.  Though Ms. Kun's father passed away at 75 and he has a smaller family, there are no known cancers on his side of the family.  Ms. Manson Passey is not interested in genetic testing at this time.  She is 59 and continues to have regular breast screening.  Additionally, she has had a TAH-BSO already.  We discussed that Caddie's brothers, nephews, nieces, aunts (all uncles have passed away), and her maternal cousins would still be eligible for genetic testing themselves.  Most of these relatives still live in [place], [place], so I provided Ms. Manson Passey with several copies of Milica's test report and of my business card to share with these relatives.   Ms. Galligan children and siblings have a 50% chance to have inherited this mutation. We recommend they have genetic testing for this same mutation, as identifying the presence of this mutation would allow them to also take advantage of risk-reducing measures.  However, all of Ms. Hinde's children have been adopted by different families.  Ms. Paulhus and her mother have no knowledge of  where these children have been placed or any way of contacting them.  Family members should not have testing for the MSH6 gene VUS outside of a research setting, since this VUS does not implicate any information for potential cancer risks or any recommendations for particular cancer screening.  SUPPORT AND RESOURCES: If Ms. Homer is interested in BRCA-specific information and support, there are two groups, Facing Our Risk (www.facingourrisk.com) and  Bright Pink (www.brightpink.org) which some people have found useful. They provide opportunities to speak with other individuals from high-risk families. To locate genetic counselors in other cities, visit the website of the [employer] (ArtistMovie.se) and search for a Patent attorney by zip code.  We encouraged Ms. Huettner to remain in contact with [place] on an annual basis so we can update her personal and family histories, and let her know of advances in cancer genetics that may benefit the family. Our contact number was provided. Ms. Matos questions were answered to her satisfaction today, and she knows she is welcome to call anytime with additional questions.    Jeanine Luz, MS Genetic Counselor Phone: [phone] [email or web address]

## 2016-01-29 NOTE — Progress Notes (Signed)
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 Final pathology revealed a 1.7 cm area of invasive ductal carcinoma to lymph nodes were negative. She is healing very well from the surgery and reports no major problems or concerns. She does not take any pain medications. She lives in a group home. She is here today accompanied by her mother.  REVIEW OF SYSTEMS:   Constitutional: Denies fevers, chills or abnormal weight loss Eyes: Denies blurriness of vision Ears, nose, mouth, throat, and face: Denies mucositis or sore throat Respiratory: Denies cough, dyspnea or wheezes Cardiovascular: Denies palpitation, chest discomfort Gastrointestinal:  Denies nausea, heartburn or change in bowel habits Skin: Denies abnormal skin rashes Lymphatics: Denies new lymphadenopathy or easy bruising Neurological:Denies numbness, tingling or new weaknesses Behavioral/Psych: Mood is stable, no new changes  Extremities: No lower extremity edema Breast:  Recent lumpectomy surgeryAll other systems were reviewed with the patient and are negative.  I have reviewed the past medical history, past surgical history, social  history and family history with the patient and they are unchanged from previous note.  ALLERGIES:  has No Known Allergies.  MEDICATIONS:  Current Outpatient Prescriptions  Medication Sig Dispense Refill  . acetaminophen (TYLENOL) 325 MG tablet Take 650 mg by mouth every 4 (four) hours as needed for mild pain.     . benztropine (COGENTIN) 0.5 MG tablet Take 0.5 mg by mouth 2 (two) times daily.    . divalproex (DEPAKOTE ER) 500 MG 24 hr tablet Take 1,000 mg by mouth at bedtime.    . furosemide (LASIX) 40 MG tablet Take 40 mg by mouth every morning.    . lidocaine-prilocaine (EMLA) cream Apply to affected area once 30 g 3  . lisinopril (PRINIVIL,ZESTRIL) 10 MG tablet Take 10 mg by mouth every morning.     . metFORMIN (GLUCOPHAGE) 500 MG tablet Take by mouth 2 (two) times daily with a meal.    . OLANZapine (ZYPREXA) 10 MG tablet Take 10 mg by mouth at bedtime.    Marland Kitchen omeprazole (PRILOSEC) 20 MG capsule Take 40 mg by mouth every morning.    . ondansetron (ZOFRAN) 8 MG tablet Take 1 tablet (8 mg total) by mouth 2 (two) times daily as needed. Start on the third day after chemotherapy. 30 tablet 1  . oxyCODONE-acetaminophen (ROXICET) 5-325 MG tablet Take 1-2 tablets by mouth every 4 (four) hours as needed for moderate pain or severe pain. 30 tablet 0  . Polyethyl Glycol-Propyl Glycol (SYSTANE) 0.4-0.3 % SOLN Apply 1 drop to eye 4 (four) times daily.    . prochlorperazine (COMPAZINE) 10 MG tablet Take 1 tablet (10 mg total) by mouth every 6 (six) hours as needed (  Nausea or vomiting). 30 tablet 1  . simvastatin (ZOCOR) 10 MG tablet Take 10 mg by mouth at bedtime.     No current facility-administered medications for this visit.    PHYSICAL EXAMINATION: ECOG PERFORMANCE STATUS: 1 - Symptomatic but completely ambulatory  Filed Vitals:   01/29/16 1020  BP: 140/78  Pulse: 98  Temp: 98.1 F (36.7 C)  Resp: 18   Filed Weights   01/29/16 1020  Weight: 178 lb 9.6 oz (81.012 kg)    GENERAL:alert,  no distress and comfortable SKIN: skin color, texture, turgor are normal, no rashes or significant lesions EYES: normal, Conjunctiva are pink and non-injected, sclera clear OROPHARYNX:no exudate, no erythema and lips, buccal mucosa, and tongue normal  NECK: supple, thyroid normal size, non-tender, without nodularity LYMPH:  no palpable lymphadenopathy in the cervical, axillary or inguinal LUNGS: clear to auscultation and percussion with normal breathing effort HEART: regular rate & rhythm and no murmurs and no lower extremity edema ABDOMEN:abdomen soft, non-tender and normal bowel sounds MUSCULOSKELETAL:no cyanosis of digits and no clubbing  NEURO: alert & oriented x 3 with fluent speech, no focal motor/sensory deficits EXTREMITIES: No lower extremity edema   LABORATORY DATA:  I have reviewed the data as listed   Chemistry      Component Value Date/Time   NA 145 01/14/2016 1420   K 4.3 01/14/2016 1420   CL 102 01/14/2016 1420   CO2 28 01/14/2016 1420   BUN 7 01/14/2016 1420   CREATININE 0.77 01/14/2016 1420      Component Value Date/Time   CALCIUM 9.2 01/14/2016 1420   ALKPHOS 76 04/17/2014 0345   AST 59* 04/17/2014 0345   ALT 66* 04/17/2014 0345   BILITOT <0.2* 04/17/2014 0345       Lab Results  Component Value Date   WBC 7.7 04/17/2014   HGB 11.3* 04/17/2014   HCT 33.5* 04/17/2014   MCV 84.8 04/17/2014   PLT 234 04/17/2014   NEUTROABS 2.2 12/27/2007     ASSESSMENT & PLAN:  Breast cancer of upper-outer quadrant of right female breast ([place]) Rt Lumpectomy 01/20/16: 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  Treatment Plan: 1. Adjuvant chemotherapy (Adriamycin and Cytoxan dose dense 4 followed by Abraxane weekly  12) 2. 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 I recommended starting adjuvant chemotherapy on 02/11/2016 Patient will need a chemotherapy class Echocardiogram  BRCA2: Rec Salpingo oophorectomy I will see her back on 02/11/2016 for first cycle of chemotherapy.   Orders Placed This Encounter  Procedures  . CBC with Differential    Standing Status: Standing     Number of Occurrences: 20     Standing Expiration Date: 01/29/2017  . Comprehensive metabolic panel    Standing Status: Standing     Number of Occurrences: 20     Standing Expiration Date: 01/29/2017  . PHYSICIAN COMMUNICATION ORDER    A baseline Echo/ [place] should be obtained prior to initiation of Anthracycline Chemotherapy   The patient has a  good understanding of the overall plan. she agrees with it. she will call with any problems that may develop before the next visit here.   Rulon Eisenmenger, MD 01/29/2016

## 2016-01-29 NOTE — Telephone Encounter (Signed)
Apt made and avs printed

## 2016-01-30 ENCOUNTER — Encounter: Payer: Self-pay | Admitting: Hematology and Oncology

## 2016-02-04 ENCOUNTER — Telehealth (HOSPITAL_COMMUNITY): Payer: Self-pay | Admitting: Vascular Surgery

## 2016-02-04 NOTE — Telephone Encounter (Signed)
Spoke to [employer] [facility] to schedule pt appt

## 2016-02-08 ENCOUNTER — Ambulatory Visit (HOSPITAL_COMMUNITY)
Admission: RE | Admit: 2016-02-08 | Discharge: 2016-02-08 | Disposition: A | Discharge status: Home / Self Care | Payer: Medicaid Other | Source: Ambulatory Visit | Attending: Family Medicine | Admitting: Family Medicine

## 2016-02-08 ENCOUNTER — Ambulatory Visit (HOSPITAL_BASED_OUTPATIENT_CLINIC_OR_DEPARTMENT_OTHER)
Admission: RE | Admit: 2016-02-08 | Discharge: 2016-02-08 | Disposition: A | Discharge status: Home / Self Care | Payer: Medicaid Other | Source: Ambulatory Visit | Attending: Cardiology | Admitting: Cardiology

## 2016-02-08 ENCOUNTER — Encounter (HOSPITAL_COMMUNITY): Payer: Self-pay

## 2016-02-08 VITALS — BP 108/82 | HR 88 | Wt 176.8 lb

## 2016-02-08 DIAGNOSIS — I119 Hypertensive heart disease without heart failure: Secondary | ICD-10-CM | POA: Diagnosis not present

## 2016-02-08 DIAGNOSIS — I10 Essential (primary) hypertension: Secondary | ICD-10-CM | POA: Diagnosis not present

## 2016-02-08 DIAGNOSIS — C50411 Malignant neoplasm of upper-outer quadrant of right female breast: Secondary | ICD-10-CM | POA: Insufficient documentation

## 2016-02-08 DIAGNOSIS — I313 Pericardial effusion (noninflammatory): Secondary | ICD-10-CM | POA: Diagnosis not present

## 2016-02-08 DIAGNOSIS — I429 Cardiomyopathy, unspecified: Secondary | ICD-10-CM | POA: Diagnosis present

## 2016-02-08 DIAGNOSIS — E785 Hyperlipidemia, unspecified: Secondary | ICD-10-CM | POA: Insufficient documentation

## 2016-02-08 DIAGNOSIS — E119 Type 2 diabetes mellitus without complications: Secondary | ICD-10-CM | POA: Diagnosis not present

## 2016-02-08 NOTE — Progress Notes (Addendum)
Advanced Heart Failure Medication Review by a Pharmacist  Does the patient  feel that his/her medications are working for him/her?  yes  Has the patient been experiencing any side effects to the medications prescribed?  no  Does the patient measure his/her own blood pressure or blood glucose at home?  Has BP taken qMon and says they are good but does not remember numbers   Does the patient have any problems obtaining medications due to transportation or finances?   no  Understanding of regimen: poor Understanding of indications: fair Potential of compliance: medications administered by group home workers Patient understands to avoid NSAIDs. Patient understands to avoid decongestants.  Issues to address at subsequent visits: N/A   Pharmacist comments: Ms. Runnels is a pleasant 55 yo woman who is here for HF clinic visit with her mother. She has a history of schizophrenia and slight developmental delays. She did not have her medication list with her at this visit and was only able to recall her psych-related medications. She remembers taking her medications in the mornings and at 7 PM at night, all of which are administered by her group home workers. She denies any side effects, however she does exhibit some shakiness. Per Ms. Vidas, the shakiness comes and goes and does not interfere with her regular abilities.  She lives at [facility] group [phone]). Spoke with employee at the group home who verified that Ms. Gerstel receives all medications, denied any issues, missed doses, or side effects.    Heloise Ochoa, [place].D., BCPS PGY2 Cardiology Pharmacy Resident Pager: [phone]   Time with patient: 15 min Preparation and documentation time: 25 min Total time: 40 min

## 2016-02-08 NOTE — Patient Instructions (Signed)
Follow up and Echo with Dr.McLean in 2 months

## 2016-02-08 NOTE — Progress Notes (Signed)
  Echocardiogram 2D Echocardiogram has been performed.  Jennette Dubin 02/08/2016, 3:04 PM

## 2016-02-10 NOTE — Assessment & Plan Note (Signed)
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 Adjuvant chemotherapy (Adriamycin and Cytoxan dose dense 4 followed by Abraxane weekly 12) 2. Followed by radiation  -------------------------------------------------------------------------------------------------------------------------- Current Treatment: Cycle 1 day 1 Dose dense Adriamycin and Cytoxan BRCA2: Rec Salpingo oophorectomy ECHO 02/08/16: EF 55%  Chemo Toxicities:  RTC in 1 week for toxicity check

## 2016-02-10 NOTE — Progress Notes (Signed)
Patient ID: Suhailah [name], female   DOB: [birth year], 55 y.o.   MRN: [id number] PCP: Dr. Loleta Chance Oncologist: Dr. Pamelia Hoit  55 yo with history of HTN, diabetes, schizophrenia, and recent breast cancer diagnosis presents for cardio-oncology clinic evaluation.  Breast cancer was diagnosed 2/17 with right lumpectomy. ER-/PR-/HER2-.  BRCA2+.  She will start chemotherapy in 3/17 with Adriamycin + Cytoxan x 4, then abraxane weekly x 12.  This will be followed by radiation.   She has no history of cardiac problems.  She used to smoke but quit 01/25/16.  No exertional dyspnea or chest pain.   ECG: NSR, nonspecific T wave flattening  PMH: 1. HTN 2. Diabetes 3. Hyperlipidemia 4. Schizophrenia: Lives in group home.  5. Breast cancer: Diagnosed 2/17 with right lumpectomy. ER-/PR-/HER2-.  BRCA2+.  She will start chemotherapy in 3/17 with Adriamycin + Cytoxan x 4, then abraxane weekly x 12.  This will be followed by radiation.  - Echo (2/17) with EF 55%, lateral s' 8.8 cm/sec, GLS -19.6%, normal RV size and systolic function.   SH: Lives in group home.  Mother brings her to appts.  Quit smoking in 01/25/16.   FH: Breast cancer, no CAD.  ROS: All systems reviewed and negative except as per HPI.   Current Outpatient Prescriptions  Medication Sig Dispense Refill  . benztropine (COGENTIN) 0.5 MG tablet Take 0.5 mg by mouth 2 (two) times daily.    . divalproex (DEPAKOTE ER) 500 MG 24 hr tablet Take 1,000 mg by mouth at bedtime.    . furosemide (LASIX) 40 MG tablet Take 40 mg by mouth every morning.    Marland Kitchen lisinopril (PRINIVIL,ZESTRIL) 10 MG tablet Take 10 mg by mouth every morning.     . metFORMIN (GLUCOPHAGE) 500 MG tablet Take by mouth 2 (two) times daily with a meal.    . OLANZapine (ZYPREXA) 10 MG tablet Take 10 mg by mouth at bedtime.    Marland Kitchen omeprazole (PRILOSEC) 20 MG capsule Take 40 mg by mouth every morning.    . simvastatin (ZOCOR) 10 MG tablet Take 10 mg by mouth at bedtime.    Marland Kitchen acetaminophen (TYLENOL)  325 MG tablet Take 650 mg by mouth every 4 (four) hours as needed for mild pain.     Marland Kitchen lidocaine-prilocaine (EMLA) cream Apply to affected area once 30 g 3  . ondansetron (ZOFRAN) 8 MG tablet Take 1 tablet (8 mg total) by mouth 2 (two) times daily as needed. Start on the third day after chemotherapy. 30 tablet 1  . oxyCODONE-acetaminophen (ROXICET) 5-325 MG tablet Take 1-2 tablets by mouth every 4 (four) hours as needed for moderate pain or severe pain. 30 tablet 0  . Polyethyl Glycol-Propyl Glycol (SYSTANE) 0.4-0.3 % SOLN Apply 1 drop to eye 4 (four) times daily.    . prochlorperazine (COMPAZINE) 10 MG tablet Take 1 tablet (10 mg total) by mouth every 6 (six) hours as needed (Nausea or vomiting). 30 tablet 1   No current facility-administered medications for this encounter.   BP 108/82 mmHg  Pulse 88  Wt 176 lb 12 oz (80.173 kg)  SpO2 99% General: NAD Neck: No JVD, no thyromegaly or thyroid nodule.  Lungs: Clear to auscultation bilaterally with normal respiratory effort. CV: Nondisplaced PMI.  Heart regular S1/S2, no S3/S4, no murmur.  No peripheral edema.  No carotid bruit.  Normal pedal pulses.  Abdomen: Soft, nontender, no hepatosplenomegaly, no distention.  Skin: Intact without lesions or rashes.  Neurologic: Alert and oriented x 3.  Psych:  Normal affect. Extremities: No clubbing or cyanosis.  HEENT: Normal.   Assessment/Plan: 1. Breast cancer: Her main cardiac risk will be from Adriamycin.  She will get 4 cycles.  We have a baseline echo.  I reviewed this today, EF and global longitudinal strain appear normal.  I will repeat echo in 2 months with office visit. 2. HTN: BP is controlled on lisinopril.  Loralie Champagne 02/10/2016

## 2016-02-11 ENCOUNTER — Encounter: Payer: Self-pay | Admitting: Hematology and Oncology

## 2016-02-11 ENCOUNTER — Telehealth: Payer: Self-pay | Admitting: Hematology and Oncology

## 2016-02-11 ENCOUNTER — Ambulatory Visit (HOSPITAL_BASED_OUTPATIENT_CLINIC_OR_DEPARTMENT_OTHER): Payer: Medicaid Other

## 2016-02-11 ENCOUNTER — Ambulatory Visit (HOSPITAL_BASED_OUTPATIENT_CLINIC_OR_DEPARTMENT_OTHER): Payer: Medicaid Other | Admitting: Hematology and Oncology

## 2016-02-11 ENCOUNTER — Telehealth: Payer: Self-pay | Admitting: *Deleted

## 2016-02-11 ENCOUNTER — Other Ambulatory Visit (HOSPITAL_BASED_OUTPATIENT_CLINIC_OR_DEPARTMENT_OTHER): Payer: Medicaid Other

## 2016-02-11 VITALS — BP 113/77 | HR 95 | Temp 97.7°F | Resp 19 | Ht 60.5 in | Wt 175.0 lb

## 2016-02-11 DIAGNOSIS — Z5189 Encounter for other specified aftercare: Secondary | ICD-10-CM

## 2016-02-11 DIAGNOSIS — C773 Secondary and unspecified malignant neoplasm of axilla and upper limb lymph nodes: Secondary | ICD-10-CM | POA: Diagnosis not present

## 2016-02-11 DIAGNOSIS — Z171 Estrogen receptor negative status [ER-]: Secondary | ICD-10-CM

## 2016-02-11 DIAGNOSIS — C50411 Malignant neoplasm of upper-outer quadrant of right female breast: Secondary | ICD-10-CM | POA: Diagnosis present

## 2016-02-11 DIAGNOSIS — Z1501 Genetic susceptibility to malignant neoplasm of breast: Secondary | ICD-10-CM

## 2016-02-11 DIAGNOSIS — Z1509 Genetic susceptibility to other malignant neoplasm: Secondary | ICD-10-CM

## 2016-02-11 DIAGNOSIS — Z5111 Encounter for antineoplastic chemotherapy: Secondary | ICD-10-CM | POA: Diagnosis present

## 2016-02-11 LAB — COMPREHENSIVE METABOLIC PANEL
ALT: 22 U/L (ref 0–55)
AST: 23 U/L (ref 5–34)
Albumin: 3.7 g/dL (ref 3.5–5.0)
Alkaline Phosphatase: 83 U/L (ref 40–150)
Anion Gap: 13 mEq/L — ABNORMAL HIGH (ref 3–11)
BUN: 7.1 mg/dL (ref 7.0–26.0)
CO2: 23 mEq/L (ref 22–29)
Calcium: 9.5 mg/dL (ref 8.4–10.4)
Chloride: 105 mEq/L (ref 98–109)
Creatinine: 0.9 mg/dL (ref 0.6–1.1)
EGFR: 79 mL/min/{1.73_m2} — ABNORMAL LOW (ref >90)
Glucose: 170 mg/dl — ABNORMAL HIGH (ref 70–140)
Potassium: 3.9 mEq/L (ref 3.5–5.1)
Sodium: 141 mEq/L (ref 136–145)
Total Bilirubin: <0.3 mg/dL (ref 0.20–1.20)
Total Protein: 7.5 g/dL (ref 6.4–8.3)

## 2016-02-11 LAB — CBC WITH DIFFERENTIAL/PLATELET
BASO%: 0.6 % (ref 0.0–2.0)
Basophils Absolute: 0 10*3/uL (ref 0.0–0.1)
EOS%: 4.2 % (ref 0.0–7.0)
Eosinophils Absolute: 0.2 10*3/uL (ref 0.0–0.5)
HCT: 37 % (ref 34.8–46.6)
HGB: 12.4 g/dL (ref 11.6–15.9)
LYMPH%: 50 % — ABNORMAL HIGH (ref 14.0–49.7)
MCH: 28.7 pg (ref 25.1–34.0)
MCHC: 33.5 g/dL (ref 31.5–36.0)
MCV: 85.6 fL (ref 79.5–101.0)
MONO#: 0.2 10*3/uL (ref 0.1–0.9)
MONO%: 4.4 % (ref 0.0–14.0)
NEUT#: 2 10*3/uL (ref 1.5–6.5)
NEUT%: 40.8 % (ref 38.4–76.8)
Platelets: 302 10*3/uL (ref 145–400)
RBC: 4.32 10*6/uL (ref 3.70–5.45)
RDW: 12.9 % (ref 11.2–14.5)
WBC: 5 10*3/uL (ref 3.9–10.3)
lymph#: 2.5 10*3/uL (ref 0.9–3.3)

## 2016-02-11 MED ORDER — DOXORUBICIN HCL CHEMO IV INJECTION 2 MG/ML
60.0000 mg/m2 | Freq: Once | INTRAVENOUS | Status: AC
  Administered 2016-02-11: 112 mg via INTRAVENOUS
  Filled 2016-02-11: qty 56

## 2016-02-11 MED ORDER — HEPARIN SOD (PORK) LOCK FLUSH 100 UNIT/ML IV SOLN
500.0000 [IU] | Freq: Once | INTRAVENOUS | Status: AC | PRN
  Administered 2016-02-11: 500 [IU]
  Filled 2016-02-11: qty 5

## 2016-02-11 MED ORDER — SODIUM CHLORIDE 0.9 % IV SOLN
Freq: Once | INTRAVENOUS | Status: AC
  Administered 2016-02-11: 11:00:00 via INTRAVENOUS
  Filled 2016-02-11: qty 5

## 2016-02-11 MED ORDER — SODIUM CHLORIDE 0.9 % IV SOLN
Freq: Once | INTRAVENOUS | Status: AC
  Administered 2016-02-11: 10:00:00 via INTRAVENOUS

## 2016-02-11 MED ORDER — SODIUM CHLORIDE 0.9% FLUSH
10.0000 mL | INTRAVENOUS | Status: DC | PRN
  Administered 2016-02-11: 10 mL
  Filled 2016-02-11: qty 10

## 2016-02-11 MED ORDER — SODIUM CHLORIDE 0.9 % IV SOLN
600.0000 mg/m2 | Freq: Once | INTRAVENOUS | Status: AC
  Administered 2016-02-11: 1120 mg via INTRAVENOUS
  Filled 2016-02-11: qty 56

## 2016-02-11 MED ORDER — PALONOSETRON HCL INJECTION 0.25 MG/5ML
INTRAVENOUS | Status: AC
  Filled 2016-02-11: qty 5

## 2016-02-11 MED ORDER — PALONOSETRON HCL INJECTION 0.25 MG/5ML
0.2500 mg | Freq: Once | INTRAVENOUS | Status: AC
  Administered 2016-02-11: 0.25 mg via INTRAVENOUS

## 2016-02-11 MED ORDER — PEGFILGRASTIM 6 MG/0.6ML ~~LOC~~ PSKT
6.0000 mg | PREFILLED_SYRINGE | Freq: Once | SUBCUTANEOUS | Status: AC
  Administered 2016-02-11: 6 mg via SUBCUTANEOUS
  Filled 2016-02-11: qty 0.6

## 2016-02-11 NOTE — Progress Notes (Signed)
Patient Care Team: Iona Beard, MD as PCP - General (Family Medicine)  SUMMARY OF ONCOLOGIC HISTORY:   Breast cancer of upper-outer quadrant of right female breast ([place])   11/25/2015 Mammogram Right breast mass 12 cm from the nipple: 1.4 x 1.3 x 0.9 cm, two right axillary lymph nodes with borderline cortical thickening measuring up to 3 mm   12/04/2015 Initial Diagnosis Right breast biopsy 10:00: Invasive ductal carcinoma with DCIS, grade 2, ER 0%, PR 0%, HER-2 negative, ratio1.31, Ki-67 40%   01/13/2016 Procedure 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   01/20/2016 Surgery Rt Lumpectomy: IDC 1.7 cm, grade 3, with DCIS, 0/2 LN, ER 0%, PR 0%, Her 2 Neg Ratio 1.35, Ki 67: 40%, T1CN0 (stage 1A)   02/11/2016 -  Chemotherapy Adjuvant chemotherapy with dose dense Adriamycin and Cytoxan 4 followed by Abraxane weekly 12    CHIEF COMPLIANT: Cycle 1 day 1 dose dense Adriamycin Cytoxan  INTERVAL HISTORY: Alison [name] is a 55 year old with above-mentioned C right breast cancer currently starting adjuvant chemotherapy with dose dense Adriamycin and Cytoxan. Her port has been placed and she underwent echocardiogram which was normal. She is ready to start treatment today. She is accompanied by her mother. 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 She had undergone chemotherapy education and appears to understand the treatment fairly well.  REVIEW OF SYSTEMS:   Constitutional: Denies fevers, chills or abnormal weight loss Eyes: Denies blurriness of vision Ears, nose, mouth, throat, and face: Denies mucositis or sore throat Respiratory: Denies cough, dyspnea or wheezes Cardiovascular: Denies palpitation, chest discomfort Gastrointestinal:  Denies nausea, heartburn or change  in bowel habits Skin: Denies abnormal skin rashes Lymphatics: Denies new lymphadenopathy or easy bruising Neurological:Denies numbness, tingling or new weaknesses Behavioral/Psych: Mood is stable, no new changes  Extremities: No lower extremity edema Breast: denies any pain or lumps or nodules in either breasts All other systems were reviewed with the patient and are negative.  I have reviewed the past medical history, past surgical history, social history and family history with the patient and they are unchanged from previous note.  ALLERGIES:  has No Known Allergies.  MEDICATIONS:  Current Outpatient Prescriptions  Medication Sig Dispense Refill  . acetaminophen (TYLENOL) 325 MG tablet Take 650 mg by mouth every 4 (four) hours as needed for mild pain.     . benztropine (COGENTIN) 0.5 MG tablet Take 0.5 mg by mouth 2 (two) times daily.    . divalproex (DEPAKOTE ER) 500 MG 24 hr tablet Take 1,000 mg by mouth at bedtime.    . furosemide (LASIX) 40 MG tablet Take 40 mg by mouth every morning.    . lidocaine-prilocaine (EMLA) cream Apply to affected area once 30 g 3  . lisinopril (PRINIVIL,ZESTRIL) 10 MG tablet Take 10 mg by mouth every morning.     . metFORMIN (GLUCOPHAGE) 500 MG tablet Take by mouth 2 (two) times daily with a meal.    . OLANZapine (ZYPREXA) 10 MG tablet Take 10 mg by mouth at bedtime.    Marland Kitchen omeprazole (PRILOSEC) 20 MG capsule Take 40 mg by mouth every morning.    . ondansetron (ZOFRAN) 8 MG tablet Take 1 tablet (8 mg total) by mouth 2 (two) times daily as needed. Start on the third day after chemotherapy. 30 tablet 1  .  oxyCODONE-acetaminophen (ROXICET) 5-325 MG tablet Take 1-2 tablets by mouth every 4 (four) hours as needed for moderate pain or severe pain. 30 tablet 0  . Polyethyl Glycol-Propyl Glycol (SYSTANE) 0.4-0.3 % SOLN Apply 1 drop to eye 4 (four) times daily.    . prochlorperazine (COMPAZINE) 10 MG tablet Take 1 tablet (10 mg total) by mouth every 6 (six) hours  as needed (Nausea or vomiting). 30 tablet 1  . simvastatin (ZOCOR) 10 MG tablet Take 10 mg by mouth at bedtime.     No current facility-administered medications for this visit.    PHYSICAL EXAMINATION: ECOG PERFORMANCE STATUS: 1 - Symptomatic but completely ambulatory  Filed Vitals:   02/11/16 0939  BP: 113/77  Pulse: 95  Temp: 97.7 F (36.5 C)  Resp: 19   Filed Weights   02/11/16 0939  Weight: 175 lb (79.379 kg)    GENERAL:alert, no distress and comfortable SKIN: skin color, texture, turgor are normal, no rashes or significant lesions EYES: normal, Conjunctiva are pink and non-injected, sclera clear OROPHARYNX:no exudate, no erythema and lips, buccal mucosa, and tongue normal  NECK: supple, thyroid normal size, non-tender, without nodularity LYMPH:  no palpable lymphadenopathy in the cervical, axillary or inguinal LUNGS: clear to auscultation and percussion with normal breathing effort HEART: regular rate & rhythm and no murmurs and no lower extremity edema ABDOMEN:abdomen soft, non-tender and normal bowel sounds MUSCULOSKELETAL:no cyanosis of digits and no clubbing  NEURO: alert & oriented x 3 with fluent speech, no focal motor/sensory deficits EXTREMITIES: No lower extremity edema  LABORATORY DATA:  I have reviewed the data as listed   Chemistry      Component Value Date/Time   NA 145 01/14/2016 1420   K 4.3 01/14/2016 1420   CL 102 01/14/2016 1420   CO2 28 01/14/2016 1420   BUN 7 01/14/2016 1420   CREATININE 0.77 01/14/2016 1420      Component Value Date/Time   CALCIUM 9.2 01/14/2016 1420   ALKPHOS 76 04/17/2014 0345   AST 59* 04/17/2014 0345   ALT 66* 04/17/2014 0345   BILITOT <0.2* 04/17/2014 0345       Lab Results  Component Value Date   WBC 5.0 02/11/2016   HGB 12.4 02/11/2016   HCT 37.0 02/11/2016   MCV 85.6 02/11/2016   PLT 302 02/11/2016   NEUTROABS 2.0 02/11/2016   ASSESSMENT & PLAN:  Breast cancer of upper-outer quadrant of right female  breast ([place]) Rt Lumpectomy 01/20/16: 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 Adjuvant chemotherapy (Adriamycin and Cytoxan dose dense 4 followed by Abraxane weekly 12) 2. Followed by radiation  -------------------------------------------------------------------------------------------------------------------------- Current Treatment: Cycle 1 day 1 Dose dense Adriamycin and Cytoxan BRCA2: Rec Salpingo oophorectomy ECHO 02/08/16: EF 55% I reviewed her antiemetics regimen in detail. We will be monitoring her closely for toxicities. I did not prescribe dexamethasone because of her diabetes. Did not prescribe Ativan either RTC in 1 week for toxicity check  No orders of the defined types were placed in this encounter.   The patient has a good understanding of  the overall plan. she agrees with it. she will call with any problems that may develop before the next visit here.   Rulon Eisenmenger, MD 02/11/2016

## 2016-02-11 NOTE — Progress Notes (Signed)
Pt tolerated her first time chemo with Adriamycin and Cytoxan w/o any complaints or complications. Pt also educated on ONPRO neulasta and what to expect in the next day or two. Reviewed nausea medications with pt. Verbalized understanding of treatment and what medication for nausea she needs to take in the next few days. Also reminded pt to hydrate well after chemo, and to expect to have some red or pink tinged color in her urine from the Adriamycin. Told pt that she may experience some fatigue, n/v, bone pain (onpro) over the next few days. Pt understands parameters on when to call if they have any concerns. Mother at chair side and verbally confirmed understanding of after chemo management and care.Follow up phone call placed. AVS schedule printed for pt.

## 2016-02-11 NOTE — Progress Notes (Signed)
Unable to get in to exam room prior to MD.  No assessment performed.  

## 2016-02-11 NOTE — Patient Instructions (Signed)
[facility] Discharge Instructions for Patients Receiving Chemotherapy  Today you received the following chemotherapy agents Adriamycin/ Cytoxan  To help prevent nausea and vomiting after your treatment, we encourage you to take your nausea medication as directed by your MD.   If you develop nausea and vomiting that is not controlled by your nausea medication, call the clinic.   BELOW ARE SYMPTOMS THAT SHOULD BE REPORTED IMMEDIATELY:  *FEVER GREATER THAN 100.5 F  *CHILLS WITH OR WITHOUT FEVER  NAUSEA AND VOMITING THAT IS NOT CONTROLLED WITH YOUR NAUSEA MEDICATION  *UNUSUAL SHORTNESS OF BREATH  *UNUSUAL BRUISING OR BLEEDING  TENDERNESS IN MOUTH AND THROAT WITH OR WITHOUT PRESENCE OF ULCERS  *URINARY PROBLEMS  *BOWEL PROBLEMS  UNUSUAL RASH Items with * indicate a potential emergency and should be followed up as soon as possible.  Feel free to call the clinic you have any questions or concerns. The clinic phone number is [phone].  Please show the [facility] at check-in to the Emergency Department and triage nurse.  Doxorubicin injection Adriamycin What is this medicine? DOXORUBICIN (dox oh ROO bi sin) is a chemotherapy drug. It is used to treat many kinds of cancer like Hodgkin's disease, leukemia, non-Hodgkin's lymphoma, neuroblastoma, sarcoma, and Wilms' tumor. It is also used to treat bladder cancer, breast cancer, lung cancer, ovarian cancer, stomach cancer, and thyroid cancer. This medicine may be used for other purposes; ask your health care provider or pharmacist if you have questions. What should I tell my health care provider before I take this medicine? They need to know if you have any of these conditions: -blood disorders -heart disease, recent heart attack -infection (especially a virus infection such as chickenpox, cold sores, or herpes) -irregular heartbeat -liver disease -recent or ongoing radiation therapy -an unusual or allergic  reaction to doxorubicin, other chemotherapy agents, other medicines, foods, dyes, or preservatives -pregnant or trying to get pregnant -breast-feeding How should I use this medicine? This drug is given as an infusion into a vein. It is administered in a hospital or clinic by a specially trained health care professional. If you have pain, swelling, burning or any unusual feeling around the site of your injection, tell your health care professional right away. Talk to your pediatrician regarding the use of this medicine in children. Special care may be needed. Overdosage: If you think you have taken too much of this medicine contact a poison control center or emergency room at once. NOTE: This medicine is only for you. Do not share this medicine with others. What if I miss a dose? It is important not to miss your dose. Call your doctor or health care professional if you are unable to keep an appointment. What may interact with this medicine? Do not take this medicine with any of the following medications: -cisapride -droperidol -halofantrine -pimozide -zidovudine This medicine may also interact with the following medications: -chloroquine -chlorpromazine -clarithromycin -cyclophosphamide -cyclosporine -erythromycin -medicines for depression, anxiety, or psychotic disturbances -medicines for irregular heart beat like amiodarone, bepridil, dofetilide, encainide, flecainide, propafenone, quinidine -medicines for seizures like ethotoin, fosphenytoin, phenytoin -medicines for nausea, vomiting like dolasetron, ondansetron, palonosetron -medicines to increase blood counts like filgrastim, pegfilgrastim, sargramostim -methadone -methotrexate -pentamidine -progesterone -vaccines -verapamil Talk to your doctor or health care professional before taking any of these medicines: -acetaminophen -aspirin -ibuprofen -ketoprofen -naproxen This list may not describe all possible interactions.  Give your health care provider a list of all the medicines, herbs, non-prescription drugs, or dietary supplements you  use. Also tell them if you smoke, drink alcohol, or use illegal drugs. Some items may interact with your medicine. What should I watch for while using this medicine? Your condition will be monitored carefully while you are receiving this medicine. You will need important blood work done while you are taking this medicine. This drug may make you feel generally unwell. This is not uncommon, as chemotherapy can affect healthy cells as well as cancer cells. Report any side effects. Continue your course of treatment even though you feel ill unless your doctor tells you to stop. Your urine may turn red for a few days after your dose. This is not blood. If your urine is dark or brown, call your doctor. In some cases, you may be given additional medicines to help with side effects. Follow all directions for their use. Call your doctor or health care professional for advice if you get a fever, chills or sore throat, or other symptoms of a cold or flu. Do not treat yourself. This drug decreases your body's ability to fight infections. Try to avoid being around people who are sick. This medicine may increase your risk to bruise or bleed. Call your doctor or health care professional if you notice any unusual bleeding. Be careful brushing and flossing your teeth or using a toothpick because you may get an infection or bleed more easily. If you have any dental work done, tell your dentist you are receiving this medicine. Avoid taking products that contain aspirin, acetaminophen, ibuprofen, naproxen, or ketoprofen unless instructed by your doctor. These medicines may hide a fever. Men and women of childbearing age should use effective birth control methods while using taking this medicine. Do not become pregnant while taking this medicine. There is a potential for serious side effects to an unborn child.  Talk to your health care professional or pharmacist for more information. Do not breast-feed an infant while taking this medicine. Do not let others touch your urine or other body fluids for 5 days after each treatment with this medicine. Caregivers should wear latex gloves to avoid touching body fluids during this time. There is a maximum amount of this medicine you should receive throughout your life. The amount depends on the medical condition being treated and your overall health. Your doctor will watch how much of this medicine you receive in your lifetime. Tell your doctor if you have taken this medicine before. What side effects may I notice from receiving this medicine? Side effects that you should report to your doctor or health care professional as soon as possible: -allergic reactions like skin rash, itching or hives, swelling of the face, lips, or tongue -low blood counts - this medicine may decrease the number of white blood cells, red blood cells and platelets. You may be at increased risk for infections and bleeding. -signs of infection - fever or chills, cough, sore throat, pain or difficulty passing urine -signs of decreased platelets or bleeding - bruising, pinpoint red spots on the skin, black, tarry stools, blood in the urine -signs of decreased red blood cells - unusually weak or tired, fainting spells, lightheadedness -breathing problems -chest pain -fast, irregular heartbeat -mouth sores -nausea, vomiting -pain, swelling, redness at site where injected -pain, tingling, numbness in the hands or feet -swelling of ankles, feet, or hands -unusual bleeding or bruising Side effects that usually do not require medical attention (report to your doctor or health care professional if they continue or are bothersome): -diarrhea -facial flushing -hair loss -  loss of appetite -missed menstrual periods -nail discoloration or damage -red or watery eyes -red colored urine -stomach  upset This list may not describe all possible side effects. Call your doctor for medical advice about side effects. You may report side effects to FDA at 1-800-FDA-1088. Where should I keep my medicine? This drug is given in a hospital or clinic and will not be stored at home. NOTE: This sheet is a summary. It may not cover all possible information. If you have questions about this medicine, talk to your doctor, pharmacist, or health care provider.    2016, [employer] Standard. (2013-03-26 09:54:34)  Cyclophosphamide injection Cytoxan What is this medicine? CYCLOPHOSPHAMIDE (sye kloe FOSS fa mide) is a chemotherapy drug. It slows the growth of cancer cells. This medicine is used to treat many types of cancer like lymphoma, myeloma, leukemia, breast cancer, and ovarian cancer, to name a few. This medicine may be used for other purposes; ask your health care provider or pharmacist if you have questions. What should I tell my health care provider before I take this medicine? They need to know if you have any of these conditions: -blood disorders -history of other chemotherapy -infection -kidney disease -liver disease -recent or ongoing radiation therapy -tumors in the bone marrow -an unusual or allergic reaction to cyclophosphamide, other chemotherapy, other medicines, foods, dyes, or preservatives -pregnant or trying to get pregnant -breast-feeding How should I use this medicine? This drug is usually given as an injection into a vein or muscle or by infusion into a vein. It is administered in a hospital or clinic by a specially trained health care professional. Talk to your pediatrician regarding the use of this medicine in children. Special care may be needed. Overdosage: If you think you have taken too much of this medicine contact a poison control center or emergency room at once. NOTE: This medicine is only for you. Do not share this medicine with others. What if I miss a  dose? It is important not to miss your dose. Call your doctor or health care professional if you are unable to keep an appointment. What may interact with this medicine? This medicine may interact with the following medications: -amiodarone -amphotericin B -azathioprine -certain antiviral medicines for HIV or AIDS such as protease inhibitors (e.g., indinavir, ritonavir) and zidovudine -certain blood pressure medications such as benazepril, captopril, enalapril, fosinopril, lisinopril, moexipril, monopril, perindopril, quinapril, ramipril, trandolapril -certain cancer medications such as anthracyclines (e.g., daunorubicin, doxorubicin), busulfan, cytarabine, paclitaxel, pentostatin, tamoxifen, trastuzumab -certain diuretics such as chlorothiazide, chlorthalidone, hydrochlorothiazide, indapamide, metolazone -certain medicines that treat or prevent blood clots like warfarin -certain muscle relaxants such as succinylcholine -cyclosporine -etanercept -indomethacin -medicines to increase blood counts like filgrastim, pegfilgrastim, sargramostim -medicines used as general anesthesia -metronidazole -natalizumab This list may not describe all possible interactions. Give your health care provider a list of all the medicines, herbs, non-prescription drugs, or dietary supplements you use. Also tell them if you smoke, drink alcohol, or use illegal drugs. Some items may interact with your medicine. What should I watch for while using this medicine? Visit your doctor for checks on your progress. This drug may make you feel generally unwell. This is not uncommon, as chemotherapy can affect healthy cells as well as cancer cells. Report any side effects. Continue your course of treatment even though you feel ill unless your doctor tells you to stop. Drink water or other fluids as directed. Urinate often, even at night. In some cases, you may be given additional  medicines to help with side effects. Follow all  directions for their use. Call your doctor or health care professional for advice if you get a fever, chills or sore throat, or other symptoms of a cold or flu. Do not treat yourself. This drug decreases your body's ability to fight infections. Try to avoid being around people who are sick. This medicine may increase your risk to bruise or bleed. Call your doctor or health care professional if you notice any unusual bleeding. Be careful brushing and flossing your teeth or using a toothpick because you may get an infection or bleed more easily. If you have any dental work done, tell your dentist you are receiving this medicine. You may get drowsy or dizzy. Do not drive, use machinery, or do anything that needs mental alertness until you know how this medicine affects you. Do not become pregnant while taking this medicine or for 1 year after stopping it. Women should inform their doctor if they wish to become pregnant or think they might be pregnant. Men should not father a child while taking this medicine and for 4 months after stopping it. There is a potential for serious side effects to an unborn child. Talk to your health care professional or pharmacist for more information. Do not breast-feed an infant while taking this medicine. This medicine may interfere with the ability to have a child. This medicine has caused ovarian failure in some women. This medicine has caused reduced sperm counts in some men. You should talk with your doctor or health care professional if you are concerned about your fertility. If you are going to have surgery, tell your doctor or health care professional that you have taken this medicine. What side effects may I notice from receiving this medicine? Side effects that you should report to your doctor or health care professional as soon as possible: -allergic reactions like skin rash, itching or hives, swelling of the face, lips, or tongue -low blood counts - this medicine may  decrease the number of white blood cells, red blood cells and platelets. You may be at increased risk for infections and bleeding. -signs of infection - fever or chills, cough, sore throat, pain or difficulty passing urine -signs of decreased platelets or bleeding - bruising, pinpoint red spots on the skin, black, tarry stools, blood in the urine -signs of decreased red blood cells - unusually weak or tired, fainting spells, lightheadedness -breathing problems -dark urine -dizziness -palpitations -swelling of the ankles, feet, hands -trouble passing urine or change in the amount of urine -weight gain -yellowing of the eyes or skin Side effects that usually do not require medical attention (report to your doctor or health care professional if they continue or are bothersome): -changes in nail or skin color -hair loss -missed menstrual periods -mouth sores -nausea, vomiting This list may not describe all possible side effects. Call your doctor for medical advice about side effects. You may report side effects to FDA at 1-800-FDA-1088. Where should I keep my medicine? This drug is given in a hospital or clinic and will not be stored at home. NOTE: This sheet is a summary. It may not cover all possible information. If you have questions about this medicine, talk to your doctor, pharmacist, or health care provider.    2016, [employer] Standard. (2012-10-12 16:22:58)

## 2016-02-11 NOTE — Telephone Encounter (Signed)
Called pt to assess her 1st chemo treatment. Relate did well and denies complaints. Relate she ate a good lunch of hamberger and fries. Encourage pt to call with needs or questions. Received verbal understanding.

## 2016-02-11 NOTE — Progress Notes (Signed)
Met w/ pt regarding copay assistance.  Informed pt the type of ins she has they will pay for her treatment at 100%.  Offered her the [employer] and gave her an expense sheet.  She lives in a group home which has access to her finances so pt gave me permission to called [employer] to request pt's income to see if she qualifies for the grant.  She has my card for any questions or concerns she may have in the future.

## 2016-02-11 NOTE — Telephone Encounter (Signed)
appt made and avs will be printed in treatment room. Made appt 1 day early due to MD schedule being booked 3/8

## 2016-02-12 ENCOUNTER — Telehealth: Payer: Self-pay | Admitting: *Deleted

## 2016-02-12 NOTE — Telephone Encounter (Signed)
1st AC yesterday. Patient eating and drinking well. Denies any problems. Advised to call for any questions or concerns. She verbalized understanding.

## 2016-02-15 NOTE — Assessment & Plan Note (Signed)
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 Adjuvant chemotherapy (Adriamycin and Cytoxan dose dense 4 followed by Abraxane weekly 12) 2. Followed by radiation  -------------------------------------------------------------------------------------------------------------------------- Current Treatment: Cycle 1 day 8 Dose dense Adriamycin and Cytoxan BRCA2: Rec Salpingo oophorectomy ECHO 02/08/16: EF 55% I reviewed her antiemetics regimen in detail. We will be monitoring her closely for toxicities. I did not prescribe dexamethasone because of her diabetes. Did not prescribe Ativan either RTC in 1 week for cycle 2

## 2016-02-16 ENCOUNTER — Telehealth: Payer: Self-pay | Admitting: Hematology and Oncology

## 2016-02-16 ENCOUNTER — Encounter: Payer: Self-pay | Admitting: Hematology and Oncology

## 2016-02-16 ENCOUNTER — Ambulatory Visit (HOSPITAL_BASED_OUTPATIENT_CLINIC_OR_DEPARTMENT_OTHER): Payer: Medicaid Other | Admitting: Hematology and Oncology

## 2016-02-16 ENCOUNTER — Other Ambulatory Visit (HOSPITAL_BASED_OUTPATIENT_CLINIC_OR_DEPARTMENT_OTHER): Payer: Medicaid Other

## 2016-02-16 VITALS — BP 109/62 | HR 95 | Temp 97.9°F | Resp 18 | Ht 60.5 in | Wt 174.5 lb

## 2016-02-16 DIAGNOSIS — C50411 Malignant neoplasm of upper-outer quadrant of right female breast: Secondary | ICD-10-CM

## 2016-02-16 DIAGNOSIS — C773 Secondary and unspecified malignant neoplasm of axilla and upper limb lymph nodes: Secondary | ICD-10-CM

## 2016-02-16 DIAGNOSIS — Z171 Estrogen receptor negative status [ER-]: Secondary | ICD-10-CM

## 2016-02-16 LAB — COMPREHENSIVE METABOLIC PANEL
ALT: 16 U/L (ref 0–55)
AST: 14 U/L (ref 5–34)
Albumin: 3.6 g/dL (ref 3.5–5.0)
Alkaline Phosphatase: 74 U/L (ref 40–150)
Anion Gap: 11 mEq/L (ref 3–11)
BUN: 12.3 mg/dL (ref 7.0–26.0)
CO2: 26 mEq/L (ref 22–29)
Calcium: 9.4 mg/dL (ref 8.4–10.4)
Chloride: 103 mEq/L (ref 98–109)
Creatinine: 0.8 mg/dL (ref 0.6–1.1)
EGFR: >90 mL/min/{1.73_m2} (ref >90)
Glucose: 129 mg/dl (ref 70–140)
Potassium: 4.2 mEq/L (ref 3.5–5.1)
Sodium: 140 mEq/L (ref 136–145)
Total Bilirubin: 0.41 mg/dL (ref 0.20–1.20)
Total Protein: 7 g/dL (ref 6.4–8.3)

## 2016-02-16 LAB — CBC WITH DIFFERENTIAL/PLATELET
BASO%: 0.2 % (ref 0.0–2.0)
Basophils Absolute: 0 10*3/uL (ref 0.0–0.1)
EOS%: 4.4 % (ref 0.0–7.0)
Eosinophils Absolute: 0.2 10*3/uL (ref 0.0–0.5)
HCT: 34.4 % — ABNORMAL LOW (ref 34.8–46.6)
HGB: 11.7 g/dL (ref 11.6–15.9)
LYMPH%: 41.5 % (ref 14.0–49.7)
MCH: 28.7 pg (ref 25.1–34.0)
MCHC: 33.9 g/dL (ref 31.5–36.0)
MCV: 84.6 fL (ref 79.5–101.0)
MONO#: 0 10*3/uL — ABNORMAL LOW (ref 0.1–0.9)
MONO%: 0.8 % (ref 0.0–14.0)
NEUT#: 2.1 10*3/uL (ref 1.5–6.5)
NEUT%: 53.1 % (ref 38.4–76.8)
Platelets: 254 10*3/uL (ref 145–400)
RBC: 4.07 10*6/uL (ref 3.70–5.45)
RDW: 13.1 % (ref 11.2–14.5)
WBC: 4 10*3/uL (ref 3.9–10.3)
lymph#: 1.7 10*3/uL (ref 0.9–3.3)

## 2016-02-16 NOTE — Telephone Encounter (Signed)
appt made and avs printed °

## 2016-02-16 NOTE — Progress Notes (Signed)
Patient Care Team: Iona Beard, MD as PCP - General (Family Medicine)  SUMMARY OF ONCOLOGIC HISTORY:   Breast cancer of upper-outer quadrant of right female breast ([place])   11/25/2015 Mammogram Right breast mass 12 cm from the nipple: 1.4 x 1.3 x 0.9 cm, two right axillary lymph nodes with borderline cortical thickening measuring up to 3 mm   12/04/2015 Initial Diagnosis Right breast biopsy 10:00: Invasive ductal carcinoma with DCIS, grade 2, ER 0%, PR 0%, HER-2 negative, ratio1.31, Ki-67 40%   01/13/2016 Procedure 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   01/20/2016 Surgery Rt Lumpectomy: IDC 1.7 cm, grade 3, with DCIS, 0/2 LN, ER 0%, PR 0%, Her 2 Neg Ratio 1.35, Ki 67: 40%, T1CN0 (stage 1A)   02/11/2016 -  Chemotherapy Adjuvant chemotherapy with dose dense Adriamycin and Cytoxan 4 followed by Abraxane weekly 12   CHIEF COMPLIANT: Cycle 1 day before meals dose dense Adriamycin and Cytoxan  INTERVAL HISTORY: Alison [name] is a 55 year old with above-mentioned history of right breast cancer currently on adjuvant chemotherapy. Today is cycle 1 day 8 of dose dense Adriamycin and Cytoxan. She appeared to have tolerated cycle one extremely well. She denies any nausea or vomiting. She has excellent appetite. She felt mildly fatigued but without any major limitation of activity. Did not have any bowel disturbances.  REVIEW OF SYSTEMS:   Constitutional: Denies fevers, chills or abnormal weight loss Eyes: Denies blurriness of vision Ears, nose, mouth, throat, and face: Denies mucositis or sore throat Respiratory: Denies cough, dyspnea or wheezes Cardiovascular: Denies palpitation, chest discomfort Gastrointestinal:  Denies nausea, heartburn or change in bowel habits Skin: Denies abnormal skin rashes Lymphatics: Denies new lymphadenopathy or easy  bruising Neurological:Denies numbness, tingling or new weaknesses Behavioral/Psych: Mood is stable, no new changes  Extremities: No lower extremity edema Breast:  denies any pain or lumps or nodules in either breasts All other systems were reviewed with the patient and are negative.  I have reviewed the past medical history, past surgical history, social history and family history with the patient and they are unchanged from previous note.  ALLERGIES:  has No Known Allergies.  MEDICATIONS:  Current Outpatient Prescriptions  Medication Sig Dispense Refill  . acetaminophen (TYLENOL) 325 MG tablet Take 650 mg by mouth every 4 (four) hours as needed for mild pain.     . benztropine (COGENTIN) 0.5 MG tablet Take 0.5 mg by mouth 2 (two) times daily.    . divalproex (DEPAKOTE ER) 500 MG 24 hr tablet Take 1,000 mg by mouth at bedtime.    . furosemide (LASIX) 40 MG tablet Take 40 mg by mouth every morning.    . lidocaine-prilocaine (EMLA) cream Apply to affected area once 30 g 3  . lisinopril (PRINIVIL,ZESTRIL) 10 MG tablet Take 10 mg by mouth every morning.     . metFORMIN (GLUCOPHAGE) 500 MG tablet Take by mouth 2 (two) times daily with a meal.    . OLANZapine (ZYPREXA) 10 MG tablet Take 10 mg by mouth at bedtime.    Marland Kitchen omeprazole (PRILOSEC) 20 MG capsule Take 40 mg by mouth every morning.    . ondansetron (ZOFRAN) 8 MG tablet Take 1 tablet (8 mg total) by mouth 2 (two) times daily as needed. Start on the third day after chemotherapy. 30 tablet 1  . oxyCODONE-acetaminophen (ROXICET) 5-325 MG tablet Take 1-2 tablets by mouth every 4 (four) hours  as needed for moderate pain or severe pain. 30 tablet 0  . Polyethyl Glycol-Propyl Glycol (SYSTANE) 0.4-0.3 % SOLN Apply 1 drop to eye 4 (four) times daily.    . prochlorperazine (COMPAZINE) 10 MG tablet Take 1 tablet (10 mg total) by mouth every 6 (six) hours as needed (Nausea or vomiting). 30 tablet 1  . simvastatin (ZOCOR) 10 MG tablet Take 10 mg by  mouth at bedtime.     No current facility-administered medications for this visit.    PHYSICAL EXAMINATION: ECOG PERFORMANCE STATUS: 0 - Asymptomatic  Filed Vitals:   02/16/16 0853  BP: 109/62  Pulse: 95  Temp: 97.9 F (36.6 C)  Resp: 18   Filed Weights   02/16/16 0853  Weight: 174 lb 8 oz (79.153 kg)    GENERAL:alert, no distress and comfortable SKIN: skin color, texture, turgor are normal, no rashes or significant lesions EYES: normal, Conjunctiva are pink and non-injected, sclera clear OROPHARYNX:no exudate, no erythema and lips, buccal mucosa, and tongue normal  NECK: supple, thyroid normal size, non-tender, without nodularity LYMPH:  no palpable lymphadenopathy in the cervical, axillary or inguinal LUNGS: clear to auscultation and percussion with normal breathing effort HEART: regular rate & rhythm and no murmurs and no lower extremity edema ABDOMEN:abdomen soft, non-tender and normal bowel sounds MUSCULOSKELETAL:no cyanosis of digits and no clubbing  NEURO: alert & oriented x 3 with fluent speech, no focal motor/sensory deficits EXTREMITIES: No lower extremity edema   LABORATORY DATA:  I have reviewed the data as listed   Chemistry      Component Value Date/Time   NA 141 02/11/2016 0926   NA 145 01/14/2016 1420   K 3.9 02/11/2016 0926   K 4.3 01/14/2016 1420   CL 102 01/14/2016 1420   CO2 23 02/11/2016 0926   CO2 28 01/14/2016 1420   BUN 7.1 02/11/2016 0926   BUN 7 01/14/2016 1420   CREATININE 0.9 02/11/2016 0926   CREATININE 0.77 01/14/2016 1420      Component Value Date/Time   CALCIUM 9.5 02/11/2016 0926   CALCIUM 9.2 01/14/2016 1420   ALKPHOS 83 02/11/2016 0926   ALKPHOS 76 04/17/2014 0345   AST 23 02/11/2016 0926   AST 59* 04/17/2014 0345   ALT 22 02/11/2016 0926   ALT 66* 04/17/2014 0345   BILITOT <0.30 Repeated and Verified 02/11/2016 0926   BILITOT <0.2* 04/17/2014 0345       Lab Results  Component Value Date   WBC 4.0 02/16/2016    HGB 11.7 02/16/2016   HCT 34.4* 02/16/2016   MCV 84.6 02/16/2016   PLT 254 02/16/2016   NEUTROABS 2.1 02/16/2016   ASSESSMENT & PLAN:  Breast cancer of upper-outer quadrant of right female breast ([place]) Rt Lumpectomy 01/20/16: 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 Adjuvant chemotherapy (Adriamycin and Cytoxan dose dense 4 followed by Abraxane weekly 12) 2. Followed by radiation  -------------------------------------------------------------------------------------------------------------------------- Current Treatment: Cycle 1 day 8 Dose dense Adriamycin and Cytoxan BRCA2: Rec Salpingo oophorectomy ECHO 02/08/16: EF 55% I reviewed her antiemetics regimen in detail. We  will be monitoring her closely for toxicities. I did not prescribe dexamethasone because of her diabetes. Did not prescribe Ativan either RTC in 1 week for cycle 2    No orders of the defined types were placed in this encounter.   The patient has a good understanding of the overall plan. she agrees with it. she will call with any problems that may develop before the next visit here.   Rulon Eisenmenger, MD 02/16/2016

## 2016-02-24 NOTE — Assessment & Plan Note (Signed)
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 Adjuvant chemotherapy (Adriamycin and Cytoxan dose dense 4 followed by Abraxane weekly 12) 2. Followed by radiation  -------------------------------------------------------------------------------------------------------------------------- Current Treatment: Cycle 2 day 1 Dose dense Adriamycin and Cytoxan BRCA2: Rec Salpingo oophorectomy ECHO 02/08/16: EF 55% I reviewed her antiemetics regimen in detail. We will be monitoring her closely for toxicities. I did not prescribe dexamethasone because of her diabetes. Did not prescribe Ativan either RTC in 2 week for cycle 3

## 2016-02-25 ENCOUNTER — Other Ambulatory Visit (HOSPITAL_BASED_OUTPATIENT_CLINIC_OR_DEPARTMENT_OTHER): Payer: Medicaid Other

## 2016-02-25 ENCOUNTER — Other Ambulatory Visit: Payer: Self-pay

## 2016-02-25 ENCOUNTER — Ambulatory Visit (HOSPITAL_BASED_OUTPATIENT_CLINIC_OR_DEPARTMENT_OTHER): Payer: Medicaid Other

## 2016-02-25 ENCOUNTER — Telehealth: Payer: Self-pay | Admitting: *Deleted

## 2016-02-25 ENCOUNTER — Encounter: Payer: Self-pay | Admitting: *Deleted

## 2016-02-25 ENCOUNTER — Encounter: Payer: Self-pay | Admitting: Hematology and Oncology

## 2016-02-25 ENCOUNTER — Ambulatory Visit (HOSPITAL_BASED_OUTPATIENT_CLINIC_OR_DEPARTMENT_OTHER): Payer: Medicaid Other | Admitting: Hematology and Oncology

## 2016-02-25 ENCOUNTER — Ambulatory Visit: Payer: Medicaid Other

## 2016-02-25 VITALS — BP 115/74 | HR 99 | Temp 98.1°F | Resp 20 | Ht 60.5 in | Wt 178.1 lb

## 2016-02-25 DIAGNOSIS — D649 Anemia, unspecified: Secondary | ICD-10-CM

## 2016-02-25 DIAGNOSIS — C773 Secondary and unspecified malignant neoplasm of axilla and upper limb lymph nodes: Secondary | ICD-10-CM

## 2016-02-25 DIAGNOSIS — C50411 Malignant neoplasm of upper-outer quadrant of right female breast: Secondary | ICD-10-CM

## 2016-02-25 DIAGNOSIS — Z171 Estrogen receptor negative status [ER-]: Secondary | ICD-10-CM

## 2016-02-25 DIAGNOSIS — D701 Agranulocytosis secondary to cancer chemotherapy: Secondary | ICD-10-CM

## 2016-02-25 LAB — CBC WITH DIFFERENTIAL/PLATELET
BASO%: 0.5 % (ref 0.0–2.0)
Basophils Absolute: 0 10*3/uL (ref 0.0–0.1)
EOS%: 2.6 % (ref 0.0–7.0)
Eosinophils Absolute: 0.1 10*3/uL (ref 0.0–0.5)
HCT: 30.7 % — ABNORMAL LOW (ref 34.8–46.6)
HGB: 10.7 g/dL — ABNORMAL LOW (ref 11.6–15.9)
LYMPH%: 78.9 % — ABNORMAL HIGH (ref 14.0–49.7)
MCH: 28.9 pg (ref 25.1–34.0)
MCHC: 34.9 g/dL (ref 31.5–36.0)
MCV: 83 fL (ref 79.5–101.0)
MONO#: 0.3 10*3/uL (ref 0.1–0.9)
MONO%: 13.4 % (ref 0.0–14.0)
NEUT#: 0.1 10*3/uL — CL (ref 1.5–6.5)
NEUT%: 4.6 % — ABNORMAL LOW (ref 38.4–76.8)
Platelets: 195 10*3/uL (ref 145–400)
RBC: 3.7 10*6/uL (ref 3.70–5.45)
RDW: 12.2 % (ref 11.2–14.5)
WBC: 1.9 10*3/uL — ABNORMAL LOW (ref 3.9–10.3)
lymph#: 1.5 10*3/uL (ref 0.9–3.3)

## 2016-02-25 LAB — COMPREHENSIVE METABOLIC PANEL
ALT: 17 U/L (ref 0–55)
AST: 14 U/L (ref 5–34)
Albumin: 3.3 g/dL — ABNORMAL LOW (ref 3.5–5.0)
Alkaline Phosphatase: 75 U/L (ref 40–150)
Anion Gap: 12 mEq/L — ABNORMAL HIGH (ref 3–11)
BUN: 6.6 mg/dL — ABNORMAL LOW (ref 7.0–26.0)
CO2: 24 mEq/L (ref 22–29)
Calcium: 8.6 mg/dL (ref 8.4–10.4)
Chloride: 102 mEq/L (ref 98–109)
Creatinine: 0.8 mg/dL (ref 0.6–1.1)
EGFR: >90 mL/min/{1.73_m2} (ref >90)
Glucose: 192 mg/dl — ABNORMAL HIGH (ref 70–140)
Potassium: 3.8 mEq/L (ref 3.5–5.1)
Sodium: 138 mEq/L (ref 136–145)
Total Bilirubin: <0.3 mg/dL (ref 0.20–1.20)
Total Protein: 6.9 g/dL (ref 6.4–8.3)

## 2016-02-25 MED ORDER — TBO-FILGRASTIM 480 MCG/0.8ML ~~LOC~~ SOSY
480.0000 ug | PREFILLED_SYRINGE | Freq: Once | SUBCUTANEOUS | Status: AC
  Administered 2016-02-25: 480 ug via SUBCUTANEOUS
  Filled 2016-02-25: qty 0.8

## 2016-02-25 MED ORDER — FILGRASTIM 480 MCG/0.8ML IJ SOSY: 480.0000 ug | PREFILLED_SYRINGE | Freq: Once | INTRAMUSCULAR | Status: DC

## 2016-02-25 MED ORDER — FILGRASTIM 480 MCG/0.8ML IJ SOSY
480.0000 ug | PREFILLED_SYRINGE | Freq: Once | INTRAMUSCULAR | Status: DC
  Filled 2016-02-25: qty 0.8

## 2016-02-25 NOTE — Telephone Encounter (Signed)
Per staff message and POF I have scheduled appts. Advised scheduler of appts. JMW  

## 2016-02-25 NOTE — Progress Notes (Signed)
Patient has critical lab today- ANC 0.1 Patient not getting chemotherapy per Dr. Lindi Adie Patient to see Thayer Headings for a Neupogen Injection

## 2016-02-25 NOTE — Progress Notes (Signed)
Patient Care Team: Iona Beard, MD as PCP - General (Family Medicine)  SUMMARY OF ONCOLOGIC HISTORY:   Breast cancer of upper-outer quadrant of right female breast ([place])   11/25/2015 Mammogram Right breast mass 12 cm from the nipple: 1.4 x 1.3 x 0.9 cm, two right axillary lymph nodes with borderline cortical thickening measuring up to 3 mm   12/04/2015 Initial Diagnosis Right breast biopsy 10:00: Invasive ductal carcinoma with DCIS, grade 2, ER 0%, PR 0%, HER-2 negative, ratio1.31, Ki-67 40%   01/13/2016 Procedure 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   01/20/2016 Surgery Rt Lumpectomy: IDC 1.7 cm, grade 3, with DCIS, 0/2 LN, ER 0%, PR 0%, Her 2 Neg Ratio 1.35, Ki 67: 40%, T1CN0 (stage 1A)   02/11/2016 -  Chemotherapy Adjuvant chemotherapy with dose dense Adriamycin and Cytoxan 4 followed by Abraxane weekly 12    CHIEF COMPLIANT: Cycle 2 day 1 adjuvant dose dense Adriamycin and Cytoxan  INTERVAL HISTORY: Alison [name] is a 55 year old with above-mentioned history of right breast cancer currently on adjuvant chemotherapy with dose dense Adriamycin and Cytoxan. Today is cycle 2 day 1 but it will be postponed until next week because of low ANC. She reports no clinical signs and symptoms of chemotherapy-related toxicities.  REVIEW OF SYSTEMS:   Constitutional: Denies fevers, chills or abnormal weight loss Eyes: Denies blurriness of vision Ears, nose, mouth, throat, and face: Denies mucositis or sore throat Respiratory: Denies cough, dyspnea or wheezes Cardiovascular: Denies palpitation, chest discomfort Gastrointestinal:  Denies nausea, heartburn or change in bowel habits Skin: Denies abnormal skin rashes Lymphatics: Denies new lymphadenopathy or easy bruising Neurological:Denies numbness, tingling or new weaknesses Behavioral/Psych: Mood is  stable, no new changes  Extremities: No lower extremity edema  All other systems were reviewed with the patient and are negative.  I have reviewed the past medical history, past surgical history, social history and family history with the patient and they are unchanged from previous note.  ALLERGIES:  has No Known Allergies.  MEDICATIONS:  Current Outpatient Prescriptions  Medication Sig Dispense Refill  . acetaminophen (TYLENOL) 325 MG tablet Take 650 mg by mouth every 4 (four) hours as needed for mild pain.     . benztropine (COGENTIN) 0.5 MG tablet Take 0.5 mg by mouth 2 (two) times daily.    . divalproex (DEPAKOTE ER) 500 MG 24 hr tablet Take 1,000 mg by mouth at bedtime.    . furosemide (LASIX) 40 MG tablet Take 40 mg by mouth every morning.    . lidocaine-prilocaine (EMLA) cream Apply to affected area once 30 g 3  . lisinopril (PRINIVIL,ZESTRIL) 10 MG tablet Take 10 mg by mouth every morning.     . metFORMIN (GLUCOPHAGE) 500 MG tablet Take by mouth 2 (two) times daily with a meal.    . OLANZapine (ZYPREXA) 10 MG tablet Take 10 mg by mouth at bedtime.    Marland Kitchen omeprazole (PRILOSEC) 20 MG capsule Take 40 mg by mouth every morning.    . ondansetron (ZOFRAN) 8 MG tablet Take 1 tablet (8 mg total) by mouth 2 (two) times daily as needed. Start on the third day after chemotherapy. 30 tablet 1  . oxyCODONE-acetaminophen (ROXICET) 5-325 MG tablet Take 1-2 tablets by mouth every 4 (four) hours as needed for moderate pain or severe pain. 30 tablet 0  . Polyethyl Glycol-Propyl Glycol (SYSTANE) 0.4-0.3 % SOLN Apply 1 drop to  eye 4 (four) times daily.    . prochlorperazine (COMPAZINE) 10 MG tablet Take 1 tablet (10 mg total) by mouth every 6 (six) hours as needed (Nausea or vomiting). 30 tablet 1  . simvastatin (ZOCOR) 10 MG tablet Take 10 mg by mouth at bedtime.     No current facility-administered medications for this visit.    PHYSICAL EXAMINATION: ECOG PERFORMANCE STATUS: 1 - Symptomatic but  completely ambulatory  Filed Vitals:   02/25/16 0814  BP: 115/74  Pulse: 99  Temp: 98.1 F (36.7 C)  Resp: 20   Filed Weights   02/25/16 0814  Weight: 178 lb 1.6 oz (80.786 kg)    GENERAL:alert, no distress and comfortable SKIN: skin color, texture, turgor are normal, no rashes or significant lesions EYES: normal, Conjunctiva are pink and non-injected, sclera clear OROPHARYNX:no exudate, no erythema and lips, buccal mucosa, and tongue normal  NECK: supple, thyroid normal size, non-tender, without nodularity LYMPH:  no palpable lymphadenopathy in the cervical, axillary or inguinal LUNGS: clear to auscultation and percussion with normal breathing effort HEART: regular rate & rhythm and no murmurs and no lower extremity edema ABDOMEN:abdomen soft, non-tender and normal bowel sounds MUSCULOSKELETAL:no cyanosis of digits and no clubbing  NEURO: alert & oriented x 3 with fluent speech, no focal motor/sensory deficits EXTREMITIES: No lower extremity edema  LABORATORY DATA:  I have reviewed the data as listed   Chemistry      Component Value Date/Time   NA 140 02/16/2016 0839   NA 145 01/14/2016 1420   K 4.2 02/16/2016 0839   K 4.3 01/14/2016 1420   CL 102 01/14/2016 1420   CO2 26 02/16/2016 0839   CO2 28 01/14/2016 1420   BUN 12.3 02/16/2016 0839   BUN 7 01/14/2016 1420   CREATININE 0.8 02/16/2016 0839   CREATININE 0.77 01/14/2016 1420      Component Value Date/Time   CALCIUM 9.4 02/16/2016 0839   CALCIUM 9.2 01/14/2016 1420   ALKPHOS 74 02/16/2016 0839   ALKPHOS 76 04/17/2014 0345   AST 14 02/16/2016 0839   AST 59* 04/17/2014 0345   ALT 16 02/16/2016 0839   ALT 66* 04/17/2014 0345   BILITOT 0.41 02/16/2016 0839   BILITOT <0.2* 04/17/2014 0345      Lab Results  Component Value Date   WBC 4.0 02/16/2016   HGB 11.7 02/16/2016   HCT 34.4* 02/16/2016   MCV 84.6 02/16/2016   PLT 254 02/16/2016   NEUTROABS 2.1 02/16/2016   ASSESSMENT & PLAN:  Breast cancer of  upper-outer quadrant of right female breast ([place]) Rt Lumpectomy 01/20/16: 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 Adjuvant chemotherapy (Adriamycin and Cytoxan dose dense 4 followed by Abraxane weekly 12) 2. Followed by radiation  -------------------------------------------------------------------------------------------------------------------------- Current Treatment: Cycle 2 day 1 Dose dense Adriamycin and Cytoxan (to be postponed until next week) BRCA2: Rec Salpingo oophorectomy ECHO 02/08/16: EF 55% I provided her with neutropenic precautions. We will give her a dose of Neupogen. I'm concerned that the Neulasta injection did not work or it was an approximately  remote before the injection could deliver the drug. For the next cycle he might consider just bringing her back in for the Neulasta injection.  We are monitoring her closely for toxicities. I did not prescribe dexamethasone because of her diabetes. Did not prescribe Ativan either RTC in 2 week for cycle 3  No orders of the defined types were placed in this encounter.   The patient has a good understanding of the overall plan. she agrees with it. she will call with any problems that may develop before the next visit here.   Rulon Eisenmenger, MD 02/25/2016

## 2016-02-25 NOTE — Patient Instructions (Signed)
Neutropenia Neutropenia is a condition that occurs when the level of a certain type of white blood cell (neutrophil) in your body becomes lower than normal. Neutrophils are made in the bone marrow and fight infections. These cells protect against bacteria and viruses. The fewer neutrophils you have, and the longer your body remains without them, the greater your risk of getting a severe infection becomes. CAUSES  The cause of neutropenia may be hard to determine. However, it is usually due to 3 main problems:   Decreased production of neutrophils. This may be due to:  Certain medicines such as chemotherapy.  Genetic problems.  Cancer.  Radiation treatments.  Vitamin deficiency.  Some pesticides.  Increased destruction of neutrophils. This may be due to:  Overwhelming infections.  Hemolytic anemia. This is when the body destroys its own blood cells.  Chemotherapy.  Neutrophils moving to areas of the body where they cannot fight infections. This may be due to:  Dialysis procedures.  Conditions where the spleen becomes enlarged. Neutrophils are held in the spleen and are not available to the rest of the body.  Overwhelming infections. The neutrophils are held in the area of the infection and are not available to the rest of the body. SYMPTOMS  There are no specific symptoms of neutropenia. The lack of neutrophils can result in an infection, and an infection can cause various problems. DIAGNOSIS  Diagnosis is made by a blood test. A complete blood count is performed. The normal level of neutrophils in human blood differs with age and race. Infants have lower counts than older children and adults. African Americans have lower counts than Caucasians or Asians. The average adult level is 1500 cells/mm3 of blood. Neutrophil counts are interpreted as follows:  Greater than 1000 cells/mm3 gives normal protection against infection.  500 to 1000 cells/mm3 gives an increased risk for  infection.  200 to 500 cells/mm3 is a greater risk for severe infection.  Lower than 200 cells/mm3 is a marked risk of infection. This may require hospitalization and treatment with antibiotic medicines. TREATMENT  Treatment depends on the underlying cause, severity, and presence of infections or symptoms. It also depends on your health. Your caregiver will discuss the treatment plan with you. Mild cases are often easily treated and have a good outcome. Preventative measures may also be started to limit your risk of infections. Treatment can include:  Taking antibiotics.  Stopping medicines that are known to cause neutropenia.  Correcting nutritional deficiencies by eating green vegetables to supply folic acid and taking vitamin B supplements.  Stopping exposure to pesticides if your neutropenia is related to pesticide exposure.  Taking a blood growth factor called sargramostim, pegfilgrastim, or filgrastim if you are undergoing chemotherapy for cancer. This stimulates white blood cell production.  Removal of the spleen if you have Felty's syndrome and have repeated infections. HOME CARE INSTRUCTIONS   Follow your caregiver's instructions about when you need to have blood work done.  Wash your hands often. Make sure others who come in contact with you also wash their hands.  Wash raw fruits and vegetables before eating them. They can carry bacteria and fungi.  Avoid people with colds or spreadable (contagious) diseases (chickenpox, herpes zoster, influenza).  Avoid large crowds.  Avoid construction areas. The dust can release fungus into the air.  Be cautious around children in daycare or school environments.  Take care of your respiratory system by coughing and deep breathing.  Bathe daily.  Protect your skin from cuts and   burns.  Do not work in the garden or with flowers and plants.  Care for the mouth before and after meals by brushing with a soft toothbrush. If you have  mucositis, do not use mouthwash. Mouthwash contains alcohol and can dry out the mouth even more.  Clean the area between the genitals and the anus (perineal area) after urination and bowel movements. Women need to wipe from front to back.  Use a water soluble lubricant during sexual intercourse and practice good hygiene after. Do not have intercourse if you are severely neutropenic. Check with your caregiver for guidelines.  Exercise daily as tolerated.  Avoid people who were vaccinated with a live vaccine in the past 30 days. You should not receive live vaccines (polio, typhoid).  Do not provide direct care for pets. Avoid animal droppings. Do not clean litter boxes and bird cages.  Do not share food utensils.  Do not use tampons, enemas, or rectal suppositories unless directed by your caregiver.  Use an electric razor to remove hair.  Wash your hands after handling magazines, letters, and newspapers. SEEK IMMEDIATE MEDICAL CARE IF:   You have a fever.  You have chills or start to shake.  You feel nauseous or vomit.  You develop mouth sores.  You develop aches and pains.  You have redness and swelling around open wounds.  Your skin is warm to the touch.  You have pus coming from your wounds.  You develop swollen lymph nodes.  You feel weak or fatigued.  You develop red streaks on the skin. MAKE SURE YOU:  Understand these instructions.  Will watch your condition.  Will get help right away if you are not doing well or get worse.   This information is not intended to replace advice given to you by your health care provider. Make sure you discuss any questions you have with your health care provider.   Document Released: 05/20/2002 Document Revised: 02/20/2012 Document Reviewed: 06/10/2015 Elsevier Interactive Patient Education 2016 [employer]vier [employer] Tbo-Filgrastim injection What is this medicine? TBO-FILGRASTIM (T B O fil GRA stim) is a granulocyte  colony-stimulating factor that stimulates the growth of neutrophils, a type of white blood cell important in the body's fight against infection. It is used to reduce the incidence of fever and infection in patients with certain types of cancer who are receiving chemotherapy that affects the bone marrow. This medicine may be used for other purposes; ask your health care provider or pharmacist if you have questions. What should I tell my health care provider before I take this medicine? They need to know if you have any of these conditions: -ongoing radiation therapy -sickle cell anemia -an unusual or allergic reaction to tbo-filgrastim, filgrastim, pegfilgrastim, other medicines, foods, dyes, or preservatives -pregnant or trying to get pregnant -breast-feeding How should I use this medicine? This medicine is for injection under the skin. If you get this medicine at home, you will be taught how to prepare and give this medicine. Refer to the Instructions for Use that come with your medication packaging. Use exactly as directed. Take your medicine at regular intervals. Do not take your medicine more often than directed. It is important that you put your used needles and syringes in a special sharps container. Do not put them in a trash can. If you do not have a sharps container, call your pharmacist or healthcare provider to get one. Talk to your pediatrician regarding the use of this medicine in children. Special care may be   needed. Overdosage: If you think you have taken too much of this medicine contact a poison control center or emergency room at once. NOTE: This medicine is only for you. Do not share this medicine with others. What if I miss a dose? It is important not to miss your dose. Call your doctor or health care professional if you miss a dose. What may interact with this medicine? This medicine may interact with the following medications: -medicines that may cause a release of  neutrophils, such as lithium This list may not describe all possible interactions. Give your health care provider a list of all the medicines, herbs, non-prescription drugs, or dietary supplements you use. Also tell them if you smoke, drink alcohol, or use illegal drugs. Some items may interact with your medicine. What should I watch for while using this medicine? You may need blood work done while you are taking this medicine. What side effects may I notice from receiving this medicine? Side effects that you should report to your doctor or health care professional as soon as possible: -allergic reactions like skin rash, itching or hives, swelling of the face, lips, or tongue -shortness of breath or breathing problems -fever -pain, redness, or irritation at site where injected -pinpoint red spots on the skin -stomach or side pain, or pain at the shoulder -swelling -tiredness -trouble passing urine Side effects that usually do not require medical attention (Report these to your doctor or health care professional if they continue or are bothersome.): -bone pain -muscle pain This list may not describe all possible side effects. Call your doctor for medical advice about side effects. You may report side effects to FDA at 1-800-FDA-1088. Where should I keep my medicine? Keep out of the reach of children. Store in a refrigerator between 2 and 8 degrees C (36 and 46 degrees F). Keep in carton to protect from light. Throw away this medicine if it is left out of the refrigerator for more than 5 consecutive days. Throw away any unused medicine after the expiration date. NOTE: This sheet is a summary. It may not cover all possible information. If you have questions about this medicine, talk to your doctor, pharmacist, or health care provider.    2016, Elsevier/Gold Standard. (2014-03-20 11:52:29)  

## 2016-02-29 ENCOUNTER — Encounter: Payer: Self-pay | Admitting: Genetic Counselor

## 2016-03-02 ENCOUNTER — Telehealth: Payer: Self-pay | Admitting: Genetic Counselor

## 2016-03-02 NOTE — Assessment & Plan Note (Signed)
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 Adjuvant chemotherapy (Adriamycin and Cytoxan dose dense 4 followed by Abraxane weekly 12) 2. Followed by radiation  -------------------------------------------------------------------------------------------------------------------------- Current Treatment: Cycle 3 day 1 Dose dense Adriamycin and Cytoxan (to be postponed until next week) BRCA2: Rec Salpingo oophorectomy ECHO 02/08/16: EF 55% I provided her with neutropenic precautions. We will give her a dose of Neupogen. I'm concerned that the Neulasta injection did not work or it was an approximately remote before the injection could deliver the drug. For the next cycle he might consider just bringing her back in for the Neulasta injection.  We are monitoring her closely for toxicities. I did not prescribe dexamethasone because of her diabetes. Did not prescribe Ativan either RTC in 2 week for cycle 4

## 2016-03-03 ENCOUNTER — Telehealth: Payer: Self-pay | Admitting: Hematology and Oncology

## 2016-03-03 ENCOUNTER — Encounter: Payer: Self-pay | Admitting: Hematology and Oncology

## 2016-03-03 ENCOUNTER — Ambulatory Visit (HOSPITAL_BASED_OUTPATIENT_CLINIC_OR_DEPARTMENT_OTHER): Payer: Medicaid Other

## 2016-03-03 ENCOUNTER — Other Ambulatory Visit (HOSPITAL_BASED_OUTPATIENT_CLINIC_OR_DEPARTMENT_OTHER): Payer: Medicaid Other

## 2016-03-03 ENCOUNTER — Encounter: Payer: Self-pay | Admitting: *Deleted

## 2016-03-03 ENCOUNTER — Ambulatory Visit (HOSPITAL_BASED_OUTPATIENT_CLINIC_OR_DEPARTMENT_OTHER): Payer: Medicaid Other | Admitting: Hematology and Oncology

## 2016-03-03 VITALS — BP 117/73 | HR 97 | Temp 98.0°F | Resp 19 | Wt 179.1 lb

## 2016-03-03 DIAGNOSIS — C50411 Malignant neoplasm of upper-outer quadrant of right female breast: Secondary | ICD-10-CM

## 2016-03-03 DIAGNOSIS — Z5111 Encounter for antineoplastic chemotherapy: Secondary | ICD-10-CM

## 2016-03-03 DIAGNOSIS — Z1509 Genetic susceptibility to other malignant neoplasm: Secondary | ICD-10-CM

## 2016-03-03 DIAGNOSIS — Z1501 Genetic susceptibility to malignant neoplasm of breast: Secondary | ICD-10-CM

## 2016-03-03 LAB — CBC WITH DIFFERENTIAL/PLATELET
BASO%: 0.9 % (ref 0.0–2.0)
Basophils Absolute: 0.1 10*3/uL (ref 0.0–0.1)
EOS%: 0.3 % (ref 0.0–7.0)
Eosinophils Absolute: 0 10*3/uL (ref 0.0–0.5)
HCT: 31.3 % — ABNORMAL LOW (ref 34.8–46.6)
HGB: 10.3 g/dL — ABNORMAL LOW (ref 11.6–15.9)
LYMPH%: 27.4 % (ref 14.0–49.7)
MCH: 28.3 pg (ref 25.1–34.0)
MCHC: 32.9 g/dL (ref 31.5–36.0)
MCV: 85.9 fL (ref 79.5–101.0)
MONO#: 1 10*3/uL — ABNORMAL HIGH (ref 0.1–0.9)
MONO%: 14.7 % — ABNORMAL HIGH (ref 0.0–14.0)
NEUT#: 4.1 10*3/uL (ref 1.5–6.5)
NEUT%: 56.7 % (ref 38.4–76.8)
Platelets: 390 10*3/uL (ref 145–400)
RBC: 3.64 10*6/uL — ABNORMAL LOW (ref 3.70–5.45)
RDW: 13.2 % (ref 11.2–14.5)
WBC: 7.1 10*3/uL (ref 3.9–10.3)
lymph#: 2 10*3/uL (ref 0.9–3.3)

## 2016-03-03 LAB — COMPREHENSIVE METABOLIC PANEL
ALT: 11 U/L (ref 0–55)
AST: 13 U/L (ref 5–34)
Albumin: 3.4 g/dL — ABNORMAL LOW (ref 3.5–5.0)
Alkaline Phosphatase: 76 U/L (ref 40–150)
Anion Gap: 12 mEq/L — ABNORMAL HIGH (ref 3–11)
BUN: 9 mg/dL (ref 7.0–26.0)
CO2: 25 mEq/L (ref 22–29)
Calcium: 8.6 mg/dL (ref 8.4–10.4)
Chloride: 106 mEq/L (ref 98–109)
Creatinine: 0.8 mg/dL (ref 0.6–1.1)
EGFR: >90 mL/min/{1.73_m2} (ref >90)
Glucose: 150 mg/dl — ABNORMAL HIGH (ref 70–140)
Potassium: 4 mEq/L (ref 3.5–5.1)
Sodium: 142 mEq/L (ref 136–145)
Total Bilirubin: <0.3 mg/dL (ref 0.20–1.20)
Total Protein: 6.8 g/dL (ref 6.4–8.3)

## 2016-03-03 MED ORDER — FOSAPREPITANT DIMEGLUMINE INJECTION 150 MG
Freq: Once | INTRAVENOUS | Status: AC
  Administered 2016-03-03: 11:00:00 via INTRAVENOUS
  Filled 2016-03-03: qty 5

## 2016-03-03 MED ORDER — DOXORUBICIN HCL CHEMO IV INJECTION 2 MG/ML
60.0000 mg/m2 | Freq: Once | INTRAVENOUS | Status: AC
Start: 2016-03-03 — End: 2016-03-03
  Administered 2016-03-03: 112 mg via INTRAVENOUS
  Filled 2016-03-03: qty 56

## 2016-03-03 MED ORDER — SODIUM CHLORIDE 0.9% FLUSH
10.0000 mL | INTRAVENOUS | Status: DC | PRN
  Administered 2016-03-03: 10 mL
  Filled 2016-03-03: qty 10

## 2016-03-03 MED ORDER — HEPARIN SOD (PORK) LOCK FLUSH 100 UNIT/ML IV SOLN
500.0000 [IU] | Freq: Once | INTRAVENOUS | Status: AC | PRN
  Administered 2016-03-03: 500 [IU]
  Filled 2016-03-03: qty 5

## 2016-03-03 MED ORDER — CYCLOPHOSPHAMIDE CHEMO INJECTION 1 GM
600.0000 mg/m2 | Freq: Once | INTRAMUSCULAR | Status: AC
  Administered 2016-03-03: 1120 mg via INTRAVENOUS
  Filled 2016-03-03: qty 56

## 2016-03-03 MED ORDER — PEGFILGRASTIM 6 MG/0.6ML ~~LOC~~ PSKT: 6.0000 mg | PREFILLED_SYRINGE | Freq: Once | SUBCUTANEOUS | Status: DC

## 2016-03-03 MED ORDER — PALONOSETRON HCL INJECTION 0.25 MG/5ML
INTRAVENOUS | Status: AC
  Filled 2016-03-03: qty 5

## 2016-03-03 MED ORDER — SODIUM CHLORIDE 0.9 % IV SOLN
Freq: Once | INTRAVENOUS | Status: AC
  Administered 2016-03-03: 11:00:00 via INTRAVENOUS

## 2016-03-03 MED ORDER — PALONOSETRON HCL INJECTION 0.25 MG/5ML
0.2500 mg | Freq: Once | INTRAVENOUS | Status: AC
  Administered 2016-03-03: 0.25 mg via INTRAVENOUS

## 2016-03-03 NOTE — Progress Notes (Signed)
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 Last week chemotherapy was held because of low blood counts. It was very unusual and I suspect that it may be because Neulasta wasn't deployed. She did not have any side effects to chemotherapy. She did not have any nausea or vomiting. Did not have any problems with appetite or eating. Did not have any mouth sores. Denies any fevers or chills.  REVIEW OF SYSTEMS:   Constitutional: Denies fevers, chills or abnormal weight loss Eyes: Denies blurriness of vision Ears, nose, mouth, throat, and face: Denies mucositis or sore throat Respiratory: Denies cough, dyspnea or wheezes Cardiovascular: Denies palpitation, chest discomfort Gastrointestinal:  Denies nausea, heartburn or change in  bowel habits Skin: Denies abnormal skin rashes Lymphatics: Denies new lymphadenopathy or easy bruising Neurological:Denies numbness, tingling or new weaknesses Behavioral/Psych: Mood is stable, no new changes  Extremities: No lower extremity edema Breast:  denies any pain or lumps or nodules in either breasts All other systems were reviewed with the patient and are negative.  I have reviewed the past medical history, past surgical history, social history and family history with the patient and they are unchanged from previous note.  ALLERGIES:  has No Known Allergies.  MEDICATIONS:  Current Outpatient Prescriptions  Medication Sig Dispense Refill  . acetaminophen (TYLENOL) 325 MG tablet Take 650 mg by mouth every 4 (four) hours as needed for mild pain.     . benztropine (COGENTIN) 0.5 MG tablet Take 0.5 mg by mouth 2 (two) times daily.    . divalproex (DEPAKOTE ER) 500 MG 24 hr tablet Take 1,000 mg by mouth at bedtime.    . furosemide (LASIX) 40 MG tablet Take 40 mg by mouth every morning.    . lidocaine-prilocaine (EMLA) cream Apply to affected area once 30 g 3  . lisinopril (PRINIVIL,ZESTRIL) 10 MG tablet Take 10 mg by mouth every morning.     . metFORMIN (GLUCOPHAGE) 500 MG tablet Take by mouth 2 (two) times daily with a meal.    . OLANZapine (ZYPREXA) 10 MG tablet Take 10 mg by mouth at bedtime.    Marland Kitchen omeprazole (PRILOSEC) 20 MG capsule Take 40 mg by mouth every morning.    . ondansetron (ZOFRAN) 8 MG tablet Take 1 tablet (8 mg total) by mouth 2 (two) times daily as needed. Start on  the third day after chemotherapy. 30 tablet 1  . oxyCODONE-acetaminophen (ROXICET) 5-325 MG tablet Take 1-2 tablets by mouth every 4 (four) hours as needed for moderate pain or severe pain. 30 tablet 0  . Polyethyl Glycol-Propyl Glycol (SYSTANE) 0.4-0.3 % SOLN Apply 1 drop to eye 4 (four) times daily.    . prochlorperazine (COMPAZINE) 10 MG tablet Take 1 tablet (10 mg total) by mouth every 6 (six) hours as  needed (Nausea or vomiting). 30 tablet 1  . simvastatin (ZOCOR) 10 MG tablet Take 10 mg by mouth at bedtime.     No current facility-administered medications for this visit.    PHYSICAL EXAMINATION: ECOG PERFORMANCE STATUS: 0 - Asymptomatic  Filed Vitals:   03/03/16 0902  BP: 117/73  Pulse: 97  Temp: 98 F (36.7 C)  Resp: 19   Filed Weights   03/03/16 0902  Weight: 179 lb 1.6 oz (81.239 kg)    GENERAL:alert, no distress and comfortable SKIN: skin color, texture, turgor are normal, no rashes or significant lesions EYES: normal, Conjunctiva are pink and non-injected, sclera clear OROPHARYNX:no exudate, no erythema and lips, buccal mucosa, and tongue normal  NECK: supple, thyroid normal size, non-tender, without nodularity LYMPH:  no palpable lymphadenopathy in the cervical, axillary or inguinal LUNGS: clear to auscultation and percussion with normal breathing effort HEART: regular rate & rhythm and no murmurs and no lower extremity edema ABDOMEN:abdomen soft, non-tender and normal bowel sounds MUSCULOSKELETAL:no cyanosis of digits and no clubbing  NEURO: alert & oriented x 3 with fluent speech, no focal motor/sensory deficits EXTREMITIES: No lower extremity edema  LABORATORY DATA:  I have reviewed the data as listed   Chemistry      Component Value Date/Time   NA 142 03/03/2016 0835   NA 145 01/14/2016 1420   K 4.0 03/03/2016 0835   K 4.3 01/14/2016 1420   CL 102 01/14/2016 1420   CO2 25 03/03/2016 0835   CO2 28 01/14/2016 1420   BUN 9.0 03/03/2016 0835   BUN 7 01/14/2016 1420   CREATININE 0.8 03/03/2016 0835   CREATININE 0.77 01/14/2016 1420      Component Value Date/Time   CALCIUM 8.6 03/03/2016 0835   CALCIUM 9.2 01/14/2016 1420   ALKPHOS 76 03/03/2016 0835   ALKPHOS 76 04/17/2014 0345   AST 13 03/03/2016 0835   AST 59* 04/17/2014 0345   ALT 11 03/03/2016 0835   ALT 66* 04/17/2014 0345   BILITOT <0.30 03/03/2016 0835   BILITOT <0.2* 04/17/2014 0345        Lab Results  Component Value Date   WBC 7.1 03/03/2016   HGB 10.3* 03/03/2016   HCT 31.3* 03/03/2016   MCV 85.9 03/03/2016   PLT 390 03/03/2016   NEUTROABS 4.1 03/03/2016     ASSESSMENT & PLAN:  Breast cancer of upper-outer quadrant of right female breast ([place]) Rt Lumpectomy 01/20/16: 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 Adjuvant chemotherapy (Adriamycin and Cytoxan dose dense 4 followed by Abraxane weekly 12) 2. Followed by radiation  -------------------------------------------------------------------------------------------------------------------------- Current Treatment: Cycle 2 day  1 Dose dense Adriamycin and Cytoxan  BRCA2: Recommended Bil Salpingo oophorectomy ECHO 02/08/16: EF 55%  Chemotherapy toxicities: 1. Neutropenia on day 1 of cycle 2: Very unusual and I suspect that is because the Neulasta was not properly deployed. Otherwise denies any nausea vomiting diarrhea or constipation. She has good energy good appetite and eating well.  We are monitoring her closely for toxicities. I did not prescribe dexamethasone because of her diabetes. Did not prescribe Ativan either RTC in 2 week for cycle 3   No orders of the defined types were placed in this encounter.   The patient has a good understanding of the overall plan. she agrees with it. she will call with any problems that may develop before the next visit here.   Rulon Eisenmenger, MD 03/03/2016

## 2016-03-03 NOTE — Telephone Encounter (Signed)
appt made and avs printed °

## 2016-03-03 NOTE — Progress Notes (Signed)
Unable to get in to exam room prior to MD.  No assessment performed.  

## 2016-03-03 NOTE — Progress Notes (Signed)
Spoke with patient about last treatment in regards to [facility].  Pt became neutropenic even with application of OnPro with last treatment.  Pt states she took the OnPro off "when it was done"; however, her facility told her she did not get all of the medication.  Due to this, recommendation made for patient to come to our facility for Neulasta injection.  Pt informed of plan of care and asked when pt could come in.  Pt set up with appointment for Saturday at 8:00.  Pt and her mother verbalized understanding and calendar and AVS given to patient.  Pharmacy notified and order entered.

## 2016-03-03 NOTE — Patient Instructions (Signed)
[facility] Discharge Instructions for Patients Receiving Chemotherapy  Today you received the following chemotherapy agents Doxorubicin, Cytoxan.   To help prevent nausea and vomiting after your treatment, we encourage you to take your nausea medication as prescribed.    If you develop nausea and vomiting that is not controlled by your nausea medication, call the clinic.   BELOW ARE SYMPTOMS THAT SHOULD BE REPORTED IMMEDIATELY:  *FEVER GREATER THAN 100.5 F  *CHILLS WITH OR WITHOUT FEVER  NAUSEA AND VOMITING THAT IS NOT CONTROLLED WITH YOUR NAUSEA MEDICATION  *UNUSUAL SHORTNESS OF BREATH  *UNUSUAL BRUISING OR BLEEDING  TENDERNESS IN MOUTH AND THROAT WITH OR WITHOUT PRESENCE OF ULCERS  *URINARY PROBLEMS  *BOWEL PROBLEMS  UNUSUAL RASH Items with * indicate a potential emergency and should be followed up as soon as possible.  Feel free to call the clinic you have any questions or concerns. The clinic phone number is [phone].  Please show the CHEMO ALERT CARD at check-in to the Emergency Department and triage nurse.  

## 2016-03-05 ENCOUNTER — Ambulatory Visit (HOSPITAL_BASED_OUTPATIENT_CLINIC_OR_DEPARTMENT_OTHER): Payer: Medicaid Other

## 2016-03-05 VITALS — BP 98/62 | HR 90 | Temp 97.6°F | Resp 18

## 2016-03-05 DIAGNOSIS — Z5189 Encounter for other specified aftercare: Secondary | ICD-10-CM

## 2016-03-05 DIAGNOSIS — C50411 Malignant neoplasm of upper-outer quadrant of right female breast: Secondary | ICD-10-CM | POA: Diagnosis not present

## 2016-03-05 DIAGNOSIS — C773 Secondary and unspecified malignant neoplasm of axilla and upper limb lymph nodes: Secondary | ICD-10-CM

## 2016-03-05 MED ORDER — PEGFILGRASTIM INJECTION 6 MG/0.6ML ~~LOC~~
6.0000 mg | PREFILLED_SYRINGE | Freq: Once | SUBCUTANEOUS | Status: AC
  Administered 2016-03-05: 6 mg via SUBCUTANEOUS

## 2016-03-05 NOTE — Patient Instructions (Signed)
Pegfilgrastim injection What is this medicine? PEGFILGRASTIM (PEG fil gra stim) is a long-acting granulocyte colony-stimulating factor that stimulates the growth of neutrophils, a type of white blood cell important in the body's fight against infection. It is used to reduce the incidence of fever and infection in patients with certain types of cancer who are receiving chemotherapy that affects the bone marrow, and to increase survival after being exposed to high doses of radiation. This medicine may be used for other purposes; ask your health care provider or pharmacist if you have questions. What should I tell my health care provider before I take this medicine? They need to know if you have any of these conditions: -kidney disease -latex allergy -ongoing radiation therapy -sickle cell disease -skin reactions to acrylic adhesives (On-Body Injector only) -an unusual or allergic reaction to pegfilgrastim, filgrastim, other medicines, foods, dyes, or preservatives -pregnant or trying to get pregnant -breast-feeding How should I use this medicine? This medicine is for injection under the skin. If you get this medicine at home, you will be taught how to prepare and give the pre-filled syringe or how to use the On-body Injector. Refer to the patient Instructions for Use for detailed instructions. Use exactly as directed. Take your medicine at regular intervals. Do not take your medicine more often than directed. It is important that you put your used needles and syringes in a special sharps container. Do not put them in a trash can. If you do not have a sharps container, call your pharmacist or healthcare provider to get one. Talk to your pediatrician regarding the use of this medicine in children. While this drug may be prescribed for selected conditions, precautions do apply. Overdosage: If you think you have taken too much of this medicine contact a poison control center or emergency room at  once. NOTE: This medicine is only for you. Do not share this medicine with others. What if I miss a dose? It is important not to miss your dose. Call your doctor or health care professional if you miss your dose. If you miss a dose due to an On-body Injector failure or leakage, a new dose should be administered as soon as possible using a single prefilled syringe for manual use. What may interact with this medicine? Interactions have not been studied. Give your health care provider a list of all the medicines, herbs, non-prescription drugs, or dietary supplements you use. Also tell them if you smoke, drink alcohol, or use illegal drugs. Some items may interact with your medicine. This list may not describe all possible interactions. Give your health care provider a list of all the medicines, herbs, non-prescription drugs, or dietary supplements you use. Also tell them if you smoke, drink alcohol, or use illegal drugs. Some items may interact with your medicine. What should I watch for while using this medicine? You may need blood work done while you are taking this medicine. If you are going to need a MRI, CT scan, or other procedure, tell your doctor that you are using this medicine (On-Body Injector only). What side effects may I notice from receiving this medicine? Side effects that you should report to your doctor or health care professional as soon as possible: -allergic reactions like skin rash, itching or hives, swelling of the face, lips, or tongue -dizziness -fever -pain, redness, or irritation at site where injected -pinpoint red spots on the skin -red or dark-brown urine -shortness of breath or breathing problems -stomach or side pain, or pain   at the shoulder -swelling -tiredness -trouble passing urine or change in the amount of urine Side effects that usually do not require medical attention (report to your doctor or health care professional if they continue or are  bothersome): -bone pain -muscle pain This list may not describe all possible side effects. Call your doctor for medical advice about side effects. You may report side effects to FDA at 1-800-FDA-1088. Where should I keep my medicine? Keep out of the reach of children. Store pre-filled syringes in a refrigerator between 2 and 8 degrees C (36 and 46 degrees F). Do not freeze. Keep in carton to protect from light. Throw away this medicine if it is left out of the refrigerator for more than 48 hours. Throw away any unused medicine after the expiration date. NOTE: This sheet is a summary. It may not cover all possible information. If you have questions about this medicine, talk to your doctor, pharmacist, or health care provider.    2016, [employer]vier/Gold Standard. (2014-12-18 14:30:14)  

## 2016-03-10 ENCOUNTER — Ambulatory Visit: Payer: Medicaid Other

## 2016-03-17 ENCOUNTER — Telehealth: Payer: Self-pay | Admitting: Hematology and Oncology

## 2016-03-17 ENCOUNTER — Other Ambulatory Visit (HOSPITAL_BASED_OUTPATIENT_CLINIC_OR_DEPARTMENT_OTHER): Payer: Medicaid Other

## 2016-03-17 ENCOUNTER — Ambulatory Visit (HOSPITAL_BASED_OUTPATIENT_CLINIC_OR_DEPARTMENT_OTHER): Payer: Medicaid Other | Admitting: Hematology and Oncology

## 2016-03-17 ENCOUNTER — Other Ambulatory Visit: Payer: Self-pay | Admitting: Hematology and Oncology

## 2016-03-17 ENCOUNTER — Encounter: Payer: Self-pay | Admitting: Hematology and Oncology

## 2016-03-17 ENCOUNTER — Ambulatory Visit (HOSPITAL_BASED_OUTPATIENT_CLINIC_OR_DEPARTMENT_OTHER): Payer: Medicaid Other

## 2016-03-17 ENCOUNTER — Telehealth: Payer: Self-pay | Admitting: *Deleted

## 2016-03-17 VITALS — BP 111/82 | HR 99 | Temp 97.9°F | Resp 18 | Ht 60.5 in | Wt 173.9 lb

## 2016-03-17 DIAGNOSIS — C50411 Malignant neoplasm of upper-outer quadrant of right female breast: Secondary | ICD-10-CM | POA: Diagnosis not present

## 2016-03-17 DIAGNOSIS — Z5111 Encounter for antineoplastic chemotherapy: Secondary | ICD-10-CM

## 2016-03-17 LAB — CBC WITH DIFFERENTIAL/PLATELET
BASO%: 1 % (ref 0.0–2.0)
Basophils Absolute: 0.1 10*3/uL (ref 0.0–0.1)
EOS%: 0.2 % (ref 0.0–7.0)
Eosinophils Absolute: 0 10*3/uL (ref 0.0–0.5)
HCT: 30.3 % — ABNORMAL LOW (ref 34.8–46.6)
HGB: 10.1 g/dL — ABNORMAL LOW (ref 11.6–15.9)
LYMPH%: 17.2 % (ref 14.0–49.7)
MCH: 28.4 pg (ref 25.1–34.0)
MCHC: 33.3 g/dL (ref 31.5–36.0)
MCV: 85.1 fL (ref 79.5–101.0)
MONO#: 0.6 10*3/uL (ref 0.1–0.9)
MONO%: 4.6 % (ref 0.0–14.0)
NEUT#: 9.8 10*3/uL — ABNORMAL HIGH (ref 1.5–6.5)
NEUT%: 77 % — ABNORMAL HIGH (ref 38.4–76.8)
Platelets: 159 10*3/uL (ref 145–400)
RBC: 3.56 10*6/uL — ABNORMAL LOW (ref 3.70–5.45)
RDW: 14.2 % (ref 11.2–14.5)
WBC: 12.7 10*3/uL — ABNORMAL HIGH (ref 3.9–10.3)
lymph#: 2.2 10*3/uL (ref 0.9–3.3)

## 2016-03-17 LAB — COMPREHENSIVE METABOLIC PANEL
ALT: 11 U/L (ref 0–55)
AST: 15 U/L (ref 5–34)
Albumin: 3.6 g/dL (ref 3.5–5.0)
Alkaline Phosphatase: 83 U/L (ref 40–150)
Anion Gap: 12 mEq/L — ABNORMAL HIGH (ref 3–11)
BUN: 7 mg/dL (ref 7.0–26.0)
CO2: 27 mEq/L (ref 22–29)
Calcium: 8.9 mg/dL (ref 8.4–10.4)
Chloride: 106 mEq/L (ref 98–109)
Creatinine: 0.8 mg/dL (ref 0.6–1.1)
EGFR: >90 mL/min/{1.73_m2} (ref >90)
Glucose: 156 mg/dl — ABNORMAL HIGH (ref 70–140)
Potassium: 3.7 mEq/L (ref 3.5–5.1)
Sodium: 144 mEq/L (ref 136–145)
Total Bilirubin: <0.3 mg/dL (ref 0.20–1.20)
Total Protein: 7 g/dL (ref 6.4–8.3)

## 2016-03-17 MED ORDER — CYCLOPHOSPHAMIDE CHEMO INJECTION 1 GM
600.0000 mg/m2 | Freq: Once | INTRAMUSCULAR | Status: AC
  Administered 2016-03-17: 1120 mg via INTRAVENOUS
  Filled 2016-03-17: qty 56

## 2016-03-17 MED ORDER — PALONOSETRON HCL INJECTION 0.25 MG/5ML
0.2500 mg | Freq: Once | INTRAVENOUS | Status: AC
  Administered 2016-03-17: 0.25 mg via INTRAVENOUS

## 2016-03-17 MED ORDER — SODIUM CHLORIDE 0.9% FLUSH
10.0000 mL | INTRAVENOUS | Status: DC | PRN
  Administered 2016-03-17: 10 mL
  Filled 2016-03-17: qty 10

## 2016-03-17 MED ORDER — SODIUM CHLORIDE 0.9 % IV SOLN
Freq: Once | INTRAVENOUS | Status: AC
  Administered 2016-03-17: 12:00:00 via INTRAVENOUS

## 2016-03-17 MED ORDER — SODIUM CHLORIDE 0.9 % IV SOLN
Freq: Once | INTRAVENOUS | Status: AC
  Administered 2016-03-17: 12:00:00 via INTRAVENOUS
  Filled 2016-03-17: qty 5

## 2016-03-17 MED ORDER — PALONOSETRON HCL INJECTION 0.25 MG/5ML
INTRAVENOUS | Status: AC
  Filled 2016-03-17: qty 5

## 2016-03-17 MED ORDER — HEPARIN SOD (PORK) LOCK FLUSH 100 UNIT/ML IV SOLN
500.0000 [IU] | Freq: Once | INTRAVENOUS | Status: AC | PRN
  Administered 2016-03-17: 500 [IU]
  Filled 2016-03-17: qty 5

## 2016-03-17 MED ORDER — DOXORUBICIN HCL CHEMO IV INJECTION 2 MG/ML
60.0000 mg/m2 | Freq: Once | INTRAVENOUS | Status: AC
  Administered 2016-03-17: 112 mg via INTRAVENOUS
  Filled 2016-03-17: qty 56

## 2016-03-17 NOTE — Patient Instructions (Signed)
[facility] Discharge Instructions for Patients Receiving Chemotherapy  Today you received the following chemotherapy agents Adriamycin and Cytoxan.  To help prevent nausea and vomiting after your treatment, we encourage you to take your nausea medication as directed - NO ZOFRAN FOR 3 DAYS   If you develop nausea and vomiting that is not controlled by your nausea medication, call the clinic.   BELOW ARE SYMPTOMS THAT SHOULD BE REPORTED IMMEDIATELY:  *FEVER GREATER THAN 100.5 F  *CHILLS WITH OR WITHOUT FEVER  NAUSEA AND VOMITING THAT IS NOT CONTROLLED WITH YOUR NAUSEA MEDICATION  *UNUSUAL SHORTNESS OF BREATH  *UNUSUAL BRUISING OR BLEEDING  TENDERNESS IN MOUTH AND THROAT WITH OR WITHOUT PRESENCE OF ULCERS  *URINARY PROBLEMS  *BOWEL PROBLEMS  UNUSUAL RASH Items with * indicate a potential emergency and should be followed up as soon as possible.  Feel free to call the clinic you have any questions or concerns. The clinic phone number is [phone].  Please show the [place] at check-in to the Emergency Department and triage nurse.

## 2016-03-17 NOTE — Assessment & Plan Note (Signed)
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 Adjuvant chemotherapy (Adriamycin and Cytoxan dose dense 4 followed by Abraxane weekly 12) 2. Followed by radiation  -------------------------------------------------------------------------------------------------------------------------- Current Treatment: Cycle 3 day 1 Dose dense Adriamycin and Cytoxan  BRCA2: Recommended Bil Salpingo oophorectomy ECHO 02/08/16: EF 55%  Chemotherapy toxicities: 1. Neutropenia on day 1 of cycle 2: Very unusual and I suspect that is because the Neulasta was not properly deployed. This has not happened with cycle 2 or subsequently.  Otherwise denies any nausea vomiting diarrhea or constipation. She has good energy good appetite and eating well.  We are monitoring her closely for toxicities. I did not prescribe dexamethasone because of her diabetes. Did not prescribe Ativan either RTC in 2 week for cycle 4

## 2016-03-17 NOTE — Telephone Encounter (Signed)
Per staff message and POF I have scheduled appts. Advised scheduler of appts. JMW  

## 2016-03-17 NOTE — Progress Notes (Signed)
Unable to get in to exam room prior to MD.  No assessment performed.  

## 2016-03-17 NOTE — Telephone Encounter (Signed)
appt made and avs printed. Staff message sent to [facility] on 4/20

## 2016-03-17 NOTE — Progress Notes (Signed)
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 Today is cycle 3 of dose dense mass and Cytoxan. She developed mild nausea after the last cycle of chemotherapy. But she has not had any vomiting. She denies any diarrhea or constipation.denies any fevers or chills.  REVIEW OF SYSTEMS:   Constitutional: Denies fevers, chills or abnormal weight loss Eyes: Denies blurriness of vision Ears, nose, mouth, throat, and face: Denies mucositis or sore throat Respiratory: Denies cough, dyspnea or wheezes Cardiovascular: Denies palpitation, chest discomfort Gastrointestinal:  Denies nausea, heartburn or change in bowel habits Skin: Denies abnormal skin rashes Lymphatics: Denies new lymphadenopathy or easy bruising Neurological:Denies numbness, tingling or new weaknesses Behavioral/Psych: Mood is  stable, no new changes  Extremities: No lower extremity edema Breast:  denies any pain or lumps or nodules in either breasts All other systems were reviewed with the patient and are negative.  I have reviewed the past medical history, past surgical history, social history and family history with the patient and they are unchanged from previous note.  ALLERGIES:  has No Known Allergies.  MEDICATIONS:  Current Outpatient Prescriptions  Medication Sig Dispense Refill  . acetaminophen (TYLENOL) 325 MG tablet Take 650 mg by mouth every 4 (four) hours as needed for mild pain.     . benztropine (COGENTIN) 0.5 MG tablet Take 0.5 mg by mouth 2 (two) times daily.    . divalproex (DEPAKOTE ER) 500 MG 24 hr tablet Take 1,000 mg by mouth at bedtime.    . furosemide (LASIX) 40 MG tablet Take 40 mg by mouth every morning.    . lidocaine-prilocaine (EMLA) cream Apply to affected area once 30 g 3  . lisinopril (PRINIVIL,ZESTRIL) 10 MG tablet Take 10 mg by mouth every morning.     . metFORMIN (GLUCOPHAGE) 500 MG tablet Take by mouth 2 (two) times daily with a meal.    . OLANZapine (ZYPREXA) 10 MG tablet Take 10 mg by mouth at bedtime.    Marland Kitchen omeprazole (PRILOSEC) 20 MG capsule Take 40 mg by mouth every morning.    . ondansetron (ZOFRAN) 8 MG tablet Take 1 tablet (8 mg total) by mouth 2 (two) times daily as needed. Start on the third day after chemotherapy. 30 tablet 1  . oxyCODONE-acetaminophen (ROXICET) 5-325 MG tablet Take 1-2 tablets by mouth every 4 (four) hours as needed for moderate pain or severe pain. 30 tablet 0  .  Polyethyl Glycol-Propyl Glycol (SYSTANE) 0.4-0.3 % SOLN Apply 1 drop to eye 4 (four) times daily.    . prochlorperazine (COMPAZINE) 10 MG tablet Take 1 tablet (10 mg total) by mouth every 6 (six) hours as needed (Nausea or vomiting). 30 tablet 1  . simvastatin (ZOCOR) 10 MG tablet Take 10 mg by mouth at bedtime.     No current facility-administered medications for this visit.     PHYSICAL EXAMINATION: ECOG PERFORMANCE STATUS: 1 - Symptomatic but completely ambulatory  Filed Vitals:   03/17/16 1040  BP: 111/82  Pulse: 99  Temp: 97.9 F (36.6 C)  Resp: 18   Filed Weights   03/17/16 1040  Weight: 173 lb 14.4 oz (78.881 kg)    GENERAL:alert, no distress and comfortable SKIN: skin color, texture, turgor are normal, no rashes or significant lesions EYES: normal, Conjunctiva are pink and non-injected, sclera clear OROPHARYNX:no exudate, no erythema and lips, buccal mucosa, and tongue normal  NECK: supple, thyroid normal size, non-tender, without nodularity LYMPH:  no palpable lymphadenopathy in the cervical, axillary or inguinal LUNGS: clear to auscultation and percussion with normal breathing effort HEART: regular rate & rhythm and no murmurs and no lower extremity edema ABDOMEN:abdomen soft, non-tender and normal bowel sounds MUSCULOSKELETAL:no cyanosis of digits and no clubbing  NEURO: alert & oriented x 3 with fluent speech, no focal motor/sensory deficits EXTREMITIES: No lower extremity edema LABORATORY DATA:  I have reviewed the data as listed   Chemistry      Component Value Date/Time   NA 142 03/03/2016 0835   NA 145 01/14/2016 1420   K 4.0 03/03/2016 0835   K 4.3 01/14/2016 1420   CL 102 01/14/2016 1420   CO2 25 03/03/2016 0835   CO2 28 01/14/2016 1420   BUN 9.0 03/03/2016 0835   BUN 7 01/14/2016 1420   CREATININE 0.8 03/03/2016 0835   CREATININE 0.77 01/14/2016 1420      Component Value Date/Time   CALCIUM 8.6 03/03/2016 0835   CALCIUM 9.2 01/14/2016 1420   ALKPHOS 76 03/03/2016 0835   ALKPHOS 76 04/17/2014 0345   AST 13 03/03/2016 0835   AST 59* 04/17/2014 0345   ALT 11 03/03/2016 0835   ALT 66* 04/17/2014 0345   BILITOT <0.30 03/03/2016 0835   BILITOT <0.2* 04/17/2014 0345       Lab Results  Component Value Date   WBC 12.7* 03/17/2016   HGB 10.1* 03/17/2016   HCT 30.3* 03/17/2016   MCV 85.1 03/17/2016   PLT 159  03/17/2016   NEUTROABS 9.8* 03/17/2016   ASSESSMENT & PLAN:  Breast cancer of upper-outer quadrant of right female breast ([place]) Rt Lumpectomy 01/20/16: 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 Adjuvant chemotherapy (Adriamycin and Cytoxan dose dense 4 followed by Abraxane weekly 12) 2. Followed by radiation  -------------------------------------------------------------------------------------------------------------------------- Current Treatment: Cycle 3 day 1 Dose dense Adriamycin and Cytoxan  BRCA2: Recommended Bil Salpingo oophorectomy ECHO 02/08/16: EF 55%  Chemotherapy toxicities: 1. Neutropenia on day 1 of cycle 2: Very unusual and I suspect that is because the Neulasta  was not properly deployed. This has not happened with cycle 2 or subsequently.  Otherwise denies any nausea vomiting diarrhea or constipation. She has good energy good appetite and eating well.  We are monitoring her closely for toxicities. I did not prescribe dexamethasone because of her diabetes. Did not prescribe Ativan either RTC in 2 week for cycle 4   No orders of the defined types were placed in this encounter.   The patient has a good understanding of the overall plan. she agrees with it. she will call with any problems that may develop before the next visit here.   Rulon Eisenmenger, MD 03/17/2016

## 2016-03-19 ENCOUNTER — Ambulatory Visit (HOSPITAL_BASED_OUTPATIENT_CLINIC_OR_DEPARTMENT_OTHER): Payer: Medicaid Other

## 2016-03-19 VITALS — BP 117/81 | HR 91 | Temp 98.1°F | Resp 20

## 2016-03-19 DIAGNOSIS — C50411 Malignant neoplasm of upper-outer quadrant of right female breast: Secondary | ICD-10-CM | POA: Diagnosis not present

## 2016-03-19 MED ORDER — PEGFILGRASTIM INJECTION 6 MG/0.6ML ~~LOC~~
6.0000 mg | PREFILLED_SYRINGE | Freq: Once | SUBCUTANEOUS | Status: AC
  Administered 2016-03-19: 6 mg via SUBCUTANEOUS

## 2016-03-19 NOTE — Patient Instructions (Signed)
Pegfilgrastim injection What is this medicine? PEGFILGRASTIM (PEG fil gra stim) is a long-acting granulocyte colony-stimulating factor that stimulates the growth of neutrophils, a type of white blood cell important in the body's fight against infection. It is used to reduce the incidence of fever and infection in patients with certain types of cancer who are receiving chemotherapy that affects the bone marrow, and to increase survival after being exposed to high doses of radiation. This medicine may be used for other purposes; ask your health care provider or pharmacist if you have questions. What should I tell my health care provider before I take this medicine? They need to know if you have any of these conditions: -kidney disease -latex allergy -ongoing radiation therapy -sickle cell disease -skin reactions to acrylic adhesives (On-Body Injector only) -an unusual or allergic reaction to pegfilgrastim, filgrastim, other medicines, foods, dyes, or preservatives -pregnant or trying to get pregnant -breast-feeding How should I use this medicine? This medicine is for injection under the skin. If you get this medicine at home, you will be taught how to prepare and give the pre-filled syringe or how to use the On-body Injector. Refer to the patient Instructions for Use for detailed instructions. Use exactly as directed. Take your medicine at regular intervals. Do not take your medicine more often than directed. It is important that you put your used needles and syringes in a special sharps container. Do not put them in a trash can. If you do not have a sharps container, call your pharmacist or healthcare provider to get one. Talk to your pediatrician regarding the use of this medicine in children. While this drug may be prescribed for selected conditions, precautions do apply. Overdosage: If you think you have taken too much of this medicine contact a poison control center or emergency room at  once. NOTE: This medicine is only for you. Do not share this medicine with others. What if I miss a dose? It is important not to miss your dose. Call your doctor or health care professional if you miss your dose. If you miss a dose due to an On-body Injector failure or leakage, a new dose should be administered as soon as possible using a single prefilled syringe for manual use. What may interact with this medicine? Interactions have not been studied. Give your health care provider a list of all the medicines, herbs, non-prescription drugs, or dietary supplements you use. Also tell them if you smoke, drink alcohol, or use illegal drugs. Some items may interact with your medicine. This list may not describe all possible interactions. Give your health care provider a list of all the medicines, herbs, non-prescription drugs, or dietary supplements you use. Also tell them if you smoke, drink alcohol, or use illegal drugs. Some items may interact with your medicine. What should I watch for while using this medicine? You may need blood work done while you are taking this medicine. If you are going to need a MRI, CT scan, or other procedure, tell your doctor that you are using this medicine (On-Body Injector only). What side effects may I notice from receiving this medicine? Side effects that you should report to your doctor or health care professional as soon as possible: -allergic reactions like skin rash, itching or hives, swelling of the face, lips, or tongue -dizziness -fever -pain, redness, or irritation at site where injected -pinpoint red spots on the skin -red or dark-brown urine -shortness of breath or breathing problems -stomach or side pain, or pain   at the shoulder -swelling -tiredness -trouble passing urine or change in the amount of urine Side effects that usually do not require medical attention (report to your doctor or health care professional if they continue or are  bothersome): -bone pain -muscle pain This list may not describe all possible side effects. Call your doctor for medical advice about side effects. You may report side effects to FDA at 1-800-FDA-1088. Where should I keep my medicine? Keep out of the reach of children. Store pre-filled syringes in a refrigerator between 2 and 8 degrees C (36 and 46 degrees F). Do not freeze. Keep in carton to protect from light. Throw away this medicine if it is left out of the refrigerator for more than 48 hours. Throw away any unused medicine after the expiration date. NOTE: This sheet is a summary. It may not cover all possible information. If you have questions about this medicine, talk to your doctor, pharmacist, or health care provider.    2016, [employer]vier/Gold Standard. (2014-12-18 14:30:14)  

## 2016-03-24 ENCOUNTER — Ambulatory Visit: Payer: Medicaid Other

## 2016-03-28 ENCOUNTER — Other Ambulatory Visit (HOSPITAL_COMMUNITY): Payer: Self-pay | Admitting: Cardiology

## 2016-03-28 DIAGNOSIS — I509 Heart failure, unspecified: Secondary | ICD-10-CM

## 2016-03-29 ENCOUNTER — Ambulatory Visit (HOSPITAL_COMMUNITY)
Admission: RE | Admit: 2016-03-29 | Discharge: 2016-03-29 | Disposition: A | Discharge status: Home / Self Care | Payer: Medicaid Other | Source: Ambulatory Visit | Attending: Family Medicine | Admitting: Family Medicine

## 2016-03-29 ENCOUNTER — Telehealth: Payer: Self-pay | Admitting: *Deleted

## 2016-03-29 ENCOUNTER — Ambulatory Visit (HOSPITAL_BASED_OUTPATIENT_CLINIC_OR_DEPARTMENT_OTHER)
Admission: RE | Admit: 2016-03-29 | Discharge: 2016-03-29 | Disposition: A | Discharge status: Home / Self Care | Payer: Medicaid Other | Source: Ambulatory Visit | Attending: Cardiology | Admitting: Cardiology

## 2016-03-29 VITALS — BP 104/66 | HR 92 | Resp 18 | Wt 170.5 lb

## 2016-03-29 DIAGNOSIS — I509 Heart failure, unspecified: Secondary | ICD-10-CM | POA: Diagnosis present

## 2016-03-29 DIAGNOSIS — I340 Nonrheumatic mitral (valve) insufficiency: Secondary | ICD-10-CM | POA: Diagnosis not present

## 2016-03-29 DIAGNOSIS — E785 Hyperlipidemia, unspecified: Secondary | ICD-10-CM | POA: Insufficient documentation

## 2016-03-29 DIAGNOSIS — E119 Type 2 diabetes mellitus without complications: Secondary | ICD-10-CM | POA: Insufficient documentation

## 2016-03-29 DIAGNOSIS — I110 Hypertensive heart disease with heart failure: Secondary | ICD-10-CM | POA: Insufficient documentation

## 2016-03-29 DIAGNOSIS — C50411 Malignant neoplasm of upper-outer quadrant of right female breast: Secondary | ICD-10-CM | POA: Diagnosis not present

## 2016-03-29 DIAGNOSIS — Z720 Tobacco use: Secondary | ICD-10-CM | POA: Diagnosis not present

## 2016-03-29 NOTE — Patient Instructions (Signed)
We will contact you in 3 months to schedule your next appointment and echocardiogram  

## 2016-03-29 NOTE — Telephone Encounter (Signed)
FYI  Call received from Alison [name] with [facility] [phone]) asking for something for acid reflux.  Reports Alison [name] has been lying in bed C/O pain.  Alison [name] encouraged her to get up and eat breakfast.  Ate small meal but c/o stomach ache.  Denies nausea.  No fever.  Spoke with Alison [name] who reports"last bm was yesterday with some straining.  Rates pain as ten on pain scale.  Pain comes and goes to mid, lower abdomen near navel."  Denies pain to back and no increased pain with movement.  Advised she drink more water and try pain meds.  Alison [name] denies any prilosec, stool softeners or Oxycodone in the [facility] for [place].  "She will see PCP Alison [name] on 04-11-2016 and I will call PCP for these.  No further questions.

## 2016-03-29 NOTE — Progress Notes (Signed)
*  PRELIMINARY RESULTS* Echocardiogram 2D Echocardiogram has been performed.  Leavy Cella 03/29/2016, 3:18 PM

## 2016-03-30 NOTE — Progress Notes (Signed)
Patient ID: Alison [name], female   DOB: 1961-02-02, 55 y.o.   MRN: [id number] PCP: Dr. Berdine Addison Oncologist: Dr. Lindi Adie  55 yo with history of HTN, diabetes, schizophrenia, and recent breast cancer diagnosis presents for cardio-oncology clinic evaluation.  Breast cancer was diagnosed 2/17 with right lumpectomy. ER-/PR-/HER2-.  BRCA2+.  She started chemotherapy in 3/17 with Adriamycin + Cytoxan x 4, then abraxane weekly x 12.  This will be followed by radiation.  She has one cycle of Adriamycin left.  She has no history of cardiac problems.  She used to smoke but quit 01/25/16.  No exertional dyspnea or chest pain.   PMH: 1. HTN 2. Diabetes 3. Hyperlipidemia 4. Schizophrenia: Lives in group home.  5. Breast cancer: Diagnosed 2/17 with right lumpectomy. ER-/PR-/HER2-.  BRCA2+.  She will start chemotherapy in 3/17 with Adriamycin + Cytoxan x 4, then abraxane weekly x 12.  This will be followed by radiation.  - Echo (2/17) with EF 55%, lateral s' 8.8 cm/sec, GLS -19.6%, normal RV size and systolic function. - Echo (4/17) with EF 55%, normal RV, GLS -16%, mild MR.    SH: Lives in group home.  Mother brings her to appts.  Quit smoking in 01/25/16.   FH: Breast cancer, no CAD.  ROS: All systems reviewed and negative except as per HPI.   Current Outpatient Prescriptions  Medication Sig Dispense Refill  . acetaminophen (TYLENOL) 325 MG tablet Take 650 mg by mouth every 4 (four) hours as needed for mild pain.     . benztropine (COGENTIN) 0.5 MG tablet Take 0.5 mg by mouth 2 (two) times daily.    . Cyclophosphamide (CYTOXAN IJ) Inject as directed every 14 (fourteen) days.    . divalproex (DEPAKOTE ER) 500 MG 24 hr tablet Take 1,000 mg by mouth at bedtime.    Marland Kitchen DOXOrubicin HCl (ADRIAMYCIN IV) Inject into the vein every 14 (fourteen) days.    . furosemide (LASIX) 40 MG tablet Take 40 mg by mouth every morning.    . lidocaine-prilocaine (EMLA) cream Apply to affected area once 30 g 3  . lisinopril  (PRINIVIL,ZESTRIL) 10 MG tablet Take 10 mg by mouth every morning.     . metFORMIN (GLUCOPHAGE) 500 MG tablet Take by mouth 2 (two) times daily with a meal.    . OLANZapine (ZYPREXA) 10 MG tablet Take 10 mg by mouth at bedtime.    Marland Kitchen omeprazole (PRILOSEC) 20 MG capsule Take 40 mg by mouth every morning.    . ondansetron (ZOFRAN) 8 MG tablet Take 1 tablet (8 mg total) by mouth 2 (two) times daily as needed. Start on the third day after chemotherapy. 30 tablet 1  . oxyCODONE-acetaminophen (ROXICET) 5-325 MG tablet Take 1-2 tablets by mouth every 4 (four) hours as needed for moderate pain or severe pain. 30 tablet 0  . Polyethyl Glycol-Propyl Glycol (SYSTANE) 0.4-0.3 % SOLN Apply 1 drop to eye 4 (four) times daily.    . prochlorperazine (COMPAZINE) 10 MG tablet Take 1 tablet (10 mg total) by mouth every 6 (six) hours as needed (Nausea or vomiting). 30 tablet 1  . simvastatin (ZOCOR) 10 MG tablet Take 10 mg by mouth at bedtime.     No current facility-administered medications for this encounter.   BP 104/66 mmHg  Pulse 92  Resp 18  Wt 170 lb 8 oz (77.338 kg)  SpO2 98% General: NAD Neck: No JVD, no thyromegaly or thyroid nodule.  Lungs: Clear to auscultation bilaterally with normal respiratory effort. CV: Nondisplaced  PMI.  Heart regular S1/S2, no S3/S4, no murmur.  No peripheral edema.  No carotid bruit.  Normal pedal pulses.  Abdomen: Soft, nontender, no hepatosplenomegaly, no distention.  Skin: Intact without lesions or rashes.  Neurologic: Alert and oriented x 3.  Psych: Normal affect. Extremities: No clubbing or cyanosis.  HEENT: Normal.   Assessment/Plan: 1. Breast cancer: Her main cardiac risk will be from Adriamycin.  She will get 4 cycles, has had 3 so far.  I reviewed today's echo, it appears stable compared to the prior.  I will have her return with repeat echo in 3 months. 2. HTN: BP is controlled on lisinopril.  Alison [name] 03/30/2016

## 2016-03-31 ENCOUNTER — Ambulatory Visit (HOSPITAL_BASED_OUTPATIENT_CLINIC_OR_DEPARTMENT_OTHER): Payer: Medicaid Other

## 2016-03-31 ENCOUNTER — Other Ambulatory Visit (HOSPITAL_BASED_OUTPATIENT_CLINIC_OR_DEPARTMENT_OTHER): Payer: Medicaid Other

## 2016-03-31 ENCOUNTER — Encounter: Payer: Self-pay | Admitting: Hematology and Oncology

## 2016-03-31 ENCOUNTER — Ambulatory Visit (HOSPITAL_BASED_OUTPATIENT_CLINIC_OR_DEPARTMENT_OTHER): Payer: Medicaid Other | Admitting: Hematology and Oncology

## 2016-03-31 ENCOUNTER — Telehealth: Payer: Self-pay | Admitting: Hematology and Oncology

## 2016-03-31 VITALS — BP 118/67 | HR 103 | Temp 97.4°F | Resp 18 | Wt 173.1 lb

## 2016-03-31 DIAGNOSIS — Z5111 Encounter for antineoplastic chemotherapy: Secondary | ICD-10-CM | POA: Diagnosis present

## 2016-03-31 DIAGNOSIS — C50411 Malignant neoplasm of upper-outer quadrant of right female breast: Secondary | ICD-10-CM

## 2016-03-31 DIAGNOSIS — C773 Secondary and unspecified malignant neoplasm of axilla and upper limb lymph nodes: Secondary | ICD-10-CM | POA: Diagnosis not present

## 2016-03-31 DIAGNOSIS — D6481 Anemia due to antineoplastic chemotherapy: Secondary | ICD-10-CM | POA: Diagnosis not present

## 2016-03-31 LAB — COMPREHENSIVE METABOLIC PANEL
ALT: 10 U/L (ref 0–55)
AST: 15 U/L (ref 5–34)
Albumin: 3.4 g/dL — ABNORMAL LOW (ref 3.5–5.0)
Alkaline Phosphatase: 94 U/L (ref 40–150)
Anion Gap: 11 mEq/L (ref 3–11)
BUN: 8.1 mg/dL (ref 7.0–26.0)
CO2: 26 mEq/L (ref 22–29)
Calcium: 8.6 mg/dL (ref 8.4–10.4)
Chloride: 106 mEq/L (ref 98–109)
Creatinine: 0.7 mg/dL (ref 0.6–1.1)
EGFR: >90 mL/min/{1.73_m2} (ref >90)
Glucose: 165 mg/dl — ABNORMAL HIGH (ref 70–140)
Potassium: 3.5 mEq/L (ref 3.5–5.1)
Sodium: 143 mEq/L (ref 136–145)
Total Bilirubin: <0.3 mg/dL (ref 0.20–1.20)
Total Protein: 6.4 g/dL (ref 6.4–8.3)

## 2016-03-31 LAB — CBC WITH DIFFERENTIAL/PLATELET
BASO%: 0.7 % (ref 0.0–2.0)
Basophils Absolute: 0.1 10*3/uL (ref 0.0–0.1)
EOS%: 0.1 % (ref 0.0–7.0)
Eosinophils Absolute: 0 10*3/uL (ref 0.0–0.5)
HCT: 26.6 % — ABNORMAL LOW (ref 34.8–46.6)
HGB: 8.9 g/dL — ABNORMAL LOW (ref 11.6–15.9)
LYMPH%: 10.2 % — ABNORMAL LOW (ref 14.0–49.7)
MCH: 28.8 pg (ref 25.1–34.0)
MCHC: 33.5 g/dL (ref 31.5–36.0)
MCV: 86.1 fL (ref 79.5–101.0)
MONO#: 0.9 10*3/uL (ref 0.1–0.9)
MONO%: 6.5 % (ref 0.0–14.0)
NEUT#: 11.9 10*3/uL — ABNORMAL HIGH (ref 1.5–6.5)
NEUT%: 82.5 % — ABNORMAL HIGH (ref 38.4–76.8)
Platelets: 268 10*3/uL (ref 145–400)
RBC: 3.09 10*6/uL — ABNORMAL LOW (ref 3.70–5.45)
RDW: 16 % — ABNORMAL HIGH (ref 11.2–14.5)
WBC: 14.4 10*3/uL — ABNORMAL HIGH (ref 3.9–10.3)
lymph#: 1.5 10*3/uL (ref 0.9–3.3)
nRBC: 3 % — ABNORMAL HIGH (ref 0–0)

## 2016-03-31 MED ORDER — SODIUM CHLORIDE 0.9 % IV SOLN
Freq: Once | INTRAVENOUS | Status: AC
  Administered 2016-03-31: 11:00:00 via INTRAVENOUS
  Filled 2016-03-31: qty 5

## 2016-03-31 MED ORDER — DOXORUBICIN HCL CHEMO IV INJECTION 2 MG/ML
60.0000 mg/m2 | Freq: Once | INTRAVENOUS | Status: AC
  Administered 2016-03-31: 112 mg via INTRAVENOUS
  Filled 2016-03-31: qty 56

## 2016-03-31 MED ORDER — PALONOSETRON HCL INJECTION 0.25 MG/5ML
0.2500 mg | Freq: Once | INTRAVENOUS | Status: AC
  Administered 2016-03-31: 0.25 mg via INTRAVENOUS

## 2016-03-31 MED ORDER — SODIUM CHLORIDE 0.9 % IV SOLN
Freq: Once | INTRAVENOUS | Status: AC
  Administered 2016-03-31: 11:00:00 via INTRAVENOUS

## 2016-03-31 MED ORDER — HEPARIN SOD (PORK) LOCK FLUSH 100 UNIT/ML IV SOLN
500.0000 [IU] | Freq: Once | INTRAVENOUS | Status: AC | PRN
  Administered 2016-03-31: 500 [IU]
  Filled 2016-03-31: qty 5

## 2016-03-31 MED ORDER — SODIUM CHLORIDE 0.9% FLUSH
10.0000 mL | INTRAVENOUS | Status: DC | PRN
  Administered 2016-03-31: 10 mL
  Filled 2016-03-31: qty 10

## 2016-03-31 MED ORDER — PALONOSETRON HCL INJECTION 0.25 MG/5ML
INTRAVENOUS | Status: AC
  Filled 2016-03-31: qty 5

## 2016-03-31 MED ORDER — CYCLOPHOSPHAMIDE CHEMO INJECTION 1 GM
600.0000 mg/m2 | Freq: Once | INTRAMUSCULAR | Status: AC
  Administered 2016-03-31: 1120 mg via INTRAVENOUS
  Filled 2016-03-31: qty 56

## 2016-03-31 NOTE — Telephone Encounter (Signed)
appt made and avs printed °

## 2016-03-31 NOTE — Assessment & Plan Note (Signed)
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 Adjuvant chemotherapy (Adriamycin and Cytoxan dose dense 4 followed by Abraxane weekly 12) 2. Followed by radiation  -------------------------------------------------------------------------------------------------------------------------- Current Treatment: Cycle 4 day 1 Dose dense Adriamycin and Cytoxan  BRCA2: Recommended Bil Salpingo oophorectomy ECHO 02/08/16: EF 55%  Chemotherapy toxicities: 1. Neutropenia on day 1 of cycle 2: Very unusual and I suspect that is because the Neulasta was not properly deployed. This has not happened with cycle 2 or subsequently.  Otherwise denies any nausea vomiting diarrhea or constipation. She has good energy good appetite and eating well.  We are monitoring her closely for toxicities. I did not prescribe dexamethasone because of her diabetes. Did not prescribe Ativan either RTC in 2 week for cycle 1 Abraxane

## 2016-03-31 NOTE — Progress Notes (Signed)
Patient Care Team: Iona Beard, MD as PCP - General (Family Medicine)  DIAGNOSIS: No matching staging information was found for the patient.  SUMMARY OF ONCOLOGIC HISTORY:   Breast cancer of upper-outer quadrant of right female breast ([place])   11/25/2015 Mammogram Right breast mass 12 cm from the nipple: 1.4 x 1.3 x 0.9 cm, two right axillary lymph nodes with borderline cortical thickening measuring up to 3 mm   12/04/2015 Initial Diagnosis Right breast biopsy 10:00: Invasive ductal carcinoma with DCIS, grade 2, ER 0%, PR 0%, HER-2 negative, ratio1.31, Ki-67 40%   01/13/2016 Procedure 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   01/20/2016 Surgery Rt Lumpectomy: IDC 1.7 cm, grade 3, with DCIS, 0/2 LN, ER 0%, PR 0%, Her 2 Neg Ratio 1.35, Ki 67: 40%, T1CN0 (stage 1A)   02/11/2016 -  Chemotherapy Adjuvant chemotherapy with dose dense Adriamycin and Cytoxan 4 followed by Abraxane weekly 12    CHIEF COMPLIANT: cycle 4 dose dense Adriamycin and Cytoxan  INTERVAL HISTORY: Alison [name] is a 55 year old with above-mentioned history of right breast cancer currently on adjuvant chemotherapy. Today's cycle 4 of dose dense Adriamycin and Cytoxan. She reports overall feeling quite well. She does have nausea and vomiting that lasted for 1 day. She took a nausea medication and has resolved her symptoms. She has excellent appetite and has been eating quite well.  REVIEW OF SYSTEMS:   Constitutional: Denies fevers, chills or abnormal weight loss Eyes: Denies blurriness of vision Ears, nose, mouth, throat, and face: Denies mucositis or sore throat Respiratory: Denies cough, dyspnea or wheezes Cardiovascular: Denies palpitation, chest discomfort Gastrointestinal:  Denies nausea, heartburn or change in bowel habits Skin: Denies abnormal skin rashes Lymphatics: Denies  new lymphadenopathy or easy bruising Neurological:Denies numbness, tingling or new weaknesses Behavioral/Psych: Mood is stable, no new changes  Extremities: No lower extremity edema Breast:  denies any pain or lumps or nodules in either breasts All other systems were reviewed with the patient and are negative.  I have reviewed the past medical history, past surgical history, social history and family history with the patient and they are unchanged from previous note.  ALLERGIES:  has No Known Allergies.  MEDICATIONS:  Current Outpatient Prescriptions  Medication Sig Dispense Refill  . acetaminophen (TYLENOL) 325 MG tablet Take 650 mg by mouth every 4 (four) hours as needed for mild pain.     . benztropine (COGENTIN) 0.5 MG tablet Take 0.5 mg by mouth 2 (two) times daily.    . Cyclophosphamide (CYTOXAN IJ) Inject as directed every 14 (fourteen) days.    . divalproex (DEPAKOTE ER) 500 MG 24 hr tablet Take 1,000 mg by mouth at bedtime.    Marland Kitchen DOXOrubicin HCl (ADRIAMYCIN IV) Inject into the vein every 14 (fourteen) days.    . furosemide (LASIX) 40 MG tablet Take 40 mg by mouth every morning.    . lidocaine-prilocaine (EMLA) cream Apply to affected area once 30 g 3  . lisinopril (PRINIVIL,ZESTRIL) 10 MG tablet Take 10 mg by mouth every morning.     . metFORMIN (GLUCOPHAGE) 500 MG tablet Take by mouth 2 (two) times daily with a meal.    . OLANZapine (ZYPREXA) 10 MG tablet Take 10 mg by mouth at bedtime.    Marland Kitchen omeprazole (PRILOSEC) 20 MG capsule Take 40 mg by mouth every morning.    . ondansetron (ZOFRAN) 8 MG tablet Take 1 tablet (  8 mg total) by mouth 2 (two) times daily as needed. Start on the third day after chemotherapy. 30 tablet 1  . oxyCODONE-acetaminophen (ROXICET) 5-325 MG tablet Take 1-2 tablets by mouth every 4 (four) hours as needed for moderate pain or severe pain. 30 tablet 0  . Polyethyl Glycol-Propyl Glycol (SYSTANE) 0.4-0.3 % SOLN Apply 1 drop to eye 4 (four) times daily.    .  prochlorperazine (COMPAZINE) 10 MG tablet Take 1 tablet (10 mg total) by mouth every 6 (six) hours as needed (Nausea or vomiting). 30 tablet 1  . simvastatin (ZOCOR) 10 MG tablet Take 10 mg by mouth at bedtime.     No current facility-administered medications for this visit.    PHYSICAL EXAMINATION: ECOG PERFORMANCE STATUS: 0 - Asymptomatic  Filed Vitals:   03/31/16 0939  BP: 118/67  Pulse: 103  Temp: 97.4 F (36.3 C)  Resp: 18   Filed Weights   03/31/16 0939  Weight: 173 lb 1.6 oz (78.518 kg)    GENERAL:alert, no distress and comfortable SKIN: skin color, texture, turgor are normal, no rashes or significant lesions EYES: normal, Conjunctiva are pink and non-injected, sclera clear OROPHARYNX:no exudate, no erythema and lips, buccal mucosa, and tongue normal  NECK: supple, thyroid normal size, non-tender, without nodularity LYMPH:  no palpable lymphadenopathy in the cervical, axillary or inguinal LUNGS: clear to auscultation and percussion with normal breathing effort HEART: regular rate & rhythm and no murmurs and no lower extremity edema ABDOMEN:abdomen soft, non-tender and normal bowel sounds MUSCULOSKELETAL:no cyanosis of digits and no clubbing  NEURO: alert & oriented x 3 with fluent speech, no focal motor/sensory deficits EXTREMITIES: No lower extremity edema  LABORATORY DATA:  I have reviewed the data as listed   Chemistry      Component Value Date/Time   NA 144 03/17/2016 1016   NA 145 01/14/2016 1420   K 3.7 03/17/2016 1016   K 4.3 01/14/2016 1420   CL 102 01/14/2016 1420   CO2 27 03/17/2016 1016   CO2 28 01/14/2016 1420   BUN 7.0 03/17/2016 1016   BUN 7 01/14/2016 1420   CREATININE 0.8 03/17/2016 1016   CREATININE 0.77 01/14/2016 1420      Component Value Date/Time   CALCIUM 8.9 03/17/2016 1016   CALCIUM 9.2 01/14/2016 1420   ALKPHOS 83 03/17/2016 1016   ALKPHOS 76 04/17/2014 0345   AST 15 03/17/2016 1016   AST 59* 04/17/2014 0345   ALT 11  03/17/2016 1016   ALT 66* 04/17/2014 0345   BILITOT <0.30 03/17/2016 1016   BILITOT <0.2* 04/17/2014 0345       Lab Results  Component Value Date   WBC 14.4* 03/31/2016   HGB 8.9* 03/31/2016   HCT 26.6* 03/31/2016   MCV 86.1 03/31/2016   PLT 268 03/31/2016   NEUTROABS 11.9* 03/31/2016   ASSESSMENT & PLAN:  Breast cancer of upper-outer quadrant of right female breast ([place]) Rt Lumpectomy 01/20/16: 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 Adjuvant chemotherapy (Adriamycin and Cytoxan dose dense 4 followed by Abraxane weekly 12)  2. Followed by radiation  -------------------------------------------------------------------------------------------------------------------------- Current Treatment: Cycle 4 day 1 Dose dense Adriamycin and Cytoxan  BRCA2: Recommended Bil Salpingo oophorectomy ECHO 02/08/16: EF 55%  Chemotherapy toxicities: 1. Intermittent nausea 2. Anemia due to chemotherapy  She has good energy good appetite and eating well.  We are monitoring her closely for toxicities. I did not prescribe dexamethasone because of her diabetes. RTC in 2 week for cycle 1 Abraxane   No orders of the defined types were placed in this encounter.   The patient has a good understanding of the overall plan. she agrees with it. she will call with any problems that may develop before the next visit here.   Rulon Eisenmenger, MD 03/31/2016

## 2016-03-31 NOTE — Patient Instructions (Signed)
[facility] Discharge Instructions for Patients Receiving Chemotherapy  Today you received the following chemotherapy agents Adriamycin and Cytoxan.  To help prevent nausea and vomiting after your treatment, we encourage you to take your nausea medication as directed - NO ZOFRAN FOR 3 DAYS   If you develop nausea and vomiting that is not controlled by your nausea medication, call the clinic.   BELOW ARE SYMPTOMS THAT SHOULD BE REPORTED IMMEDIATELY:  *FEVER GREATER THAN 100.5 F  *CHILLS WITH OR WITHOUT FEVER  NAUSEA AND VOMITING THAT IS NOT CONTROLLED WITH YOUR NAUSEA MEDICATION  *UNUSUAL SHORTNESS OF BREATH  *UNUSUAL BRUISING OR BLEEDING  TENDERNESS IN MOUTH AND THROAT WITH OR WITHOUT PRESENCE OF ULCERS  *URINARY PROBLEMS  *BOWEL PROBLEMS  UNUSUAL RASH Items with * indicate a potential emergency and should be followed up as soon as possible.  Feel free to call the clinic you have any questions or concerns. The clinic phone number is [phone].  Please show the [facility] at check-in to the Emergency Department and triage nurse.

## 2016-04-02 ENCOUNTER — Ambulatory Visit (HOSPITAL_BASED_OUTPATIENT_CLINIC_OR_DEPARTMENT_OTHER): Payer: Medicaid Other

## 2016-04-02 VITALS — BP 114/64 | HR 102 | Temp 98.3°F

## 2016-04-02 DIAGNOSIS — Z5189 Encounter for other specified aftercare: Secondary | ICD-10-CM

## 2016-04-02 DIAGNOSIS — C773 Secondary and unspecified malignant neoplasm of axilla and upper limb lymph nodes: Secondary | ICD-10-CM

## 2016-04-02 DIAGNOSIS — C50411 Malignant neoplasm of upper-outer quadrant of right female breast: Secondary | ICD-10-CM

## 2016-04-02 MED ORDER — PEGFILGRASTIM INJECTION 6 MG/0.6ML ~~LOC~~
6.0000 mg | PREFILLED_SYRINGE | Freq: Once | SUBCUTANEOUS | Status: AC
  Administered 2016-04-02: 6 mg via SUBCUTANEOUS

## 2016-04-02 NOTE — Patient Instructions (Signed)
Pegfilgrastim injection What is this medicine? PEGFILGRASTIM (PEG fil gra stim) is a long-acting granulocyte colony-stimulating factor that stimulates the growth of neutrophils, a type of white blood cell important in the body's fight against infection. It is used to reduce the incidence of fever and infection in patients with certain types of cancer who are receiving chemotherapy that affects the bone marrow, and to increase survival after being exposed to high doses of radiation. This medicine may be used for other purposes; ask your health care provider or pharmacist if you have questions. What should I tell my health care provider before I take this medicine? They need to know if you have any of these conditions: -kidney disease -latex allergy -ongoing radiation therapy -sickle cell disease -skin reactions to acrylic adhesives (On-Body Injector only) -an unusual or allergic reaction to pegfilgrastim, filgrastim, other medicines, foods, dyes, or preservatives -pregnant or trying to get pregnant -breast-feeding How should I use this medicine? This medicine is for injection under the skin. If you get this medicine at home, you will be taught how to prepare and give the pre-filled syringe or how to use the On-body Injector. Refer to the patient Instructions for Use for detailed instructions. Use exactly as directed. Take your medicine at regular intervals. Do not take your medicine more often than directed. It is important that you put your used needles and syringes in a special sharps container. Do not put them in a trash can. If you do not have a sharps container, call your pharmacist or healthcare provider to get one. Talk to your pediatrician regarding the use of this medicine in children. While this drug may be prescribed for selected conditions, precautions do apply. Overdosage: If you think you have taken too much of this medicine contact a poison control center or emergency room at  once. NOTE: This medicine is only for you. Do not share this medicine with others. What if I miss a dose? It is important not to miss your dose. Call your doctor or health care professional if you miss your dose. If you miss a dose due to an On-body Injector failure or leakage, a new dose should be administered as soon as possible using a single prefilled syringe for manual use. What may interact with this medicine? Interactions have not been studied. Give your health care provider a list of all the medicines, herbs, non-prescription drugs, or dietary supplements you use. Also tell them if you smoke, drink alcohol, or use illegal drugs. Some items may interact with your medicine. This list may not describe all possible interactions. Give your health care provider a list of all the medicines, herbs, non-prescription drugs, or dietary supplements you use. Also tell them if you smoke, drink alcohol, or use illegal drugs. Some items may interact with your medicine. What should I watch for while using this medicine? You may need blood work done while you are taking this medicine. If you are going to need a MRI, CT scan, or other procedure, tell your doctor that you are using this medicine (On-Body Injector only). What side effects may I notice from receiving this medicine? Side effects that you should report to your doctor or health care professional as soon as possible: -allergic reactions like skin rash, itching or hives, swelling of the face, lips, or tongue -dizziness -fever -pain, redness, or irritation at site where injected -pinpoint red spots on the skin -red or dark-brown urine -shortness of breath or breathing problems -stomach or side pain, or pain   at the shoulder -swelling -tiredness -trouble passing urine or change in the amount of urine Side effects that usually do not require medical attention (report to your doctor or health care professional if they continue or are  bothersome): -bone pain -muscle pain This list may not describe all possible side effects. Call your doctor for medical advice about side effects. You may report side effects to FDA at 1-800-FDA-1088. Where should I keep my medicine? Keep out of the reach of children. Store pre-filled syringes in a refrigerator between 2 and 8 degrees C (36 and 46 degrees F). Do not freeze. Keep in carton to protect from light. Throw away this medicine if it is left out of the refrigerator for more than 48 hours. Throw away any unused medicine after the expiration date. NOTE: This sheet is a summary. It may not cover all possible information. If you have questions about this medicine, talk to your doctor, pharmacist, or health care provider.    2016, [employer]vier/Gold Standard. (2014-12-18 14:30:14)  

## 2016-04-04 ENCOUNTER — Other Ambulatory Visit: Payer: Self-pay | Admitting: Hematology and Oncology

## 2016-04-04 DIAGNOSIS — C50411 Malignant neoplasm of upper-outer quadrant of right female breast: Secondary | ICD-10-CM

## 2016-04-04 NOTE — Telephone Encounter (Signed)
Attempted to call Alison [name] and her mother to determine to which address to send the results letter and additional copies of her test report.

## 2016-04-14 ENCOUNTER — Encounter: Payer: Self-pay | Admitting: Hematology and Oncology

## 2016-04-14 ENCOUNTER — Telehealth: Payer: Self-pay

## 2016-04-14 ENCOUNTER — Ambulatory Visit (HOSPITAL_BASED_OUTPATIENT_CLINIC_OR_DEPARTMENT_OTHER): Payer: Medicaid Other | Admitting: Hematology and Oncology

## 2016-04-14 ENCOUNTER — Ambulatory Visit (HOSPITAL_BASED_OUTPATIENT_CLINIC_OR_DEPARTMENT_OTHER): Payer: Medicaid Other

## 2016-04-14 ENCOUNTER — Telehealth: Payer: Self-pay | Admitting: Hematology and Oncology

## 2016-04-14 ENCOUNTER — Other Ambulatory Visit (HOSPITAL_BASED_OUTPATIENT_CLINIC_OR_DEPARTMENT_OTHER): Payer: Medicaid Other

## 2016-04-14 VITALS — BP 113/64 | HR 94 | Temp 97.9°F | Resp 18 | Ht 60.0 in | Wt 163.6 lb

## 2016-04-14 DIAGNOSIS — R630 Anorexia: Secondary | ICD-10-CM

## 2016-04-14 DIAGNOSIS — R110 Nausea: Secondary | ICD-10-CM

## 2016-04-14 DIAGNOSIS — C50411 Malignant neoplasm of upper-outer quadrant of right female breast: Secondary | ICD-10-CM

## 2016-04-14 DIAGNOSIS — Z5111 Encounter for antineoplastic chemotherapy: Secondary | ICD-10-CM

## 2016-04-14 DIAGNOSIS — L658 Other specified nonscarring hair loss: Secondary | ICD-10-CM

## 2016-04-14 DIAGNOSIS — D6481 Anemia due to antineoplastic chemotherapy: Secondary | ICD-10-CM

## 2016-04-14 LAB — CBC WITH DIFFERENTIAL/PLATELET
BASO%: 0.2 % (ref 0.0–2.0)
Basophils Absolute: 0 10*3/uL (ref 0.0–0.1)
EOS%: 0.1 % (ref 0.0–7.0)
Eosinophils Absolute: 0 10*3/uL (ref 0.0–0.5)
HCT: 26.5 % — ABNORMAL LOW (ref 34.8–46.6)
HGB: 8.8 g/dL — ABNORMAL LOW (ref 11.6–15.9)
LYMPH%: 12.9 % — ABNORMAL LOW (ref 14.0–49.7)
MCH: 28.9 pg (ref 25.1–34.0)
MCHC: 33.3 g/dL (ref 31.5–36.0)
MCV: 86.8 fL (ref 79.5–101.0)
MONO#: 0.6 10*3/uL (ref 0.1–0.9)
MONO%: 5.9 % (ref 0.0–14.0)
NEUT#: 8.7 10*3/uL — ABNORMAL HIGH (ref 1.5–6.5)
NEUT%: 80.9 % — ABNORMAL HIGH (ref 38.4–76.8)
Platelets: 153 10*3/uL (ref 145–400)
RBC: 3.06 10*6/uL — ABNORMAL LOW (ref 3.70–5.45)
RDW: 17.4 % — ABNORMAL HIGH (ref 11.2–14.5)
WBC: 10.7 10*3/uL — ABNORMAL HIGH (ref 3.9–10.3)
lymph#: 1.4 10*3/uL (ref 0.9–3.3)

## 2016-04-14 LAB — COMPREHENSIVE METABOLIC PANEL
ALT: 11 U/L (ref 0–55)
AST: 14 U/L (ref 5–34)
Albumin: 3.7 g/dL (ref 3.5–5.0)
Alkaline Phosphatase: 96 U/L (ref 40–150)
Anion Gap: 12 mEq/L — ABNORMAL HIGH (ref 3–11)
BUN: 12.9 mg/dL (ref 7.0–26.0)
CO2: 26 mEq/L (ref 22–29)
Calcium: 8.6 mg/dL (ref 8.4–10.4)
Chloride: 105 mEq/L (ref 98–109)
Creatinine: 0.9 mg/dL (ref 0.6–1.1)
EGFR: 85 mL/min/{1.73_m2} — ABNORMAL LOW (ref >90)
Glucose: 150 mg/dl — ABNORMAL HIGH (ref 70–140)
Potassium: 3.4 mEq/L — ABNORMAL LOW (ref 3.5–5.1)
Sodium: 142 mEq/L (ref 136–145)
Total Bilirubin: <0.3 mg/dL (ref 0.20–1.20)
Total Protein: 6.9 g/dL (ref 6.4–8.3)

## 2016-04-14 MED ORDER — SODIUM CHLORIDE 0.9% FLUSH
10.0000 mL | INTRAVENOUS | Status: DC | PRN
  Administered 2016-04-14: 10 mL
  Filled 2016-04-14: qty 10

## 2016-04-14 MED ORDER — SODIUM CHLORIDE 0.9 % IV SOLN: Freq: Once | INTRAVENOUS | Status: DC

## 2016-04-14 MED ORDER — PROCHLORPERAZINE MALEATE 10 MG PO TABS
10.0000 mg | ORAL_TABLET | Freq: Once | ORAL | Status: AC
  Administered 2016-04-14: 10 mg via ORAL

## 2016-04-14 MED ORDER — PALONOSETRON HCL INJECTION 0.25 MG/5ML
INTRAVENOUS | Status: AC
Start: 2016-04-14 — End: 2016-04-14
  Filled 2016-04-14: qty 5

## 2016-04-14 MED ORDER — HEPARIN SOD (PORK) LOCK FLUSH 100 UNIT/ML IV SOLN
500.0000 [IU] | Freq: Once | INTRAVENOUS | Status: AC | PRN
  Administered 2016-04-14: 500 [IU]
  Filled 2016-04-14: qty 5

## 2016-04-14 MED ORDER — PACLITAXEL PROTEIN-BOUND CHEMO INJECTION 100 MG
80.0000 mg/m2 | Freq: Once | INTRAVENOUS | Status: AC
  Administered 2016-04-14: 150 mg via INTRAVENOUS
  Filled 2016-04-14: qty 30

## 2016-04-14 MED ORDER — PROCHLORPERAZINE MALEATE 10 MG PO TABS
ORAL_TABLET | ORAL | Status: AC
  Filled 2016-04-14: qty 1

## 2016-04-14 MED ORDER — PALONOSETRON HCL INJECTION 0.25 MG/5ML
0.2500 mg | Freq: Once | INTRAVENOUS | Status: AC
  Administered 2016-04-14: 0.25 mg via INTRAVENOUS

## 2016-04-14 MED ORDER — SODIUM CHLORIDE 0.9 % IV SOLN
INTRAVENOUS | Status: AC
  Administered 2016-04-14: 10:00:00 via INTRAVENOUS

## 2016-04-14 NOTE — Telephone Encounter (Signed)
Added ov appt to 5/18 visit date per 5/34 pof. avs printed

## 2016-04-14 NOTE — Progress Notes (Signed)
Patient Care Team: Iona Beard, MD as PCP - General (Family Medicine)  SUMMARY OF ONCOLOGIC HISTORY:   Breast cancer of upper-outer quadrant of right female breast ([place])   11/25/2015 Mammogram Right breast mass 12 cm from the nipple: 1.4 x 1.3 x 0.9 cm, two right axillary lymph nodes with borderline cortical thickening measuring up to 3 mm   12/04/2015 Initial Diagnosis Right breast biopsy 10:00: Invasive ductal carcinoma with DCIS, grade 2, ER 0%, PR 0%, HER-2 negative, ratio1.31, Ki-67 40%   01/13/2016 Procedure 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   01/20/2016 Surgery Rt Lumpectomy: IDC 1.7 cm, grade 3, with DCIS, 0/2 LN, ER 0%, PR 0%, Her 2 Neg Ratio 1.35, Ki 67: 40%, T1CN0 (stage 1A)   02/11/2016 -  Chemotherapy Adjuvant chemotherapy with dose dense Adriamycin and Cytoxan 4 followed by Abraxane weekly 12    CHIEF COMPLIANT: Cycle 1 Abraxane  INTERVAL HISTORY: Alison [name] is a 55 year old with above-mentioned history right breast cancer currently on adjuvant chemotherapy today cycle 1 of Abraxane. She had tolerated 4 cycles of dose dense Adriamycin Cytoxan extremely well. She complains of profound nausea vomiting after the last cycle of chemotherapy but she never called [place] and let us know that. She just threw up in the bathroom. She is feeling fairly nauseated and has lost some weight.. She did feel moderately fatigued. Denied any abdominal pain or constipation.  REVIEW OF SYSTEMS:   Constitutional: Denies fevers, chills or abnormal weight loss Eyes: Denies blurriness of vision Ears, nose, mouth, throat, and face: Denies mucositis or sore throat Respiratory: Denies cough, dyspnea or wheezes Cardiovascular: Denies palpitation, chest discomfort Gastrointestinal:  Denies nausea, heartburn or change in bowel habits Skin: Denies abnormal skin  rashes Lymphatics: Denies new lymphadenopathy or easy bruising Neurological:Denies numbness, tingling or new weaknesses Behavioral/Psych: Mood is stable, no new changes  Extremities: No lower extremity edema Breast:  denies any pain or lumps or nodules in either breasts All other systems were reviewed with the patient and are negative.  I have reviewed the past medical history, past surgical history, social history and family history with the patient and they are unchanged from previous note.  ALLERGIES:  has No Known Allergies.  MEDICATIONS:  Current Outpatient Prescriptions  Medication Sig Dispense Refill  . acetaminophen (TYLENOL) 325 MG tablet Take 650 mg by mouth every 4 (four) hours as needed for mild pain.     . benztropine (COGENTIN) 0.5 MG tablet Take 0.5 mg by mouth 2 (two) times daily.    . Cyclophosphamide (CYTOXAN IJ) Inject as directed every 14 (fourteen) days.    . divalproex (DEPAKOTE ER) 500 MG 24 hr tablet Take 1,000 mg by mouth at bedtime.    Marland Kitchen DOXOrubicin HCl (ADRIAMYCIN IV) Inject into the vein every 14 (fourteen) days.    . furosemide (LASIX) 40 MG tablet Take 40 mg by mouth every morning.    Marland Kitchen lisinopril (PRINIVIL,ZESTRIL) 10 MG tablet Take 10 mg by mouth every morning.     . metFORMIN (GLUCOPHAGE) 500 MG tablet Take by mouth 2 (two) times daily with a meal.    . OLANZapine (ZYPREXA) 10 MG tablet Take 10 mg by mouth at bedtime.    Marland Kitchen omeprazole (PRILOSEC) 20 MG capsule Take 40 mg by mouth every morning.    Marland Kitchen oxyCODONE-acetaminophen (ROXICET) 5-325 MG tablet Take 1-2 tablets by mouth every 4 (four) hours as needed  for moderate pain or severe pain. 30 tablet 0  . Polyethyl Glycol-Propyl Glycol (SYSTANE) 0.4-0.3 % SOLN Apply 1 drop to eye 4 (four) times daily.    . simvastatin (ZOCOR) 10 MG tablet Take 10 mg by mouth at bedtime.     No current facility-administered medications for this visit.    PHYSICAL EXAMINATION: ECOG PERFORMANCE STATUS: 1 - Symptomatic but  completely ambulatory  Filed Vitals:   04/14/16 0835  BP: 113/64  Pulse: 94  Temp: 97.9 F (36.6 C)  Resp: 18   Filed Weights   04/14/16 0835  Weight: 163 lb 9.6 oz (74.208 kg)    GENERAL:alert, no distress and comfortable SKIN: skin color, texture, turgor are normal, no rashes or significant lesions EYES: normal, Conjunctiva are pink and non-injected, sclera clear OROPHARYNX:no exudate, no erythema and lips, buccal mucosa, and tongue normal  NECK: supple, thyroid normal size, non-tender, without nodularity LYMPH:  no palpable lymphadenopathy in the cervical, axillary or inguinal LUNGS: clear to auscultation and percussion with normal breathing effort HEART: regular rate & rhythm and no murmurs and no lower extremity edema ABDOMEN:abdomen soft, non-tender and normal bowel sounds MUSCULOSKELETAL:no cyanosis of digits and no clubbing  NEURO: alert & oriented x 3 with fluent speech, no focal motor/sensory deficits EXTREMITIES: No lower extremity edema  LABORATORY DATA:  I have reviewed the data as listed   Chemistry      Component Value Date/Time   NA 142 04/14/2016 0805   NA 145 01/14/2016 1420   K 3.4* 04/14/2016 0805   K 4.3 01/14/2016 1420   CL 102 01/14/2016 1420   CO2 26 04/14/2016 0805   CO2 28 01/14/2016 1420   BUN 12.9 04/14/2016 0805   BUN 7 01/14/2016 1420   CREATININE 0.9 04/14/2016 0805   CREATININE 0.77 01/14/2016 1420      Component Value Date/Time   CALCIUM 8.6 04/14/2016 0805   CALCIUM 9.2 01/14/2016 1420   ALKPHOS 96 04/14/2016 0805   ALKPHOS 76 04/17/2014 0345   AST 14 04/14/2016 0805   AST 59* 04/17/2014 0345   ALT 11 04/14/2016 0805   ALT 66* 04/17/2014 0345   BILITOT <0.30 04/14/2016 0805   BILITOT <0.2* 04/17/2014 0345       Lab Results  Component Value Date   WBC 10.7* 04/14/2016   HGB 8.8* 04/14/2016   HCT 26.5* 04/14/2016   MCV 86.8 04/14/2016   PLT 153 04/14/2016   NEUTROABS 8.7* 04/14/2016     ASSESSMENT & PLAN:  Breast  cancer of upper-outer quadrant of right female breast ([place]) Rt Lumpectomy 01/20/16: 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 Adjuvant chemotherapy (Adriamycin and Cytoxan dose dense 4 followed by Abraxane weekly 12) 2. Followed by radiation  -------------------------------------------------------------------------------------------------------------------------- Current Treatment: Completed 4 cycles of Dose dense Adriamycin and Cytoxan ; today is cycle 1/12 Abraxane BRCA2: Recommended Bil Salpingo oophorectomy ECHO 02/08/16: EF 55%  Chemotherapy toxicities: 1. Profound nausea: I will give her IV fluids with Aloxi today. 2. Anemia due to chemotherapy 3. Decreased appetite and taste 4. Alopecia  I  discussed with her that since she completed 4 cycles of Adriamycin and Cytoxan, she may be less nauseated with Abraxane treatments.  We are monitoring her closely for toxicities. RTC in 2 week for cycle 3 Abraxane  No orders of the defined types were placed in this encounter.   The patient has a good understanding of the overall plan. she agrees with it. she will call with any problems that may develop before the next visit here.   Rulon Eisenmenger, MD 04/14/2016

## 2016-04-14 NOTE — Assessment & Plan Note (Signed)
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 Adjuvant chemotherapy (Adriamycin and Cytoxan dose dense 4 followed by Abraxane weekly 12) 2. Followed by radiation  -------------------------------------------------------------------------------------------------------------------------- Current Treatment: Completed 4 cycles of Dose dense Adriamycin and Cytoxan ; today is cycle 1/12 Abraxane BRCA2: Recommended Bil Salpingo oophorectomy ECHO 02/08/16: EF 55%  Chemotherapy toxicities: 1. Intermittent nausea 2. Anemia due to chemotherapy  She has good energy good appetite and eating well.  We are monitoring her closely for toxicities. I did not prescribe dexamethasone because of her diabetes. RTC in 2 week for cycle 3 Abraxane

## 2016-04-14 NOTE — Patient Instructions (Signed)

## 2016-04-14 NOTE — Telephone Encounter (Signed)
Returned call to pts group home, [facility] in regards to pts anti-emetics.  Prescription called in to [facility] per callers request.  Informed group home that these would be ready for pt today.  No further questions or concerns at time of call.

## 2016-04-15 ENCOUNTER — Telehealth: Payer: Self-pay

## 2016-04-15 NOTE — Telephone Encounter (Signed)
-----   Message from Ma Hillock, RN sent at 04/14/2016 11:40 AM EDT ----- Regarding: [place]: 1st Abraxane Patient received 1st dose of Abraxane Thursday and tolerated the infusion without complication. Please call her to follow-up.  Thanks.

## 2016-04-15 NOTE — Telephone Encounter (Signed)
Called [name] for chemotherapy F/U.   Spoke with caregiver as patient was out, caregiver states that patient complained of sob, nausea yesterday but felt better today and went to class.. Advised caregiver to have patient call if she had any questions

## 2016-04-21 ENCOUNTER — Ambulatory Visit (HOSPITAL_BASED_OUTPATIENT_CLINIC_OR_DEPARTMENT_OTHER): Payer: Medicaid Other

## 2016-04-21 ENCOUNTER — Other Ambulatory Visit (HOSPITAL_BASED_OUTPATIENT_CLINIC_OR_DEPARTMENT_OTHER): Payer: Medicaid Other

## 2016-04-21 VITALS — BP 123/80 | HR 94 | Temp 98.4°F | Resp 19

## 2016-04-21 DIAGNOSIS — Z5111 Encounter for antineoplastic chemotherapy: Secondary | ICD-10-CM | POA: Diagnosis not present

## 2016-04-21 DIAGNOSIS — C50411 Malignant neoplasm of upper-outer quadrant of right female breast: Secondary | ICD-10-CM | POA: Diagnosis not present

## 2016-04-21 LAB — CBC WITH DIFFERENTIAL/PLATELET
BASO%: 0.5 % (ref 0.0–2.0)
Basophils Absolute: 0 10*3/uL (ref 0.0–0.1)
EOS%: 0.2 % (ref 0.0–7.0)
Eosinophils Absolute: 0 10*3/uL (ref 0.0–0.5)
HCT: 24.8 % — ABNORMAL LOW (ref 34.8–46.6)
HGB: 8.2 g/dL — ABNORMAL LOW (ref 11.6–15.9)
LYMPH%: 25.1 % (ref 14.0–49.7)
MCH: 28.8 pg (ref 25.1–34.0)
MCHC: 33.1 g/dL (ref 31.5–36.0)
MCV: 87 fL (ref 79.5–101.0)
MONO#: 0.3 10*3/uL (ref 0.1–0.9)
MONO%: 7.2 % (ref 0.0–14.0)
NEUT#: 2.7 10*3/uL (ref 1.5–6.5)
NEUT%: 67 % (ref 38.4–76.8)
Platelets: 288 10*3/uL (ref 145–400)
RBC: 2.85 10*6/uL — ABNORMAL LOW (ref 3.70–5.45)
RDW: 17 % — ABNORMAL HIGH (ref 11.2–14.5)
WBC: 4 10*3/uL (ref 3.9–10.3)
lymph#: 1 10*3/uL (ref 0.9–3.3)

## 2016-04-21 LAB — COMPREHENSIVE METABOLIC PANEL
ALT: 11 U/L (ref 0–55)
AST: 11 U/L (ref 5–34)
Albumin: 3.6 g/dL (ref 3.5–5.0)
Alkaline Phosphatase: 73 U/L (ref 40–150)
Anion Gap: 11 mEq/L (ref 3–11)
BUN: 11.5 mg/dL (ref 7.0–26.0)
CO2: 26 mEq/L (ref 22–29)
Calcium: 9 mg/dL (ref 8.4–10.4)
Chloride: 105 mEq/L (ref 98–109)
Creatinine: 0.8 mg/dL (ref 0.6–1.1)
EGFR: >90 mL/min/{1.73_m2} (ref >90)
Glucose: 133 mg/dl (ref 70–140)
Potassium: 3.7 mEq/L (ref 3.5–5.1)
Sodium: 142 mEq/L (ref 136–145)
Total Bilirubin: <0.3 mg/dL (ref 0.20–1.20)
Total Protein: 6.7 g/dL (ref 6.4–8.3)

## 2016-04-21 MED ORDER — SODIUM CHLORIDE 0.9 % IV SOLN
Freq: Once | INTRAVENOUS | Status: AC
  Administered 2016-04-21: 09:00:00 via INTRAVENOUS

## 2016-04-21 MED ORDER — PROCHLORPERAZINE MALEATE 10 MG PO TABS
10.0000 mg | ORAL_TABLET | Freq: Once | ORAL | Status: AC
  Administered 2016-04-21: 10 mg via ORAL

## 2016-04-21 MED ORDER — PROCHLORPERAZINE MALEATE 10 MG PO TABS
ORAL_TABLET | ORAL | Status: AC
  Filled 2016-04-21: qty 1

## 2016-04-21 MED ORDER — PACLITAXEL PROTEIN-BOUND CHEMO INJECTION 100 MG
80.0000 mg/m2 | Freq: Once | INTRAVENOUS | Status: AC
  Administered 2016-04-21: 150 mg via INTRAVENOUS
  Filled 2016-04-21: qty 30

## 2016-04-21 MED ORDER — HEPARIN SOD (PORK) LOCK FLUSH 100 UNIT/ML IV SOLN
500.0000 [IU] | Freq: Once | INTRAVENOUS | Status: AC | PRN
  Administered 2016-04-21: 500 [IU]
  Filled 2016-04-21: qty 5

## 2016-04-21 MED ORDER — SODIUM CHLORIDE 0.9% FLUSH
10.0000 mL | INTRAVENOUS | Status: DC | PRN
  Administered 2016-04-21: 10 mL
  Filled 2016-04-21: qty 10

## 2016-04-21 NOTE — Patient Instructions (Signed)
Nanoparticle Albumin-Bound Paclitaxel injection What is this medicine? NANOPARTICLE ALBUMIN-BOUND PACLITAXEL (Na no PAHR ti kuhl al BYOO muhn-bound PAK li TAX el) is a chemotherapy drug. It targets fast dividing cells, like cancer cells, and causes these cells to die. This medicine is used to treat advanced breast cancer and advanced lung cancer. This medicine may be used for other purposes; ask your health care provider or pharmacist if you have questions. What should I tell my health care provider before I take this medicine? They need to know if you have any of these conditions: -kidney disease -liver disease -low blood counts, like low platelets, red blood cells, or white blood cells -recent or ongoing radiation therapy -an unusual or allergic reaction to paclitaxel, albumin, other chemotherapy, other medicines, foods, dyes, or preservatives -pregnant or trying to get pregnant -breast-feeding How should I use this medicine? This drug is given as an infusion into a vein. It is administered in a hospital or clinic by a specially trained health care professional. Talk to your pediatrician regarding the use of this medicine in children. Special care may be needed. Overdosage: If you think you have taken too much of this medicine contact a poison control center or emergency room at once. NOTE: This medicine is only for you. Do not share this medicine with others. What if I miss a dose? It is important not to miss your dose. Call your doctor or health care professional if you are unable to keep an appointment. What may interact with this medicine? -cyclosporine -diazepam -ketoconazole -medicines to increase blood counts like filgrastim, pegfilgrastim, sargramostim -other chemotherapy drugs like cisplatin, doxorubicin, epirubicin, etoposide, teniposide, vincristine -quinidine -testosterone -vaccines -verapamil Talk to your doctor or health care professional before taking any of these  medicines: -acetaminophen -aspirin -ibuprofen -ketoprofen -naproxen This list may not describe all possible interactions. Give your health care provider a list of all the medicines, herbs, non-prescription drugs, or dietary supplements you use. Also tell them if you smoke, drink alcohol, or use illegal drugs. Some items may interact with your medicine. What should I watch for while using this medicine? Your condition will be monitored carefully while you are receiving this medicine. You will need important blood work done while you are taking this medicine. This drug may make you feel generally unwell. This is not uncommon, as chemotherapy can affect healthy cells as well as cancer cells. Report any side effects. Continue your course of treatment even though you feel ill unless your doctor tells you to stop. In some cases, you may be given additional medicines to help with side effects. Follow all directions for their use. Call your doctor or health care professional for advice if you get a fever, chills or sore throat, or other symptoms of a cold or flu. Do not treat yourself. This drug decreases your body's ability to fight infections. Try to avoid being around people who are sick. This medicine may increase your risk to bruise or bleed. Call your doctor or health care professional if you notice any unusual bleeding. Be careful brushing and flossing your teeth or using a toothpick because you may get an infection or bleed more easily. If you have any dental work done, tell your dentist you are receiving this medicine. Avoid taking products that contain aspirin, acetaminophen, ibuprofen, naproxen, or ketoprofen unless instructed by your doctor. These medicines may hide a fever. Do not become pregnant while taking this medicine. Women should inform their doctor if they wish to become pregnant or  think they might be pregnant. There is a potential for serious side effects to an unborn child. Talk to  your health care professional or pharmacist for more information. Do not breast-feed an infant while taking this medicine. Men are advised not to father a child while receiving this medicine. What side effects may I notice from receiving this medicine? Side effects that you should report to your doctor or health care professional as soon as possible: -allergic reactions like skin rash, itching or hives, swelling of the face, lips, or tongue -low blood counts - This drug may decrease the number of white blood cells, red blood cells and platelets. You may be at increased risk for infections and bleeding. -signs of infection - fever or chills, cough, sore throat, pain or difficulty passing urine -signs of decreased platelets or bleeding - bruising, pinpoint red spots on the skin, black, tarry stools, nosebleeds -signs of decreased red blood cells - unusually weak or tired, fainting spells, lightheadedness -breathing problems -changes in vision -chest pain -high or low blood pressure -mouth sores -nausea and vomiting -pain, swelling, redness or irritation at the injection site -pain, tingling, numbness in the hands or feet -slow or irregular heartbeat -swelling of the ankle, feet, hands Side effects that usually do not require medical attention (report to your doctor or health care professional if they continue or are bothersome): -aches, pains -changes in the color of fingernails -diarrhea -hair loss -loss of appetite This list may not describe all possible side effects. Call your doctor for medical advice about side effects. You may report side effects to FDA at 1-800-FDA-1088. Where should I keep my medicine? This drug is given in a hospital or clinic and will not be stored at home. NOTE: This sheet is a summary. It may not cover all possible information. If you have questions about this medicine, talk to your doctor, pharmacist, or health care provider.    2016, [employer].  (2013-01-21 16:48:50) [facility] Discharge Instructions for Patients Receiving Chemotherapy  Today you received the following chemotherapy agents Abraxane  To help prevent nausea and vomiting after your treatment, we encourage you to take your nausea medication  As prescribed by your physicican. If you develop nausea and vomiting that is not controlled by your nausea medication, call the clinic.   BELOW ARE SYMPTOMS THAT SHOULD BE REPORTED IMMEDIATELY:  *FEVER GREATER THAN 100.5 F  *CHILLS WITH OR WITHOUT FEVER  NAUSEA AND VOMITING THAT IS NOT CONTROLLED WITH YOUR NAUSEA MEDICATION  *UNUSUAL SHORTNESS OF BREATH  *UNUSUAL BRUISING OR BLEEDING  TENDERNESS IN MOUTH AND THROAT WITH OR WITHOUT PRESENCE OF ULCERS  *URINARY PROBLEMS  *BOWEL PROBLEMS  UNUSUAL RASH Items with * indicate a potential emergency and should be followed up as soon as possible.  Feel free to call the clinic you have any questions or concerns. The clinic phone number is [phone].  Please show the [place] at check-in to the Emergency Department and triage nurse.

## 2016-04-28 ENCOUNTER — Telehealth: Payer: Self-pay | Admitting: *Deleted

## 2016-04-28 ENCOUNTER — Other Ambulatory Visit (HOSPITAL_BASED_OUTPATIENT_CLINIC_OR_DEPARTMENT_OTHER): Payer: Medicaid Other

## 2016-04-28 ENCOUNTER — Ambulatory Visit (HOSPITAL_BASED_OUTPATIENT_CLINIC_OR_DEPARTMENT_OTHER): Payer: Medicaid Other | Admitting: Hematology and Oncology

## 2016-04-28 ENCOUNTER — Telehealth: Payer: Self-pay | Admitting: Hematology and Oncology

## 2016-04-28 ENCOUNTER — Ambulatory Visit (HOSPITAL_BASED_OUTPATIENT_CLINIC_OR_DEPARTMENT_OTHER): Payer: Medicaid Other

## 2016-04-28 VITALS — BP 109/88 | HR 114 | Temp 98.5°F | Resp 18 | Wt 165.7 lb

## 2016-04-28 DIAGNOSIS — L658 Other specified nonscarring hair loss: Secondary | ICD-10-CM | POA: Diagnosis not present

## 2016-04-28 DIAGNOSIS — C773 Secondary and unspecified malignant neoplasm of axilla and upper limb lymph nodes: Secondary | ICD-10-CM

## 2016-04-28 DIAGNOSIS — Z5111 Encounter for antineoplastic chemotherapy: Secondary | ICD-10-CM

## 2016-04-28 DIAGNOSIS — R439 Unspecified disturbances of smell and taste: Secondary | ICD-10-CM | POA: Diagnosis not present

## 2016-04-28 DIAGNOSIS — C50411 Malignant neoplasm of upper-outer quadrant of right female breast: Secondary | ICD-10-CM | POA: Diagnosis present

## 2016-04-28 DIAGNOSIS — D6481 Anemia due to antineoplastic chemotherapy: Secondary | ICD-10-CM

## 2016-04-28 DIAGNOSIS — R110 Nausea: Secondary | ICD-10-CM | POA: Diagnosis not present

## 2016-04-28 LAB — CBC WITH DIFFERENTIAL/PLATELET
BASO%: 0.5 % (ref 0.0–2.0)
Basophils Absolute: 0 10*3/uL (ref 0.0–0.1)
EOS%: 0.4 % (ref 0.0–7.0)
Eosinophils Absolute: 0 10*3/uL (ref 0.0–0.5)
HCT: 23.9 % — ABNORMAL LOW (ref 34.8–46.6)
HGB: 8 g/dL — ABNORMAL LOW (ref 11.6–15.9)
LYMPH%: 29.3 % (ref 14.0–49.7)
MCH: 29.3 pg (ref 25.1–34.0)
MCHC: 33.5 g/dL (ref 31.5–36.0)
MCV: 87.4 fL (ref 79.5–101.0)
MONO#: 0.3 10*3/uL (ref 0.1–0.9)
MONO%: 10.6 % (ref 0.0–14.0)
NEUT#: 1.8 10*3/uL (ref 1.5–6.5)
NEUT%: 59.2 % (ref 38.4–76.8)
Platelets: 302 10*3/uL (ref 145–400)
RBC: 2.73 10*6/uL — ABNORMAL LOW (ref 3.70–5.45)
RDW: 18.6 % — ABNORMAL HIGH (ref 11.2–14.5)
WBC: 3.1 10*3/uL — ABNORMAL LOW (ref 3.9–10.3)
lymph#: 0.9 10*3/uL (ref 0.9–3.3)

## 2016-04-28 LAB — COMPREHENSIVE METABOLIC PANEL
ALT: 10 U/L (ref 0–55)
AST: 12 U/L (ref 5–34)
Albumin: 3.5 g/dL (ref 3.5–5.0)
Alkaline Phosphatase: 58 U/L (ref 40–150)
Anion Gap: 9 mEq/L (ref 3–11)
BUN: 11.2 mg/dL (ref 7.0–26.0)
CO2: 26 mEq/L (ref 22–29)
Calcium: 9.1 mg/dL (ref 8.4–10.4)
Chloride: 109 mEq/L (ref 98–109)
Creatinine: 0.7 mg/dL (ref 0.6–1.1)
EGFR: >90 mL/min/{1.73_m2} (ref >90)
Glucose: 110 mg/dl (ref 70–140)
Potassium: 3.8 mEq/L (ref 3.5–5.1)
Sodium: 144 mEq/L (ref 136–145)
Total Bilirubin: <0.3 mg/dL (ref 0.20–1.20)
Total Protein: 6.6 g/dL (ref 6.4–8.3)

## 2016-04-28 MED ORDER — HEPARIN SOD (PORK) LOCK FLUSH 100 UNIT/ML IV SOLN
500.0000 [IU] | Freq: Once | INTRAVENOUS | Status: AC | PRN
  Administered 2016-04-28: 500 [IU]
  Filled 2016-04-28: qty 5

## 2016-04-28 MED ORDER — SODIUM CHLORIDE 0.9% FLUSH
10.0000 mL | INTRAVENOUS | Status: DC | PRN
Start: 2016-04-28 — End: 2016-04-28
  Administered 2016-04-28: 10 mL
  Filled 2016-04-28: qty 10

## 2016-04-28 MED ORDER — PALONOSETRON HCL INJECTION 0.25 MG/5ML
INTRAVENOUS | Status: AC
  Filled 2016-04-28: qty 5

## 2016-04-28 MED ORDER — SODIUM CHLORIDE 0.9 % IV SOLN
Freq: Once | INTRAVENOUS | Status: AC
  Administered 2016-04-28: 09:00:00 via INTRAVENOUS

## 2016-04-28 MED ORDER — PALONOSETRON HCL INJECTION 0.25 MG/5ML
0.2500 mg | Freq: Once | INTRAVENOUS | Status: AC
  Administered 2016-04-28: 0.25 mg via INTRAVENOUS

## 2016-04-28 MED ORDER — PACLITAXEL PROTEIN-BOUND CHEMO INJECTION 100 MG
65.0000 mg/m2 | Freq: Once | INTRAVENOUS | Status: AC
  Administered 2016-04-28: 125 mg via INTRAVENOUS
  Filled 2016-04-28: qty 25

## 2016-04-28 NOTE — Telephone Encounter (Signed)
FYI Call received from "Alison [name] with [facility] asking the doctor to send an order for depends as a  Safety precaution.  Alison [name] is not able to hold her body waste or make it to the bathroom during the day.  Started two weeks ago.  Bowels are loose.  She eats and drinks well. No fever, no c/o pain, burning with urination.  She isn't going to church, Alison [name]'s house or day outings because of this.  She doesn't wet/mess the bed at night.  It's during the day time."   This nurse advised she call PCP as [facility] does not support use of these devices as skin being moist could lead to skin breakdown.  Alison [name] called PCP, received order and made note for staff to check her to ensure she's changing the depends.

## 2016-04-28 NOTE — Assessment & Plan Note (Signed)
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 Adjuvant chemotherapy (Adriamycin and Cytoxan dose dense 4 followed by Abraxane weekly 12) 2. Followed by radiation  -------------------------------------------------------------------------------------------------------------------------- Current Treatment: Completed 4 cycles of Dose dense Adriamycin and Cytoxan ; today is cycle 3/12 Abraxane BRCA2: Recommended Bil Salpingo oophorectomy ECHO 02/08/16: EF 55%  Chemotherapy toxicities: 1. Profound nausea: I will give her IV fluids with Aloxi today. 2. Anemia due to chemotherapy 3. Decreased appetite and taste 4. Alopecia  I discussed with her that since she completed 4 cycles of Adriamycin and Cytoxan, she may be less nauseated with Abraxane treatments.  We are monitoring her closely for toxicities. RTC in 2 week for cycle 5 Abraxane

## 2016-04-28 NOTE — Progress Notes (Signed)
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 She tells me that she had been gagging most of the week. She continues to have nausea issues. She also has mild to moderate fatigue. Her mom tells me that she eats junk food all the time.  REVIEW OF SYSTEMS:   Constitutional: Denies fevers, chills or abnormal weight loss Eyes: Denies blurriness of vision Ears, nose, mouth, throat, and face: Denies mucositis or sore throat Respiratory: Denies cough, dyspnea or wheezes Cardiovascular: Denies palpitation, chest discomfort Gastrointestinal:  Complains of nausea Skin: Denies abnormal skin rashes Lymphatics: Denies new lymphadenopathy or easy bruising Neurological:Denies numbness, tingling or new weaknesses Behavioral/Psych: Mood is stable, no new changes    Extremities: No lower extremity edema Breast:  denies any pain or lumps or nodules in either breasts All other systems were reviewed with the patient and are negative.  I have reviewed the past medical history, past surgical history, social history and family history with the patient and they are unchanged from previous note.  ALLERGIES:  has No Known Allergies.  MEDICATIONS:  Current Outpatient Prescriptions  Medication Sig Dispense Refill  . acetaminophen (TYLENOL) 325 MG tablet Take 650 mg by mouth every 4 (four) hours as needed for mild pain.     . benztropine (COGENTIN) 0.5 MG tablet Take 0.5 mg by mouth 2 (two) times daily.    . Cyclophosphamide (CYTOXAN IJ) Inject as directed every 14 (fourteen) days.    . divalproex (DEPAKOTE ER) 500 MG 24 hr tablet Take 1,000 mg by mouth at bedtime.    Marland Kitchen DOXOrubicin HCl (ADRIAMYCIN IV) Inject into the vein every 14 (fourteen) days.    . furosemide (LASIX) 40 MG tablet Take 40 mg by mouth every morning.    Marland Kitchen lisinopril (PRINIVIL,ZESTRIL) 10 MG tablet Take 10 mg by mouth every morning.     . metFORMIN (GLUCOPHAGE) 500 MG tablet Take by mouth 2 (two) times daily with a meal.    . OLANZapine (ZYPREXA) 10 MG tablet Take 10 mg by mouth at bedtime.    Marland Kitchen omeprazole (PRILOSEC) 20 MG capsule Take 40 mg by mouth every morning.    Marland Kitchen oxyCODONE-acetaminophen (ROXICET) 5-325 MG tablet Take 1-2 tablets by mouth every 4 (four) hours as needed for moderate pain or severe pain. 30 tablet 0  . Polyethyl Glycol-Propyl Glycol (SYSTANE) 0.4-0.3 % SOLN Apply 1 drop to eye  4 (four) times daily.    . simvastatin (ZOCOR) 10 MG tablet Take 10 mg by mouth at bedtime.     No current facility-administered medications for this visit.   Facility-Administered Medications Ordered in Other Visits  Medication Dose Route Frequency Provider Last Rate Last Dose  . 0.9 %  sodium chloride infusion   Intravenous Once Nicholas Lose, MD      . sodium chloride flush (NS) 0.9 % injection  10 mL  10 mL Intracatheter PRN Nicholas Lose, MD   10 mL at 04/14/16 1127    PHYSICAL EXAMINATION: ECOG PERFORMANCE STATUS: 1 - Symptomatic but completely ambulatory  Filed Vitals:   04/28/16 0830  BP: 109/88  Pulse: 114  Temp: 98.5 F (36.9 C)  Resp: 18   Filed Weights   04/28/16 0830  Weight: 165 lb 11.2 oz (75.161 kg)    GENERAL:alert, no distress and comfortable SKIN: skin color, texture, turgor are normal, no rashes or significant lesions EYES: normal, Conjunctiva are pink and non-injected, sclera clear OROPHARYNX:no exudate, no erythema and lips, buccal mucosa, and tongue normal  NECK: supple, thyroid normal size, non-tender, without nodularity LYMPH:  no palpable lymphadenopathy in the cervical, axillary or inguinal LUNGS: clear to auscultation and percussion with normal breathing effort HEART: regular rate & rhythm and no murmurs and no lower extremity edema ABDOMEN:abdomen soft, non-tender and normal bowel sounds MUSCULOSKELETAL:no cyanosis of digits and no clubbing  NEURO: alert & oriented x 3 with fluent speech, no focal motor/sensory deficits EXTREMITIES: No lower extremity edema  LABORATORY DATA:  I have reviewed the data as listed   Chemistry      Component Value Date/Time   NA 142 04/21/2016 0831   NA 145 01/14/2016 1420   K 3.7 04/21/2016 0831   K 4.3 01/14/2016 1420   CL 102 01/14/2016 1420   CO2 26 04/21/2016 0831   CO2 28 01/14/2016 1420   BUN 11.5 04/21/2016 0831   BUN 7 01/14/2016 1420   CREATININE 0.8 04/21/2016 0831   CREATININE 0.77 01/14/2016 1420      Component Value Date/Time   CALCIUM 9.0 04/21/2016 0831   CALCIUM 9.2 01/14/2016 1420   ALKPHOS 73 04/21/2016 0831   ALKPHOS 76 04/17/2014 0345   AST 11 04/21/2016 0831   AST 59* 04/17/2014 0345   ALT 11 04/21/2016 0831   ALT 66* 04/17/2014 0345   BILITOT <0.30 04/21/2016 0831   BILITOT <0.2* 04/17/2014 0345       Lab Results  Component Value Date   WBC 3.1* 04/28/2016   HGB  8.0* 04/28/2016   HCT 23.9* 04/28/2016   MCV 87.4 04/28/2016   PLT 302 04/28/2016   NEUTROABS 1.8 04/28/2016   ASSESSMENT & PLAN:  Breast cancer of upper-outer quadrant of right female breast ([place]) Rt Lumpectomy 01/20/16: 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 Adjuvant chemotherapy (Adriamycin and Cytoxan dose dense 4 followed by Abraxane weekly 12) 2. Followed by radiation  -------------------------------------------------------------------------------------------------------------------------- Current Treatment: Completed 4 cycles of Dose dense Adriamycin and Cytoxan ; today is cycle 3/12 Abraxane  BRCA2: Recommended Bil Salpingo oophorectomy ECHO 02/08/16: EF 55%  Chemotherapy toxicities: 1. Profound nausea: Adding Aloxi to chemotherapy 2. Anemia due to chemotherapy: Decreasing the dose of Abraxane from cycle 3-65 mg/m 3. Decreased appetite and taste 4. Alopecia   We are monitoring her closely for toxicities. RTC in 2 week for cycle 5 Abraxane   No orders of the defined types were placed in this encounter.   The patient has a good understanding of the overall plan. she agrees with it. she will call with any problems that may develop before the next visit here.   Rulon Eisenmenger, MD 04/28/2016

## 2016-04-28 NOTE — Patient Instructions (Signed)

## 2016-04-28 NOTE — Telephone Encounter (Signed)
appt made and avs printed °

## 2016-04-28 NOTE — Telephone Encounter (Signed)
pt cld to get sch for next appt-adv it was 5/25'@8'$ :45

## 2016-05-05 ENCOUNTER — Ambulatory Visit (HOSPITAL_BASED_OUTPATIENT_CLINIC_OR_DEPARTMENT_OTHER): Payer: Medicaid Other

## 2016-05-05 ENCOUNTER — Other Ambulatory Visit (HOSPITAL_BASED_OUTPATIENT_CLINIC_OR_DEPARTMENT_OTHER): Payer: Medicaid Other

## 2016-05-05 VITALS — BP 114/64 | HR 84 | Temp 98.2°F | Resp 17

## 2016-05-05 DIAGNOSIS — C50411 Malignant neoplasm of upper-outer quadrant of right female breast: Secondary | ICD-10-CM | POA: Diagnosis not present

## 2016-05-05 DIAGNOSIS — Z5111 Encounter for antineoplastic chemotherapy: Secondary | ICD-10-CM

## 2016-05-05 LAB — COMPREHENSIVE METABOLIC PANEL
ALT: <9 U/L (ref 0–55)
AST: 10 U/L (ref 5–34)
Albumin: 3.6 g/dL (ref 3.5–5.0)
Alkaline Phosphatase: 68 U/L (ref 40–150)
Anion Gap: 11 mEq/L (ref 3–11)
BUN: 13.3 mg/dL (ref 7.0–26.0)
CO2: 27 mEq/L (ref 22–29)
Calcium: 9 mg/dL (ref 8.4–10.4)
Chloride: 105 mEq/L (ref 98–109)
Creatinine: 0.8 mg/dL (ref 0.6–1.1)
EGFR: >90 mL/min/{1.73_m2} (ref >90)
Glucose: 171 mg/dl — ABNORMAL HIGH (ref 70–140)
Potassium: 3.9 mEq/L (ref 3.5–5.1)
Sodium: 142 mEq/L (ref 136–145)
Total Bilirubin: <0.3 mg/dL (ref 0.20–1.20)
Total Protein: 6.9 g/dL (ref 6.4–8.3)

## 2016-05-05 LAB — CBC WITH DIFFERENTIAL/PLATELET
BASO%: 0.8 % (ref 0.0–2.0)
Basophils Absolute: 0 10*3/uL (ref 0.0–0.1)
EOS%: 0.4 % (ref 0.0–7.0)
Eosinophils Absolute: 0 10*3/uL (ref 0.0–0.5)
HCT: 26.7 % — ABNORMAL LOW (ref 34.8–46.6)
HGB: 8.6 g/dL — ABNORMAL LOW (ref 11.6–15.9)
LYMPH%: 31.1 % (ref 14.0–49.7)
MCH: 29.1 pg (ref 25.1–34.0)
MCHC: 32.2 g/dL (ref 31.5–36.0)
MCV: 90.2 fL (ref 79.5–101.0)
MONO#: 0.3 10*3/uL (ref 0.1–0.9)
MONO%: 10 % (ref 0.0–14.0)
NEUT#: 1.5 10*3/uL (ref 1.5–6.5)
NEUT%: 57.7 % (ref 38.4–76.8)
Platelets: 230 10*3/uL (ref 145–400)
RBC: 2.96 10*6/uL — ABNORMAL LOW (ref 3.70–5.45)
RDW: 17.8 % — ABNORMAL HIGH (ref 11.2–14.5)
WBC: 2.5 10*3/uL — ABNORMAL LOW (ref 3.9–10.3)
lymph#: 0.8 10*3/uL — ABNORMAL LOW (ref 0.9–3.3)

## 2016-05-05 MED ORDER — PACLITAXEL PROTEIN-BOUND CHEMO INJECTION 100 MG
65.0000 mg/m2 | Freq: Once | INTRAVENOUS | Status: AC
  Administered 2016-05-05: 125 mg via INTRAVENOUS
  Filled 2016-05-05: qty 25

## 2016-05-05 MED ORDER — SODIUM CHLORIDE 0.9% FLUSH
10.0000 mL | INTRAVENOUS | Status: DC | PRN
  Administered 2016-05-05: 10 mL
  Filled 2016-05-05: qty 10

## 2016-05-05 MED ORDER — PALONOSETRON HCL INJECTION 0.25 MG/5ML
INTRAVENOUS | Status: AC
  Filled 2016-05-05: qty 5

## 2016-05-05 MED ORDER — PALONOSETRON HCL INJECTION 0.25 MG/5ML
0.2500 mg | Freq: Once | INTRAVENOUS | Status: AC
  Administered 2016-05-05: 0.25 mg via INTRAVENOUS

## 2016-05-05 MED ORDER — SODIUM CHLORIDE 0.9 % IV SOLN
Freq: Once | INTRAVENOUS | Status: AC
  Administered 2016-05-05: 10:00:00 via INTRAVENOUS

## 2016-05-05 MED ORDER — HEPARIN SOD (PORK) LOCK FLUSH 100 UNIT/ML IV SOLN
500.0000 [IU] | Freq: Once | INTRAVENOUS | Status: AC | PRN
  Administered 2016-05-05: 500 [IU]
  Filled 2016-05-05: qty 5

## 2016-05-05 NOTE — Progress Notes (Signed)
Cbc and cmet reviewed with Dr Lindi Adie, ok to treat

## 2016-05-05 NOTE — Patient Instructions (Signed)

## 2016-05-12 ENCOUNTER — Other Ambulatory Visit (HOSPITAL_BASED_OUTPATIENT_CLINIC_OR_DEPARTMENT_OTHER): Payer: Medicaid Other

## 2016-05-12 ENCOUNTER — Telehealth: Payer: Self-pay | Admitting: Hematology and Oncology

## 2016-05-12 ENCOUNTER — Encounter: Payer: Self-pay | Admitting: Hematology and Oncology

## 2016-05-12 ENCOUNTER — Ambulatory Visit: Payer: Medicaid Other

## 2016-05-12 ENCOUNTER — Encounter: Payer: Self-pay | Admitting: *Deleted

## 2016-05-12 ENCOUNTER — Ambulatory Visit (HOSPITAL_BASED_OUTPATIENT_CLINIC_OR_DEPARTMENT_OTHER): Payer: Medicaid Other

## 2016-05-12 ENCOUNTER — Ambulatory Visit (HOSPITAL_BASED_OUTPATIENT_CLINIC_OR_DEPARTMENT_OTHER): Payer: Medicaid Other | Admitting: Hematology and Oncology

## 2016-05-12 VITALS — BP 105/82 | HR 107 | Temp 98.3°F | Resp 18 | Wt 163.8 lb

## 2016-05-12 VITALS — HR 88

## 2016-05-12 DIAGNOSIS — C50411 Malignant neoplasm of upper-outer quadrant of right female breast: Secondary | ICD-10-CM

## 2016-05-12 DIAGNOSIS — Z5111 Encounter for antineoplastic chemotherapy: Secondary | ICD-10-CM

## 2016-05-12 DIAGNOSIS — C773 Secondary and unspecified malignant neoplasm of axilla and upper limb lymph nodes: Secondary | ICD-10-CM

## 2016-05-12 DIAGNOSIS — D6481 Anemia due to antineoplastic chemotherapy: Secondary | ICD-10-CM

## 2016-05-12 LAB — CBC WITH DIFFERENTIAL/PLATELET
BASO%: 0.7 % (ref 0.0–2.0)
Basophils Absolute: 0 10*3/uL (ref 0.0–0.1)
EOS%: 0.8 % (ref 0.0–7.0)
Eosinophils Absolute: 0 10*3/uL (ref 0.0–0.5)
HCT: 29.6 % — ABNORMAL LOW (ref 34.8–46.6)
HGB: 10 g/dL — ABNORMAL LOW (ref 11.6–15.9)
LYMPH%: 37.7 % (ref 14.0–49.7)
MCH: 30 pg (ref 25.1–34.0)
MCHC: 33.6 g/dL (ref 31.5–36.0)
MCV: 89.4 fL (ref 79.5–101.0)
MONO#: 0.2 10*3/uL (ref 0.1–0.9)
MONO%: 6.9 % (ref 0.0–14.0)
NEUT#: 1.7 10*3/uL (ref 1.5–6.5)
NEUT%: 53.9 % (ref 38.4–76.8)
Platelets: 251 10*3/uL (ref 145–400)
RBC: 3.31 10*6/uL — ABNORMAL LOW (ref 3.70–5.45)
RDW: 18.1 % — ABNORMAL HIGH (ref 11.2–14.5)
WBC: 3.2 10*3/uL — ABNORMAL LOW (ref 3.9–10.3)
lymph#: 1.2 10*3/uL (ref 0.9–3.3)

## 2016-05-12 LAB — COMPREHENSIVE METABOLIC PANEL
ALT: 9 U/L (ref 0–55)
AST: 12 U/L (ref 5–34)
Albumin: 3.9 g/dL (ref 3.5–5.0)
Alkaline Phosphatase: 73 U/L (ref 40–150)
Anion Gap: 9 mEq/L (ref 3–11)
BUN: 11.1 mg/dL (ref 7.0–26.0)
CO2: 29 mEq/L (ref 22–29)
Calcium: 9.5 mg/dL (ref 8.4–10.4)
Chloride: 106 mEq/L (ref 98–109)
Creatinine: 0.8 mg/dL (ref 0.6–1.1)
EGFR: >90 mL/min/{1.73_m2} (ref >90)
Glucose: 115 mg/dl (ref 70–140)
Potassium: 4 mEq/L (ref 3.5–5.1)
Sodium: 144 mEq/L (ref 136–145)
Total Bilirubin: 0.3 mg/dL (ref 0.20–1.20)
Total Protein: 7.4 g/dL (ref 6.4–8.3)

## 2016-05-12 MED ORDER — SODIUM CHLORIDE 0.9% FLUSH
10.0000 mL | INTRAVENOUS | Status: DC | PRN
  Administered 2016-05-12: 10 mL
  Filled 2016-05-12: qty 10

## 2016-05-12 MED ORDER — HEPARIN SOD (PORK) LOCK FLUSH 100 UNIT/ML IV SOLN
500.0000 [IU] | Freq: Once | INTRAVENOUS | Status: AC | PRN
  Administered 2016-05-12: 500 [IU]
  Filled 2016-05-12: qty 5

## 2016-05-12 MED ORDER — SODIUM CHLORIDE 0.9 % IV SOLN
Freq: Once | INTRAVENOUS | Status: AC
  Administered 2016-05-12: 14:00:00 via INTRAVENOUS

## 2016-05-12 MED ORDER — PALONOSETRON HCL INJECTION 0.25 MG/5ML
INTRAVENOUS | Status: AC
  Filled 2016-05-12: qty 5

## 2016-05-12 MED ORDER — PALONOSETRON HCL INJECTION 0.25 MG/5ML
0.2500 mg | Freq: Once | INTRAVENOUS | Status: AC
  Administered 2016-05-12: 0.25 mg via INTRAVENOUS

## 2016-05-12 MED ORDER — PACLITAXEL PROTEIN-BOUND CHEMO INJECTION 100 MG
65.0000 mg/m2 | Freq: Once | INTRAVENOUS | Status: AC
  Administered 2016-05-12: 125 mg via INTRAVENOUS
  Filled 2016-05-12: qty 25

## 2016-05-12 NOTE — Assessment & Plan Note (Signed)
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 Adjuvant chemotherapy (Adriamycin and Cytoxan dose dense 4 followed by Abraxane weekly 12) 2. Followed by radiation  -------------------------------------------------------------------------------------------------------------------------- Current Treatment: Completed 4 cycles of Dose dense Adriamycin and Cytoxan ; today is cycle 5/12 Abraxane BRCA2: Recommended Bil Salpingo oophorectomy ECHO 02/08/16: EF 55%  Chemotherapy toxicities: 1. Profound nausea: Adding Aloxi to chemotherapy 2. Anemia due to chemotherapy: Decreasing the dose of Abraxane from cycle 3-65 mg/m 3. Decreased appetite and taste 4. Alopecia  We are monitoring her closely for toxicities. RTC in 2 week for cycle 7 Abraxane

## 2016-05-12 NOTE — Progress Notes (Signed)
Patient Care Team: Iona Beard, MD as PCP - General (Family Medicine)  SUMMARY OF ONCOLOGIC HISTORY:   Breast cancer of upper-outer quadrant of right female breast ([place])   11/25/2015 Mammogram Right breast mass 12 cm from the nipple: 1.4 x 1.3 x 0.9 cm, two right axillary lymph nodes with borderline cortical thickening measuring up to 3 mm   12/04/2015 Initial Diagnosis Right breast biopsy 10:00: Invasive ductal carcinoma with DCIS, grade 2, ER 0%, PR 0%, HER-2 negative, ratio1.31, Ki-67 40%   01/13/2016 Procedure 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   01/20/2016 Surgery Rt Lumpectomy: IDC 1.7 cm, grade 3, with DCIS, 0/2 LN, ER 0%, PR 0%, Her 2 Neg Ratio 1.35, Ki 67: 40%, T1CN0 (stage 1A)   02/11/2016 -  Chemotherapy Adjuvant chemotherapy with dose dense Adriamycin and Cytoxan 4 followed by Abraxane weekly 12    CHIEF COMPLIANT: Cycle 5 Abraxane  INTERVAL HISTORY: Alison [name] is a 55 year old with above-mentioned history right breast cancer currently on adjuvant chemotherapy with Abraxane. Today's cycle 5. She appears to be tolerating it much better. Since we change her antiemetics Aloxi she has not had any nausea or vomiting. She is eating well. Denies any neuropathy. She tells me that she is going to school and is learning how to fill job applications.  REVIEW OF SYSTEMS:   Constitutional: Denies fevers, chills or abnormal weight loss Eyes: Denies blurriness of vision Ears, nose, mouth, throat, and face: Denies mucositis or sore throat Respiratory: Denies cough, dyspnea or wheezes Cardiovascular: Denies palpitation, chest discomfort Gastrointestinal:  Denies nausea, heartburn or change in bowel habits Skin: Denies abnormal skin rashes Lymphatics: Denies new lymphadenopathy or easy bruising Neurological:Denies numbness, tingling or new  weaknesses Behavioral/Psych: Mood is stable, no new changes  Extremities: No lower extremity edema Ir All other systems were reviewed with the patient and are negative.  I have reviewed the past medical history, past surgical history, social history and family history with the patient and they are unchanged from previous note.  ALLERGIES:  has No Known Allergies.  MEDICATIONS:  Current Outpatient Prescriptions  Medication Sig Dispense Refill  . acetaminophen (TYLENOL) 325 MG tablet Take 650 mg by mouth every 4 (four) hours as needed for mild pain.     . benztropine (COGENTIN) 0.5 MG tablet Take 0.5 mg by mouth 2 (two) times daily.    . Cyclophosphamide (CYTOXAN IJ) Inject as directed every 14 (fourteen) days.    . divalproex (DEPAKOTE ER) 500 MG 24 hr tablet Take 1,000 mg by mouth at bedtime.    Marland Kitchen DOXOrubicin HCl (ADRIAMYCIN IV) Inject into the vein every 14 (fourteen) days.    . furosemide (LASIX) 40 MG tablet Take 40 mg by mouth every morning.    Marland Kitchen lisinopril (PRINIVIL,ZESTRIL) 10 MG tablet Take 10 mg by mouth every morning.     . metFORMIN (GLUCOPHAGE) 500 MG tablet Take by mouth 2 (two) times daily with a meal.    . OLANZapine (ZYPREXA) 10 MG tablet Take 10 mg by mouth at bedtime.    Marland Kitchen omeprazole (PRILOSEC) 20 MG capsule Take 40 mg by mouth every morning.    Marland Kitchen oxyCODONE-acetaminophen (ROXICET) 5-325 MG tablet Take 1-2 tablets by mouth every 4 (four) hours as needed for moderate pain or severe pain. 30 tablet 0  . Polyethyl Glycol-Propyl Glycol (SYSTANE) 0.4-0.3 % SOLN Apply 1 drop to eye 4 (four) times daily.    Marland Kitchen  simvastatin (ZOCOR) 10 MG tablet Take 10 mg by mouth at bedtime.     No current facility-administered medications for this visit.   Facility-Administered Medications Ordered in Other Visits  Medication Dose Route Frequency Provider Last Rate Last Dose  . 0.9 %  sodium chloride infusion   Intravenous Once Nicholas Lose, MD      . sodium chloride flush (NS) 0.9 % injection  10 mL  10 mL Intracatheter PRN Nicholas Lose, MD   10 mL at 04/14/16 1127    PHYSICAL EXAMINATION: ECOG PERFORMANCE STATUS: 1 - Symptomatic but completely ambulatory  Filed Vitals:   05/12/16 1121  BP: 105/82  Pulse: 107  Temp: 98.3 F (36.8 C)  Resp: 18   Filed Weights   05/12/16 1121  Weight: 163 lb 12.8 oz (74.299 kg)    GENERAL:alert, no distress and comfortable SKIN: skin color, texture, turgor are normal, no rashes or significant lesions EYES: normal, Conjunctiva are pink and non-injected, sclera clear OROPHARYNX:no exudate, no erythema and lips, buccal mucosa, and tongue normal  NECK: supple, thyroid normal size, non-tender, without nodularity LYMPH:  no palpable lymphadenopathy in the cervical, axillary or inguinal LUNGS: clear to auscultation and percussion with normal breathing effort HEART: regular rate & rhythm and no murmurs and no lower extremity edema ABDOMEN:abdomen soft, non-tender and normal bowel sounds MUSCULOSKELETAL:no cyanosis of digits and no clubbing  NEURO: alert & oriented x 3 with fluent speech, no focal motor/sensory deficits EXTREMITIES: No lower extremity edema  LABORATORY DATA:  I have reviewed the data as listed   Chemistry      Component Value Date/Time   NA 142 05/05/2016 0908   NA 145 01/14/2016 1420   K 3.9 05/05/2016 0908   K 4.3 01/14/2016 1420   CL 102 01/14/2016 1420   CO2 27 05/05/2016 0908   CO2 28 01/14/2016 1420   BUN 13.3 05/05/2016 0908   BUN 7 01/14/2016 1420   CREATININE 0.8 05/05/2016 0908   CREATININE 0.77 01/14/2016 1420      Component Value Date/Time   CALCIUM 9.0 05/05/2016 0908   CALCIUM 9.2 01/14/2016 1420   ALKPHOS 68 05/05/2016 0908   ALKPHOS 76 04/17/2014 0345   AST 10 05/05/2016 0908   AST 59* 04/17/2014 0345   ALT <9 05/05/2016 0908   ALT 66* 04/17/2014 0345   BILITOT <0.30 05/05/2016 0908   BILITOT <0.2* 04/17/2014 0345       Lab Results  Component Value Date   WBC 3.2* 05/12/2016   HGB  10.0* 05/12/2016   HCT 29.6* 05/12/2016   MCV 89.4 05/12/2016   PLT 251 05/12/2016   NEUTROABS 1.7 05/12/2016     ASSESSMENT & PLAN:  Breast cancer of upper-outer quadrant of right female breast ([place]) Rt Lumpectomy 01/20/16: 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 Adjuvant chemotherapy (Adriamycin and Cytoxan dose dense 4 followed by Abraxane weekly 12) 2. Followed by radiation  -------------------------------------------------------------------------------------------------------------------------- Current Treatment: Completed 4 cycles of Dose dense Adriamycin and Cytoxan ; today is cycle 5/12 Abraxane BRCA2: Recommended Bil Salpingo oophorectomy ECHO  02/08/16: EF 55%  Chemotherapy toxicities: 1. Profound nausea: Aloxi significantly improved her symptoms. 2. Anemia due to chemotherapy: Decreasing the dose of Abraxane from cycle 3 to 65 mg/m 3. Decreased appetite and taste 4. Alopecia  We are monitoring her closely for toxicities. RTC in 2 week for cycle 7 Abraxane   No orders of the defined types were placed in this encounter.   The patient has a good understanding of the overall plan. she agrees with it. she will call with any problems that may develop before the next visit here.   Rulon Eisenmenger, MD 05/12/2016

## 2016-05-12 NOTE — Telephone Encounter (Signed)
appt made and avs printed °

## 2016-05-12 NOTE — Patient Instructions (Signed)
[facility] Discharge Instructions for Patients Receiving Chemotherapy  Today you received the following chemotherapy agents Abraxane To help prevent nausea and vomiting after your treatment, we encourage you to take your nausea medication as prescribed.   If you develop nausea and vomiting that is not controlled by your nausea medication, call the clinic.   BELOW ARE SYMPTOMS THAT SHOULD BE REPORTED IMMEDIATELY:  *FEVER GREATER THAN 100.5 F  *CHILLS WITH OR WITHOUT FEVER  NAUSEA AND VOMITING THAT IS NOT CONTROLLED WITH YOUR NAUSEA MEDICATION  *UNUSUAL SHORTNESS OF BREATH  *UNUSUAL BRUISING OR BLEEDING  TENDERNESS IN MOUTH AND THROAT WITH OR WITHOUT PRESENCE OF ULCERS  *URINARY PROBLEMS  *BOWEL PROBLEMS  UNUSUAL RASH Items with * indicate a potential emergency and should be followed up as soon as possible.  Feel free to call the clinic you have any questions or concerns. The clinic phone number is [phone].  Please show the CHEMO ALERT CARD at check-in to the Emergency Department and triage nurse.   

## 2016-05-19 ENCOUNTER — Ambulatory Visit (HOSPITAL_BASED_OUTPATIENT_CLINIC_OR_DEPARTMENT_OTHER): Payer: Medicaid Other

## 2016-05-19 ENCOUNTER — Other Ambulatory Visit (HOSPITAL_BASED_OUTPATIENT_CLINIC_OR_DEPARTMENT_OTHER): Payer: Medicaid Other

## 2016-05-19 VITALS — BP 110/79 | HR 96 | Temp 98.5°F | Resp 20

## 2016-05-19 DIAGNOSIS — Z5111 Encounter for antineoplastic chemotherapy: Secondary | ICD-10-CM | POA: Diagnosis present

## 2016-05-19 DIAGNOSIS — C50411 Malignant neoplasm of upper-outer quadrant of right female breast: Secondary | ICD-10-CM

## 2016-05-19 LAB — CBC WITH DIFFERENTIAL/PLATELET
BASO%: 0.4 % (ref 0.0–2.0)
Basophils Absolute: 0 10*3/uL (ref 0.0–0.1)
EOS%: 1.2 % (ref 0.0–7.0)
Eosinophils Absolute: 0 10*3/uL (ref 0.0–0.5)
HCT: 27 % — ABNORMAL LOW (ref 34.8–46.6)
HGB: 8.9 g/dL — ABNORMAL LOW (ref 11.6–15.9)
LYMPH%: 37.5 % (ref 14.0–49.7)
MCH: 29.7 pg (ref 25.1–34.0)
MCHC: 33 g/dL (ref 31.5–36.0)
MCV: 90 fL (ref 79.5–101.0)
MONO#: 0.2 10*3/uL (ref 0.1–0.9)
MONO%: 6.6 % (ref 0.0–14.0)
NEUT#: 1.4 10*3/uL — ABNORMAL LOW (ref 1.5–6.5)
NEUT%: 54.3 % (ref 38.4–76.8)
Platelets: 189 10*3/uL (ref 145–400)
RBC: 3 10*6/uL — ABNORMAL LOW (ref 3.70–5.45)
RDW: 15.6 % — ABNORMAL HIGH (ref 11.2–14.5)
WBC: 2.6 10*3/uL — ABNORMAL LOW (ref 3.9–10.3)
lymph#: 1 10*3/uL (ref 0.9–3.3)

## 2016-05-19 LAB — COMPREHENSIVE METABOLIC PANEL
ALT: 9 U/L (ref 0–55)
AST: 13 U/L (ref 5–34)
Albumin: 3.6 g/dL (ref 3.5–5.0)
Alkaline Phosphatase: 61 U/L (ref 40–150)
Anion Gap: 10 mEq/L (ref 3–11)
BUN: 12.1 mg/dL (ref 7.0–26.0)
CO2: 25 mEq/L (ref 22–29)
Calcium: 9.2 mg/dL (ref 8.4–10.4)
Chloride: 107 mEq/L (ref 98–109)
Creatinine: 0.7 mg/dL (ref 0.6–1.1)
EGFR: >90 mL/min/{1.73_m2} (ref >90)
Glucose: 104 mg/dl (ref 70–140)
Potassium: 3.7 mEq/L (ref 3.5–5.1)
Sodium: 142 mEq/L (ref 136–145)
Total Bilirubin: <0.3 mg/dL (ref 0.20–1.20)
Total Protein: 6.9 g/dL (ref 6.4–8.3)

## 2016-05-19 MED ORDER — PALONOSETRON HCL INJECTION 0.25 MG/5ML
0.2500 mg | Freq: Once | INTRAVENOUS | Status: AC
  Administered 2016-05-19: 0.25 mg via INTRAVENOUS

## 2016-05-19 MED ORDER — PACLITAXEL PROTEIN-BOUND CHEMO INJECTION 100 MG
65.0000 mg/m2 | Freq: Once | INTRAVENOUS | Status: AC
  Administered 2016-05-19: 125 mg via INTRAVENOUS
  Filled 2016-05-19: qty 25

## 2016-05-19 MED ORDER — SODIUM CHLORIDE 0.9 % IV SOLN
Freq: Once | INTRAVENOUS | Status: AC
  Administered 2016-05-19: 10:00:00 via INTRAVENOUS

## 2016-05-19 MED ORDER — HEPARIN SOD (PORK) LOCK FLUSH 100 UNIT/ML IV SOLN
500.0000 [IU] | Freq: Once | INTRAVENOUS | Status: AC | PRN
  Administered 2016-05-19: 500 [IU]
  Filled 2016-05-19: qty 5

## 2016-05-19 MED ORDER — SODIUM CHLORIDE 0.9% FLUSH
10.0000 mL | INTRAVENOUS | Status: DC | PRN
  Administered 2016-05-19: 10 mL
  Filled 2016-05-19: qty 10

## 2016-05-19 MED ORDER — PALONOSETRON HCL INJECTION 0.25 MG/5ML
INTRAVENOUS | Status: AC
  Filled 2016-05-19: qty 5

## 2016-05-19 NOTE — Progress Notes (Signed)
Okay to treat with ANC 1.4 per Dr. Lindi Adie.

## 2016-05-19 NOTE — Patient Instructions (Signed)
[facility] Discharge Instructions for Patients Receiving Chemotherapy  Today you received the following chemotherapy agents Abraxane To help prevent nausea and vomiting after your treatment, we encourage you to take your nausea medication as prescribed.   If you develop nausea and vomiting that is not controlled by your nausea medication, call the clinic.   BELOW ARE SYMPTOMS THAT SHOULD BE REPORTED IMMEDIATELY:  *FEVER GREATER THAN 100.5 F  *CHILLS WITH OR WITHOUT FEVER  NAUSEA AND VOMITING THAT IS NOT CONTROLLED WITH YOUR NAUSEA MEDICATION  *UNUSUAL SHORTNESS OF BREATH  *UNUSUAL BRUISING OR BLEEDING  TENDERNESS IN MOUTH AND THROAT WITH OR WITHOUT PRESENCE OF ULCERS  *URINARY PROBLEMS  *BOWEL PROBLEMS  UNUSUAL RASH Items with * indicate a potential emergency and should be followed up as soon as possible.  Feel free to call the clinic you have any questions or concerns. The clinic phone number is [phone].  Please show the CHEMO ALERT CARD at check-in to the Emergency Department and triage nurse.   

## 2016-05-26 ENCOUNTER — Other Ambulatory Visit (HOSPITAL_BASED_OUTPATIENT_CLINIC_OR_DEPARTMENT_OTHER): Payer: Medicaid Other

## 2016-05-26 ENCOUNTER — Ambulatory Visit (HOSPITAL_BASED_OUTPATIENT_CLINIC_OR_DEPARTMENT_OTHER): Payer: Medicaid Other

## 2016-05-26 ENCOUNTER — Ambulatory Visit (HOSPITAL_BASED_OUTPATIENT_CLINIC_OR_DEPARTMENT_OTHER): Payer: Medicaid Other | Admitting: Hematology and Oncology

## 2016-05-26 ENCOUNTER — Ambulatory Visit: Payer: Medicaid Other

## 2016-05-26 ENCOUNTER — Encounter: Payer: Self-pay | Admitting: Hematology and Oncology

## 2016-05-26 VITALS — BP 118/70 | HR 102 | Temp 98.2°F | Resp 18 | Ht 60.0 in | Wt 165.5 lb

## 2016-05-26 DIAGNOSIS — C50411 Malignant neoplasm of upper-outer quadrant of right female breast: Secondary | ICD-10-CM

## 2016-05-26 DIAGNOSIS — Z5111 Encounter for antineoplastic chemotherapy: Secondary | ICD-10-CM | POA: Diagnosis present

## 2016-05-26 LAB — COMPREHENSIVE METABOLIC PANEL
ALT: <9 U/L (ref 0–55)
AST: 11 U/L (ref 5–34)
Albumin: 3.5 g/dL (ref 3.5–5.0)
Alkaline Phosphatase: 65 U/L (ref 40–150)
Anion Gap: 11 mEq/L (ref 3–11)
BUN: 13.9 mg/dL (ref 7.0–26.0)
CO2: 27 mEq/L (ref 22–29)
Calcium: 9 mg/dL (ref 8.4–10.4)
Chloride: 102 mEq/L (ref 98–109)
Creatinine: 0.8 mg/dL (ref 0.6–1.1)
EGFR: >90 mL/min/{1.73_m2} (ref >90)
Glucose: 147 mg/dl — ABNORMAL HIGH (ref 70–140)
Potassium: 3.6 mEq/L (ref 3.5–5.1)
Sodium: 140 mEq/L (ref 136–145)
Total Bilirubin: <0.3 mg/dL (ref 0.20–1.20)
Total Protein: 6.8 g/dL (ref 6.4–8.3)

## 2016-05-26 LAB — CBC WITH DIFFERENTIAL/PLATELET
BASO%: 0.5 % (ref 0.0–2.0)
Basophils Absolute: 0 10*3/uL (ref 0.0–0.1)
EOS%: 1.8 % (ref 0.0–7.0)
Eosinophils Absolute: 0 10*3/uL (ref 0.0–0.5)
HCT: 26.7 % — ABNORMAL LOW (ref 34.8–46.6)
HGB: 8.8 g/dL — ABNORMAL LOW (ref 11.6–15.9)
LYMPH%: 40.7 % (ref 14.0–49.7)
MCH: 29.8 pg (ref 25.1–34.0)
MCHC: 33 g/dL (ref 31.5–36.0)
MCV: 90.5 fL (ref 79.5–101.0)
MONO#: 0.1 10*3/uL (ref 0.1–0.9)
MONO%: 5.9 % (ref 0.0–14.0)
NEUT#: 1.1 10*3/uL — ABNORMAL LOW (ref 1.5–6.5)
NEUT%: 51.1 % (ref 38.4–76.8)
Platelets: 184 10*3/uL (ref 145–400)
RBC: 2.95 10*6/uL — ABNORMAL LOW (ref 3.70–5.45)
RDW: 15 % — ABNORMAL HIGH (ref 11.2–14.5)
WBC: 2.2 10*3/uL — ABNORMAL LOW (ref 3.9–10.3)
lymph#: 0.9 10*3/uL (ref 0.9–3.3)

## 2016-05-26 MED ORDER — PALONOSETRON HCL INJECTION 0.25 MG/5ML
INTRAVENOUS | Status: AC
  Filled 2016-05-26: qty 5

## 2016-05-26 MED ORDER — HEPARIN SOD (PORK) LOCK FLUSH 100 UNIT/ML IV SOLN
500.0000 [IU] | Freq: Once | INTRAVENOUS | Status: AC | PRN
  Administered 2016-05-26: 500 [IU]
  Filled 2016-05-26: qty 5

## 2016-05-26 MED ORDER — PACLITAXEL PROTEIN-BOUND CHEMO INJECTION 100 MG
55.0000 mg/m2 | Freq: Once | INTRAVENOUS | Status: AC
  Administered 2016-05-26: 100 mg via INTRAVENOUS
  Filled 2016-05-26: qty 20

## 2016-05-26 MED ORDER — PALONOSETRON HCL INJECTION 0.25 MG/5ML
0.2500 mg | Freq: Once | INTRAVENOUS | Status: AC
  Administered 2016-05-26: 0.25 mg via INTRAVENOUS

## 2016-05-26 MED ORDER — SODIUM CHLORIDE 0.9 % IV SOLN
Freq: Once | INTRAVENOUS | Status: AC
  Administered 2016-05-26: 10:00:00 via INTRAVENOUS

## 2016-05-26 MED ORDER — SODIUM CHLORIDE 0.9% FLUSH
10.0000 mL | INTRAVENOUS | Status: DC | PRN
  Administered 2016-05-26: 10 mL
  Filled 2016-05-26: qty 10

## 2016-05-26 NOTE — Patient Instructions (Signed)
[facility] Discharge Instructions for Patients Receiving Chemotherapy  Today you received the following chemotherapy agents:  Abraxane  To help prevent nausea and vomiting after your treatment, we encourage you to take your nausea medication as ordered per MD.   If you develop nausea and vomiting that is not controlled by your nausea medication, call the clinic.   BELOW ARE SYMPTOMS THAT SHOULD BE REPORTED IMMEDIATELY:  *FEVER GREATER THAN 100.5 F  *CHILLS WITH OR WITHOUT FEVER  NAUSEA AND VOMITING THAT IS NOT CONTROLLED WITH YOUR NAUSEA MEDICATION  *UNUSUAL SHORTNESS OF BREATH  *UNUSUAL BRUISING OR BLEEDING  TENDERNESS IN MOUTH AND THROAT WITH OR WITHOUT PRESENCE OF ULCERS  *URINARY PROBLEMS  *BOWEL PROBLEMS  UNUSUAL RASH Items with * indicate a potential emergency and should be followed up as soon as possible.  Feel free to call the clinic you have any questions or concerns. The clinic phone number is [phone].  Please show the CHEMO ALERT CARD at check-in to the Emergency Department and triage nurse.   

## 2016-05-26 NOTE — Progress Notes (Signed)
OK to treat with ANC-1.1 per Eulas Post RN per Dr. Lindi Adie.

## 2016-05-26 NOTE — Assessment & Plan Note (Signed)
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 Adjuvant chemotherapy (Adriamycin and Cytoxan dose dense 4 followed by Abraxane weekly 12) 2. Followed by radiation  -------------------------------------------------------------------------------------------------------------------------- Current Treatment: Completed 4 cycles of Dose dense Adriamycin and Cytoxan ; today is cycle 7/12 Abraxane BRCA2: Recommended Bil Salpingo oophorectomy ECHO 02/08/16: EF 55%  Chemotherapy toxicities: 1. Profound nausea: Aloxi significantly improved her symptoms. 2. Anemia due to chemotherapy: Decreasing the dose of Abraxane from cycle 3 to 65 mg/m 3. Decreased appetite and taste 4. Alopecia  We are monitoring her closely for toxicities. RTC in 2 week for cycle 9 Abraxane

## 2016-05-26 NOTE — Progress Notes (Signed)
Patient Care Team: Iona Beard, MD as PCP - General (Family Medicine)  SUMMARY OF ONCOLOGIC HISTORY:   Breast cancer of upper-outer quadrant of right female breast ([place])   11/25/2015 Mammogram Right breast mass 12 cm from the nipple: 1.4 x 1.3 x 0.9 cm, two right axillary lymph nodes with borderline cortical thickening measuring up to 3 mm   12/04/2015 Initial Diagnosis Right breast biopsy 10:00: Invasive ductal carcinoma with DCIS, grade 2, ER 0%, PR 0%, HER-2 negative, ratio1.31, Ki-67 40%   01/13/2016 Procedure 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   01/20/2016 Surgery Rt Lumpectomy: IDC 1.7 cm, grade 3, with DCIS, 0/2 LN, ER 0%, PR 0%, Her 2 Neg Ratio 1.35, Ki 67: 40%, T1CN0 (stage 1A)   02/11/2016 -  Chemotherapy Adjuvant chemotherapy with dose dense Adriamycin and Cytoxan 4 followed by Abraxane weekly 12    CHIEF COMPLIANT: Cycle 7 Abraxane  INTERVAL HISTORY: Alison [name] is a 55 year old with above-mentioned history of right breast cancer treated with lumpectomy and is currently in adjuvant chemotherapy with weekly Abraxane. She appears to be tolerating Abraxane fairly well. She has good appetite and eating well. Denies any constipation nausea vomiting. Labs reveal an [place] of 1100. Does not have any fevers or chills.  REVIEW OF SYSTEMS:   Constitutional: Denies fevers, chills or abnormal weight loss Eyes: Denies blurriness of vision Ears, nose, mouth, throat, and face: Denies mucositis or sore throat Respiratory: Denies cough, dyspnea or wheezes Cardiovascular: Denies palpitation, chest discomfort Gastrointestinal:  Denies nausea, heartburn or change in bowel habits Skin: Denies abnormal skin rashes Lymphatics: Denies new lymphadenopathy or easy bruising Neurological:Denies numbness, tingling or new weaknesses Behavioral/Psych: Mood  is stable, no new changes  Extremities: No lower extremity edema Breast:  denies any pain or lumps or nodules in either breasts All other systems were reviewed with the patient and are negative.  I have reviewed the past medical history, past surgical history, social history and family history with the patient and they are unchanged from previous note.  ALLERGIES:  has No Known Allergies.  MEDICATIONS:  Current Outpatient Prescriptions  Medication Sig Dispense Refill  . acetaminophen (TYLENOL) 325 MG tablet Take 650 mg by mouth every 4 (four) hours as needed for mild pain.     . benztropine (COGENTIN) 0.5 MG tablet Take 0.5 mg by mouth 2 (two) times daily.    . Cyclophosphamide (CYTOXAN IJ) Inject as directed every 14 (fourteen) days.    . divalproex (DEPAKOTE ER) 500 MG 24 hr tablet Take 1,000 mg by mouth at bedtime.    Marland Kitchen DOXOrubicin HCl (ADRIAMYCIN IV) Inject into the vein every 14 (fourteen) days.    . furosemide (LASIX) 40 MG tablet Take 40 mg by mouth every morning.    Marland Kitchen lisinopril (PRINIVIL,ZESTRIL) 10 MG tablet Take 10 mg by mouth every morning.     . metFORMIN (GLUCOPHAGE) 500 MG tablet Take by mouth 2 (two) times daily with a meal.    . OLANZapine (ZYPREXA) 10 MG tablet Take 10 mg by mouth at bedtime.    Marland Kitchen omeprazole (PRILOSEC) 20 MG capsule Take 40 mg by mouth every morning.    Marland Kitchen oxyCODONE-acetaminophen (ROXICET) 5-325 MG tablet Take 1-2 tablets by mouth every 4 (four) hours as needed for moderate pain or severe pain. 30 tablet 0  . Polyethyl Glycol-Propyl Glycol (SYSTANE) 0.4-0.3 % SOLN Apply 1 drop to eye 4 (four)  times daily.    . simvastatin (ZOCOR) 10 MG tablet Take 10 mg by mouth at bedtime.     No current facility-administered medications for this visit.   Facility-Administered Medications Ordered in Other Visits  Medication Dose Route Frequency Provider Last Rate Last Dose  . 0.9 %  sodium chloride infusion   Intravenous Once Nicholas Lose, MD      . sodium chloride  flush (NS) 0.9 % injection 10 mL  10 mL Intracatheter PRN Nicholas Lose, MD   10 mL at 04/14/16 1127    PHYSICAL EXAMINATION: ECOG PERFORMANCE STATUS: 0 - Asymptomatic  Filed Vitals:   05/26/16 0921  BP: 118/70  Pulse: 102  Temp: 98.2 F (36.8 C)  Resp: 18   Filed Weights   05/26/16 0921  Weight: 165 lb 8 oz (75.07 kg)    GENERAL:alert, no distress and comfortable SKIN: skin color, texture, turgor are normal, no rashes or significant lesions EYES: normal, Conjunctiva are pink and non-injected, sclera clear OROPHARYNX:no exudate, no erythema and lips, buccal mucosa, and tongue normal  NECK: supple, thyroid normal size, non-tender, without nodularity LYMPH:  no palpable lymphadenopathy in the cervical, axillary or inguinal LUNGS: clear to auscultation and percussion with normal breathing effort HEART: regular rate & rhythm and no murmurs and no lower extremity edema ABDOMEN:abdomen soft, non-tender and normal bowel sounds MUSCULOSKELETAL:no cyanosis of digits and no clubbing  NEURO: alert & oriented x 3 with fluent speech, no focal motor/sensory deficits EXTREMITIES: No lower extremity edema  LABORATORY DATA:  I have reviewed the data as listed   Chemistry      Component Value Date/Time   NA 142 05/19/2016 0843   NA 145 01/14/2016 1420   K 3.7 05/19/2016 0843   K 4.3 01/14/2016 1420   CL 102 01/14/2016 1420   CO2 25 05/19/2016 0843   CO2 28 01/14/2016 1420   BUN 12.1 05/19/2016 0843   BUN 7 01/14/2016 1420   CREATININE 0.7 05/19/2016 0843   CREATININE 0.77 01/14/2016 1420      Component Value Date/Time   CALCIUM 9.2 05/19/2016 0843   CALCIUM 9.2 01/14/2016 1420   ALKPHOS 61 05/19/2016 0843   ALKPHOS 76 04/17/2014 0345   AST 13 05/19/2016 0843   AST 59* 04/17/2014 0345   ALT 9 05/19/2016 0843   ALT 66* 04/17/2014 0345   BILITOT <0.30 05/19/2016 0843   BILITOT <0.2* 04/17/2014 0345       Lab Results  Component Value Date   WBC 2.2* 05/26/2016   HGB 8.8*  05/26/2016   HCT 26.7* 05/26/2016   MCV 90.5 05/26/2016   PLT 184 05/26/2016   NEUTROABS 1.1* 05/26/2016   ASSESSMENT & PLAN:  Breast cancer of upper-outer quadrant of right female breast ([place]) Rt Lumpectomy 01/20/16: 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 Adjuvant chemotherapy (Adriamycin and Cytoxan dose dense 4 followed by Abraxane weekly 12) 2. Followed by radiation  -------------------------------------------------------------------------------------------------------------------------- Current Treatment: Completed 4 cycles of Dose dense Adriamycin and Cytoxan ; today is cycle 7/12 Abraxane BRCA2: Recommended Bil Salpingo oophorectomy  ECHO 02/08/16: EF 55%  Chemotherapy toxicities: 1. Profound nausea: Aloxi significantly improved her symptoms. 2. Anemia due to chemotherapy: Decreasing the dose of Abraxane from cycle 3 to 65 mg/m 3. Decreased appetite and taste 4. Alopecia 5. Neutropenia: ANC 1100: Patient is okay to treat. I reduced the dosage of chemotherapy.  We are monitoring her closely for toxicities. RTC in 2 week for cycle 9 Abraxane   No orders of the defined types were placed in this encounter.   The patient has a good understanding of the overall plan. she agrees with it. she will call with any problems that may develop before the next visit here.   Rulon Eisenmenger, MD 05/26/2016

## 2016-06-02 ENCOUNTER — Other Ambulatory Visit (HOSPITAL_BASED_OUTPATIENT_CLINIC_OR_DEPARTMENT_OTHER): Payer: Medicaid Other

## 2016-06-02 ENCOUNTER — Ambulatory Visit (HOSPITAL_BASED_OUTPATIENT_CLINIC_OR_DEPARTMENT_OTHER): Payer: Medicaid Other

## 2016-06-02 VITALS — BP 105/81 | HR 91 | Temp 98.8°F | Resp 16

## 2016-06-02 DIAGNOSIS — C50411 Malignant neoplasm of upper-outer quadrant of right female breast: Secondary | ICD-10-CM | POA: Diagnosis not present

## 2016-06-02 DIAGNOSIS — Z5111 Encounter for antineoplastic chemotherapy: Secondary | ICD-10-CM | POA: Diagnosis not present

## 2016-06-02 LAB — CBC WITH DIFFERENTIAL/PLATELET
BASO%: 0.4 % (ref 0.0–2.0)
Basophils Absolute: 0 10*3/uL (ref 0.0–0.1)
EOS%: 0.9 % (ref 0.0–7.0)
Eosinophils Absolute: 0 10*3/uL (ref 0.0–0.5)
HCT: 28.6 % — ABNORMAL LOW (ref 34.8–46.6)
HGB: 9.5 g/dL — ABNORMAL LOW (ref 11.6–15.9)
LYMPH%: 41.7 % (ref 14.0–49.7)
MCH: 30.1 pg (ref 25.1–34.0)
MCHC: 33.2 g/dL (ref 31.5–36.0)
MCV: 90.5 fL (ref 79.5–101.0)
MONO#: 0.1 10*3/uL (ref 0.1–0.9)
MONO%: 6.1 % (ref 0.0–14.0)
NEUT#: 1.2 10*3/uL — ABNORMAL LOW (ref 1.5–6.5)
NEUT%: 50.9 % (ref 38.4–76.8)
Platelets: 214 10*3/uL (ref 145–400)
RBC: 3.16 10*6/uL — ABNORMAL LOW (ref 3.70–5.45)
RDW: 14.2 % (ref 11.2–14.5)
WBC: 2.3 10*3/uL — ABNORMAL LOW (ref 3.9–10.3)
lymph#: 1 10*3/uL (ref 0.9–3.3)

## 2016-06-02 LAB — COMPREHENSIVE METABOLIC PANEL
ALT: 10 U/L (ref 0–55)
AST: 12 U/L (ref 5–34)
Albumin: 3.5 g/dL (ref 3.5–5.0)
Alkaline Phosphatase: 69 U/L (ref 40–150)
Anion Gap: 11 mEq/L (ref 3–11)
BUN: 7 mg/dL (ref 7.0–26.0)
CO2: 26 mEq/L (ref 22–29)
Calcium: 8.8 mg/dL (ref 8.4–10.4)
Chloride: 104 mEq/L (ref 98–109)
Creatinine: 0.8 mg/dL (ref 0.6–1.1)
EGFR: >90 mL/min/{1.73_m2} (ref >90)
Glucose: 133 mg/dl (ref 70–140)
Potassium: 3.7 mEq/L (ref 3.5–5.1)
Sodium: 141 mEq/L (ref 136–145)
Total Bilirubin: <0.3 mg/dL (ref 0.20–1.20)
Total Protein: 6.8 g/dL (ref 6.4–8.3)

## 2016-06-02 MED ORDER — PALONOSETRON HCL INJECTION 0.25 MG/5ML
INTRAVENOUS | Status: AC
  Filled 2016-06-02: qty 5

## 2016-06-02 MED ORDER — HEPARIN SOD (PORK) LOCK FLUSH 100 UNIT/ML IV SOLN
500.0000 [IU] | Freq: Once | INTRAVENOUS | Status: AC | PRN
  Administered 2016-06-02: 500 [IU]
  Filled 2016-06-02: qty 5

## 2016-06-02 MED ORDER — SODIUM CHLORIDE 0.9% FLUSH
10.0000 mL | INTRAVENOUS | Status: DC | PRN
  Administered 2016-06-02: 10 mL
  Filled 2016-06-02: qty 10

## 2016-06-02 MED ORDER — PACLITAXEL PROTEIN-BOUND CHEMO INJECTION 100 MG
55.0000 mg/m2 | Freq: Once | INTRAVENOUS | Status: AC
  Administered 2016-06-02: 100 mg via INTRAVENOUS
  Filled 2016-06-02: qty 20

## 2016-06-02 MED ORDER — PALONOSETRON HCL INJECTION 0.25 MG/5ML
0.2500 mg | Freq: Once | INTRAVENOUS | Status: AC
  Administered 2016-06-02: 0.25 mg via INTRAVENOUS

## 2016-06-02 MED ORDER — SODIUM CHLORIDE 0.9 % IV SOLN
Freq: Once | INTRAVENOUS | Status: AC
  Administered 2016-06-02: 10:00:00 via INTRAVENOUS

## 2016-06-02 NOTE — Patient Instructions (Signed)
[facility] Discharge Instructions for Patients Receiving Chemotherapy  Today you received the following chemotherapy agents Abraxane To help prevent nausea and vomiting after your treatment, we encourage you to take your nausea medication as prescribed.   If you develop nausea and vomiting that is not controlled by your nausea medication, call the clinic.   BELOW ARE SYMPTOMS THAT SHOULD BE REPORTED IMMEDIATELY:  *FEVER GREATER THAN 100.5 F  *CHILLS WITH OR WITHOUT FEVER  NAUSEA AND VOMITING THAT IS NOT CONTROLLED WITH YOUR NAUSEA MEDICATION  *UNUSUAL SHORTNESS OF BREATH  *UNUSUAL BRUISING OR BLEEDING  TENDERNESS IN MOUTH AND THROAT WITH OR WITHOUT PRESENCE OF ULCERS  *URINARY PROBLEMS  *BOWEL PROBLEMS  UNUSUAL RASH Items with * indicate a potential emergency and should be followed up as soon as possible.  Feel free to call the clinic you have any questions or concerns. The clinic phone number is [phone].  Please show the CHEMO ALERT CARD at check-in to the Emergency Department and triage nurse.   

## 2016-06-02 NOTE — Progress Notes (Signed)
Okay to treat today despite ANC 1.2 per Dr. Lindi Adie.

## 2016-06-09 ENCOUNTER — Encounter: Payer: Self-pay | Admitting: Hematology and Oncology

## 2016-06-09 ENCOUNTER — Ambulatory Visit (HOSPITAL_BASED_OUTPATIENT_CLINIC_OR_DEPARTMENT_OTHER): Payer: Medicaid Other | Admitting: Hematology and Oncology

## 2016-06-09 ENCOUNTER — Other Ambulatory Visit (HOSPITAL_BASED_OUTPATIENT_CLINIC_OR_DEPARTMENT_OTHER): Payer: Medicaid Other

## 2016-06-09 ENCOUNTER — Ambulatory Visit (HOSPITAL_BASED_OUTPATIENT_CLINIC_OR_DEPARTMENT_OTHER): Payer: Medicaid Other

## 2016-06-09 VITALS — BP 114/81 | HR 95 | Temp 98.5°F | Resp 18 | Ht 60.0 in | Wt 163.3 lb

## 2016-06-09 DIAGNOSIS — C50411 Malignant neoplasm of upper-outer quadrant of right female breast: Secondary | ICD-10-CM | POA: Diagnosis not present

## 2016-06-09 DIAGNOSIS — Z5111 Encounter for antineoplastic chemotherapy: Secondary | ICD-10-CM

## 2016-06-09 LAB — CBC WITH DIFFERENTIAL/PLATELET
BASO%: 0.4 % (ref 0.0–2.0)
Basophils Absolute: 0 10*3/uL (ref 0.0–0.1)
EOS%: 0.8 % (ref 0.0–7.0)
Eosinophils Absolute: 0 10*3/uL (ref 0.0–0.5)
HCT: 29.7 % — ABNORMAL LOW (ref 34.8–46.6)
HGB: 9.8 g/dL — ABNORMAL LOW (ref 11.6–15.9)
LYMPH%: 35.6 % (ref 14.0–49.7)
MCH: 30 pg (ref 25.1–34.0)
MCHC: 33 g/dL (ref 31.5–36.0)
MCV: 90.8 fL (ref 79.5–101.0)
MONO#: 0.2 10*3/uL (ref 0.1–0.9)
MONO%: 6.3 % (ref 0.0–14.0)
NEUT#: 1.4 10*3/uL — ABNORMAL LOW (ref 1.5–6.5)
NEUT%: 56.9 % (ref 38.4–76.8)
Platelets: 214 10*3/uL (ref 145–400)
RBC: 3.27 10*6/uL — ABNORMAL LOW (ref 3.70–5.45)
RDW: 13.7 % (ref 11.2–14.5)
WBC: 2.5 10*3/uL — ABNORMAL LOW (ref 3.9–10.3)
lymph#: 0.9 10*3/uL (ref 0.9–3.3)
nRBC: 0 % (ref 0–0)

## 2016-06-09 LAB — COMPREHENSIVE METABOLIC PANEL
ALT: 13 U/L (ref 0–55)
AST: 17 U/L (ref 5–34)
Albumin: 3.6 g/dL (ref 3.5–5.0)
Alkaline Phosphatase: 75 U/L (ref 40–150)
Anion Gap: 11 mEq/L (ref 3–11)
BUN: 8.9 mg/dL (ref 7.0–26.0)
CO2: 25 mEq/L (ref 22–29)
Calcium: 9.2 mg/dL (ref 8.4–10.4)
Chloride: 105 mEq/L (ref 98–109)
Creatinine: 0.8 mg/dL (ref 0.6–1.1)
EGFR: >90 mL/min/{1.73_m2} (ref >90)
Glucose: 149 mg/dl — ABNORMAL HIGH (ref 70–140)
Potassium: 3.8 mEq/L (ref 3.5–5.1)
Sodium: 141 mEq/L (ref 136–145)
Total Bilirubin: <0.3 mg/dL (ref 0.20–1.20)
Total Protein: 7 g/dL (ref 6.4–8.3)

## 2016-06-09 MED ORDER — SODIUM CHLORIDE 0.9 % IV SOLN
Freq: Once | INTRAVENOUS | Status: AC
  Administered 2016-06-09: 09:00:00 via INTRAVENOUS

## 2016-06-09 MED ORDER — PALONOSETRON HCL INJECTION 0.25 MG/5ML
INTRAVENOUS | Status: AC
  Filled 2016-06-09: qty 5

## 2016-06-09 MED ORDER — PALONOSETRON HCL INJECTION 0.25 MG/5ML
0.2500 mg | Freq: Once | INTRAVENOUS | Status: AC
  Administered 2016-06-09: 0.25 mg via INTRAVENOUS

## 2016-06-09 MED ORDER — HEPARIN SOD (PORK) LOCK FLUSH 100 UNIT/ML IV SOLN
500.0000 [IU] | Freq: Once | INTRAVENOUS | Status: AC | PRN
  Administered 2016-06-09: 500 [IU]
  Filled 2016-06-09: qty 5

## 2016-06-09 MED ORDER — PACLITAXEL PROTEIN-BOUND CHEMO INJECTION 100 MG
55.0000 mg/m2 | Freq: Once | INTRAVENOUS | Status: AC
  Administered 2016-06-09: 100 mg via INTRAVENOUS
  Filled 2016-06-09: qty 20

## 2016-06-09 MED ORDER — SODIUM CHLORIDE 0.9% FLUSH
10.0000 mL | INTRAVENOUS | Status: DC | PRN
  Administered 2016-06-09: 10 mL
  Filled 2016-06-09: qty 10

## 2016-06-09 NOTE — Patient Instructions (Signed)
[facility] Discharge Instructions for Patients Receiving Chemotherapy  Today you received the following chemotherapy agents Abraxane To help prevent nausea and vomiting after your treatment, we encourage you to take your nausea medication as prescribed.   If you develop nausea and vomiting that is not controlled by your nausea medication, call the clinic.   BELOW ARE SYMPTOMS THAT SHOULD BE REPORTED IMMEDIATELY:  *FEVER GREATER THAN 100.5 F  *CHILLS WITH OR WITHOUT FEVER  NAUSEA AND VOMITING THAT IS NOT CONTROLLED WITH YOUR NAUSEA MEDICATION  *UNUSUAL SHORTNESS OF BREATH  *UNUSUAL BRUISING OR BLEEDING  TENDERNESS IN MOUTH AND THROAT WITH OR WITHOUT PRESENCE OF ULCERS  *URINARY PROBLEMS  *BOWEL PROBLEMS  UNUSUAL RASH Items with * indicate a potential emergency and should be followed up as soon as possible.  Feel free to call the clinic you have any questions or concerns. The clinic phone number is [phone].  Please show the CHEMO ALERT CARD at check-in to the Emergency Department and triage nurse.   

## 2016-06-09 NOTE — Assessment & Plan Note (Signed)
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 Adjuvant chemotherapy (Adriamycin and Cytoxan dose dense 4 followed by Abraxane weekly 12) 2. Followed by radiation  -------------------------------------------------------------------------------------------------------------------------- Current Treatment: Completed 4 cycles of Dose dense Adriamycin and Cytoxan ; today is cycle 9/12 Abraxane BRCA2: Recommended Bil Salpingo oophorectomy ECHO 02/08/16: EF 55%  Chemotherapy toxicities: 1. Profound nausea: Aloxi significantly improved her symptoms. 2. Anemia due to chemotherapy: Decreasing the dose of Abraxane from cycle 3 to 65 mg/m 3. Decreased appetite and taste 4. Alopecia 5. Neutropenia: ANC 1100: Patient is okay to treat. I reduced the dosage of chemotherapy.  We are monitoring her closely for toxicities. RTC in 2 week for cycle 11 Abraxane

## 2016-06-09 NOTE — Progress Notes (Signed)
Patient Care Team: Iona Beard, MD as PCP - General (Family Medicine)  SUMMARY OF ONCOLOGIC HISTORY:   Breast cancer of upper-outer quadrant of right female breast ([place])   11/25/2015 Mammogram Right breast mass 12 cm from the nipple: 1.4 x 1.3 x 0.9 cm, two right axillary lymph nodes with borderline cortical thickening measuring up to 3 mm   12/04/2015 Initial Diagnosis Right breast biopsy 10:00: Invasive ductal carcinoma with DCIS, grade 2, ER 0%, PR 0%, HER-2 negative, ratio1.31, Ki-67 40%   01/13/2016 Procedure 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   01/20/2016 Surgery Rt Lumpectomy: IDC 1.7 cm, grade 3, with DCIS, 0/2 LN, ER 0%, PR 0%, Her 2 Neg Ratio 1.35, Ki 67: 40%, T1CN0 (stage 1A)   02/11/2016 -  Chemotherapy Adjuvant chemotherapy with dose dense Adriamycin and Cytoxan 4 followed by Abraxane weekly 12    CHIEF COMPLIANT: Cycle 9 Abraxane  INTERVAL HISTORY: Alison [name] is a 55 year old with above-mentioned history of right breast cancer treated with lumpectomy and is currently on adjuvant chemotherapy and today is cycle 9 of Abraxane. She is tolerating chemotherapy extremely well. Apart from low blood counts she has not felt any major side effects. She denies any tingling or numbness. She does complain of darkening of fingernails and the skin of the hands. Denies any nausea or vomiting. Denies any fatigue.  REVIEW OF SYSTEMS:   Constitutional: Denies fevers, chills or abnormal weight loss Eyes: Denies blurriness of vision Ears, nose, mouth, throat, and face: Denies mucositis or sore throat Respiratory: Denies cough, dyspnea or wheezes Cardiovascular: Denies palpitation, chest discomfort Gastrointestinal:  Denies nausea, heartburn or change in bowel habits Skin: Denies abnormal skin rashes Lymphatics: Denies new lymphadenopathy or  easy bruising Neurological:Denies numbness, tingling or new weaknesses Behavioral/Psych: Mood is stable, no new changes  Extremities: No lower extremity edema  All other systems were reviewed with the patient and are negative.  I have reviewed the past medical history, past surgical history, social history and family history with the patient and they are unchanged from previous note.  ALLERGIES:  has No Known Allergies.  MEDICATIONS:  Current Outpatient Prescriptions  Medication Sig Dispense Refill  . acetaminophen (TYLENOL) 325 MG tablet Take 650 mg by mouth every 4 (four) hours as needed for mild pain.     . benztropine (COGENTIN) 0.5 MG tablet Take 0.5 mg by mouth 2 (two) times daily.    . Cyclophosphamide (CYTOXAN IJ) Inject as directed every 14 (fourteen) days.    . divalproex (DEPAKOTE ER) 500 MG 24 hr tablet Take 1,000 mg by mouth at bedtime.    Marland Kitchen DOXOrubicin HCl (ADRIAMYCIN IV) Inject into the vein every 14 (fourteen) days.    . furosemide (LASIX) 40 MG tablet Take 40 mg by mouth every morning.    Marland Kitchen lisinopril (PRINIVIL,ZESTRIL) 10 MG tablet Take 10 mg by mouth every morning.     . metFORMIN (GLUCOPHAGE) 500 MG tablet Take by mouth 2 (two) times daily with a meal.    . OLANZapine (ZYPREXA) 10 MG tablet Take 10 mg by mouth at bedtime.    Marland Kitchen omeprazole (PRILOSEC) 20 MG capsule Take 40 mg by mouth every morning.    Marland Kitchen oxyCODONE-acetaminophen (ROXICET) 5-325 MG tablet Take 1-2 tablets by mouth every 4 (four) hours as needed for moderate pain or severe pain. 30 tablet 0  . Polyethyl Glycol-Propyl Glycol (SYSTANE) 0.4-0.3 % SOLN Apply  1 drop to eye 4 (four) times daily.    . simvastatin (ZOCOR) 10 MG tablet Take 10 mg by mouth at bedtime.     No current facility-administered medications for this visit.   Facility-Administered Medications Ordered in Other Visits  Medication Dose Route Frequency Provider Last Rate Last Dose  . 0.9 %  sodium chloride infusion   Intravenous Once Nicholas Lose, MD      . sodium chloride flush (NS) 0.9 % injection 10 mL  10 mL Intracatheter PRN Nicholas Lose, MD   10 mL at 04/14/16 1127    PHYSICAL EXAMINATION: ECOG PERFORMANCE STATUS: 1 - Symptomatic but completely ambulatory  Filed Vitals:   06/09/16 0821  BP: 114/81  Pulse: 95  Temp: 98.5 F (36.9 C)  Resp: 18   Filed Weights   06/09/16 0821  Weight: 163 lb 4.8 oz (74.072 kg)    GENERAL:alert, no distress and comfortable SKIN: skin color, texture, turgor are normal, no rashes or significant lesions EYES: normal, Conjunctiva are pink and non-injected, sclera clear OROPHARYNX:no exudate, no erythema and lips, buccal mucosa, and tongue normal  NECK: supple, thyroid normal size, non-tender, without nodularity LYMPH:  no palpable lymphadenopathy in the cervical, axillary or inguinal LUNGS: clear to auscultation and percussion with normal breathing effort HEART: regular rate & rhythm and no murmurs and no lower extremity edema ABDOMEN:abdomen soft, non-tender and normal bowel sounds MUSCULOSKELETAL:no cyanosis of digits and no clubbing  NEURO: alert & oriented x 3 with fluent speech, no focal motor/sensory deficits EXTREMITIES: No lower extremity edema  LABORATORY DATA:  I have reviewed the data as listed   Chemistry      Component Value Date/Time   NA 141 06/02/2016 0843   NA 145 01/14/2016 1420   K 3.7 06/02/2016 0843   K 4.3 01/14/2016 1420   CL 102 01/14/2016 1420   CO2 26 06/02/2016 0843   CO2 28 01/14/2016 1420   BUN 7.0 06/02/2016 0843   BUN 7 01/14/2016 1420   CREATININE 0.8 06/02/2016 0843   CREATININE 0.77 01/14/2016 1420      Component Value Date/Time   CALCIUM 8.8 06/02/2016 0843   CALCIUM 9.2 01/14/2016 1420   ALKPHOS 69 06/02/2016 0843   ALKPHOS 76 04/17/2014 0345   AST 12 06/02/2016 0843   AST 59* 04/17/2014 0345   ALT 10 06/02/2016 0843   ALT 66* 04/17/2014 0345   BILITOT <0.30 06/02/2016 0843   BILITOT <0.2* 04/17/2014 0345       Lab  Results  Component Value Date   WBC 2.5* 06/09/2016   HGB 9.8* 06/09/2016   HCT 29.7* 06/09/2016   MCV 90.8 06/09/2016   PLT 214 06/09/2016   NEUTROABS 1.4* 06/09/2016    ASSESSMENT & PLAN:  Breast cancer of upper-outer quadrant of right female breast ([place]) Rt Lumpectomy 01/20/16: 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 Adjuvant chemotherapy (Adriamycin and Cytoxan dose dense 4 followed by Abraxane weekly 12) 2. Followed by radiation  -------------------------------------------------------------------------------------------------------------------------- Current Treatment: Completed 4 cycles of Dose dense Adriamycin and Cytoxan ;  today is cycle 9/12 Abraxane BRCA2: Recommended Bil Salpingo oophorectomy ECHO 02/08/16: EF 55%  Chemotherapy toxicities: 1. Profound nausea: Aloxi significantly improved her symptoms. 2. Anemia due to chemotherapy: Decreasing the dose of Abraxane from cycle 3 to 65 mg/m 3. Decreased appetite and taste 4. Alopecia 5. Neutropenia: ANC 1400: Patient is okay to treat. I previously reduced the dosage of chemotherapy.  We are monitoring her closely for toxicities. RTC in 2 week for cycle 11 Abraxane   No orders of the defined types were placed in this encounter.   The patient has a good understanding of the overall plan. she agrees with it. she will call with any problems that may develop before the next visit here.   Rulon Eisenmenger, MD 06/09/2016

## 2016-06-16 ENCOUNTER — Other Ambulatory Visit (HOSPITAL_BASED_OUTPATIENT_CLINIC_OR_DEPARTMENT_OTHER): Payer: Medicaid Other

## 2016-06-16 ENCOUNTER — Encounter: Payer: Self-pay | Admitting: *Deleted

## 2016-06-16 ENCOUNTER — Ambulatory Visit (HOSPITAL_BASED_OUTPATIENT_CLINIC_OR_DEPARTMENT_OTHER): Payer: Medicaid Other

## 2016-06-16 VITALS — BP 116/84 | HR 99 | Temp 98.5°F | Resp 18

## 2016-06-16 DIAGNOSIS — Z5111 Encounter for antineoplastic chemotherapy: Secondary | ICD-10-CM

## 2016-06-16 DIAGNOSIS — C50411 Malignant neoplasm of upper-outer quadrant of right female breast: Secondary | ICD-10-CM

## 2016-06-16 LAB — CBC WITH DIFFERENTIAL/PLATELET
BASO%: 0.4 % (ref 0.0–2.0)
Basophils Absolute: 0 10*3/uL (ref 0.0–0.1)
EOS%: 0.8 % (ref 0.0–7.0)
Eosinophils Absolute: 0 10*3/uL (ref 0.0–0.5)
HCT: 29.5 % — ABNORMAL LOW (ref 34.8–46.6)
HGB: 9.9 g/dL — ABNORMAL LOW (ref 11.6–15.9)
LYMPH%: 34 % (ref 14.0–49.7)
MCH: 29.8 pg (ref 25.1–34.0)
MCHC: 33.6 g/dL (ref 31.5–36.0)
MCV: 88.9 fL (ref 79.5–101.0)
MONO#: 0.2 10*3/uL (ref 0.1–0.9)
MONO%: 6.3 % (ref 0.0–14.0)
NEUT#: 1.5 10*3/uL (ref 1.5–6.5)
NEUT%: 58.5 % (ref 38.4–76.8)
Platelets: 210 10*3/uL (ref 145–400)
RBC: 3.32 10*6/uL — ABNORMAL LOW (ref 3.70–5.45)
RDW: 13.3 % (ref 11.2–14.5)
WBC: 2.6 10*3/uL — ABNORMAL LOW (ref 3.9–10.3)
lymph#: 0.9 10*3/uL (ref 0.9–3.3)

## 2016-06-16 LAB — COMPREHENSIVE METABOLIC PANEL
ALT: 13 U/L (ref 0–55)
AST: 16 U/L (ref 5–34)
Albumin: 3.7 g/dL (ref 3.5–5.0)
Alkaline Phosphatase: 80 U/L (ref 40–150)
Anion Gap: 12 mEq/L — ABNORMAL HIGH (ref 3–11)
BUN: 13.2 mg/dL (ref 7.0–26.0)
CO2: 26 mEq/L (ref 22–29)
Calcium: 9.5 mg/dL (ref 8.4–10.4)
Chloride: 104 mEq/L (ref 98–109)
Creatinine: 0.8 mg/dL (ref 0.6–1.1)
EGFR: >90 mL/min/{1.73_m2} (ref >90)
Glucose: 114 mg/dl (ref 70–140)
Potassium: 3.8 mEq/L (ref 3.5–5.1)
Sodium: 142 mEq/L (ref 136–145)
Total Bilirubin: <0.3 mg/dL (ref 0.20–1.20)
Total Protein: 7.4 g/dL (ref 6.4–8.3)

## 2016-06-16 MED ORDER — SODIUM CHLORIDE 0.9% FLUSH
10.0000 mL | INTRAVENOUS | Status: DC | PRN
  Administered 2016-06-16: 10 mL
  Filled 2016-06-16: qty 10

## 2016-06-16 MED ORDER — PACLITAXEL PROTEIN-BOUND CHEMO INJECTION 100 MG
55.0000 mg/m2 | Freq: Once | INTRAVENOUS | Status: AC
  Administered 2016-06-16: 100 mg via INTRAVENOUS
  Filled 2016-06-16: qty 20

## 2016-06-16 MED ORDER — PALONOSETRON HCL INJECTION 0.25 MG/5ML
INTRAVENOUS | Status: AC
  Filled 2016-06-16: qty 5

## 2016-06-16 MED ORDER — PALONOSETRON HCL INJECTION 0.25 MG/5ML
0.2500 mg | Freq: Once | INTRAVENOUS | Status: AC
  Administered 2016-06-16: 0.25 mg via INTRAVENOUS

## 2016-06-16 MED ORDER — HEPARIN SOD (PORK) LOCK FLUSH 100 UNIT/ML IV SOLN
500.0000 [IU] | Freq: Once | INTRAVENOUS | Status: AC | PRN
  Administered 2016-06-16: 500 [IU]
  Filled 2016-06-16: qty 5

## 2016-06-16 MED ORDER — SODIUM CHLORIDE 0.9 % IV SOLN
Freq: Once | INTRAVENOUS | Status: AC
  Administered 2016-06-16: 12:00:00 via INTRAVENOUS

## 2016-06-16 NOTE — Patient Instructions (Signed)
[facility] Discharge Instructions for Patients Receiving Chemotherapy  Today you received the following chemotherapy agents Abraxane  To help prevent nausea and vomiting after your treatment, we encourage you to take your nausea medication    If you develop nausea and vomiting that is not controlled by your nausea medication, call the clinic.   BELOW ARE SYMPTOMS THAT SHOULD BE REPORTED IMMEDIATELY:  *FEVER GREATER THAN 100.5 F  *CHILLS WITH OR WITHOUT FEVER  NAUSEA AND VOMITING THAT IS NOT CONTROLLED WITH YOUR NAUSEA MEDICATION  *UNUSUAL SHORTNESS OF BREATH  *UNUSUAL BRUISING OR BLEEDING  TENDERNESS IN MOUTH AND THROAT WITH OR WITHOUT PRESENCE OF ULCERS  *URINARY PROBLEMS  *BOWEL PROBLEMS  UNUSUAL RASH Items with * indicate a potential emergency and should be followed up as soon as possible.  Feel free to call the clinic you have any questions or concerns. The clinic phone number is [phone].  Please show the CHEMO ALERT CARD at check-in to the Emergency Department and triage nurse.   

## 2016-06-22 NOTE — Assessment & Plan Note (Signed)
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 Adjuvant chemotherapy (Adriamycin and Cytoxan dose dense 4 followed by Abraxane weekly 12) 2. Followed by radiation  -------------------------------------------------------------------------------------------------------------------------- Current Treatment: Completed 4 cycles of Dose dense Adriamycin and Cytoxan ; today is cycle 11/12 Abraxane BRCA2: Recommended Bil Salpingo oophorectomy ECHO 02/08/16: EF 55%  Chemotherapy toxicities: 1. Profound nausea: Aloxi significantly improved her symptoms. 2. Anemia due to chemotherapy: Decreasing the dose of Abraxane from cycle 3 to 65 mg/m 3. Decreased appetite and taste 4. Alopecia 5. Neutropenia: ANC 1400: Patient is okay to treat. I previously reduced the dosage of chemotherapy.  We are monitoring her closely for toxicities. She will come next week for last abraxane. RTC in 3 months after completing adj XRT

## 2016-06-23 ENCOUNTER — Encounter: Payer: Self-pay | Admitting: Hematology and Oncology

## 2016-06-23 ENCOUNTER — Ambulatory Visit (HOSPITAL_BASED_OUTPATIENT_CLINIC_OR_DEPARTMENT_OTHER): Payer: Medicaid Other

## 2016-06-23 ENCOUNTER — Ambulatory Visit (HOSPITAL_BASED_OUTPATIENT_CLINIC_OR_DEPARTMENT_OTHER): Payer: Medicaid Other | Admitting: Hematology and Oncology

## 2016-06-23 ENCOUNTER — Telehealth: Payer: Self-pay | Admitting: Hematology and Oncology

## 2016-06-23 ENCOUNTER — Other Ambulatory Visit (HOSPITAL_BASED_OUTPATIENT_CLINIC_OR_DEPARTMENT_OTHER): Payer: Medicaid Other

## 2016-06-23 VITALS — BP 116/76 | HR 91 | Temp 97.9°F | Resp 18 | Ht 60.0 in | Wt 167.7 lb

## 2016-06-23 DIAGNOSIS — Z5111 Encounter for antineoplastic chemotherapy: Secondary | ICD-10-CM

## 2016-06-23 DIAGNOSIS — Z171 Estrogen receptor negative status [ER-]: Secondary | ICD-10-CM | POA: Diagnosis not present

## 2016-06-23 DIAGNOSIS — C50411 Malignant neoplasm of upper-outer quadrant of right female breast: Secondary | ICD-10-CM

## 2016-06-23 DIAGNOSIS — Z9181 History of falling: Secondary | ICD-10-CM

## 2016-06-23 LAB — COMPREHENSIVE METABOLIC PANEL
ALT: 9 U/L (ref 0–55)
AST: 13 U/L (ref 5–34)
Albumin: 3.4 g/dL — ABNORMAL LOW (ref 3.5–5.0)
Alkaline Phosphatase: 70 U/L (ref 40–150)
Anion Gap: 11 mEq/L (ref 3–11)
BUN: 17.9 mg/dL (ref 7.0–26.0)
CO2: 25 mEq/L (ref 22–29)
Calcium: 9 mg/dL (ref 8.4–10.4)
Chloride: 103 mEq/L (ref 98–109)
Creatinine: 0.8 mg/dL (ref 0.6–1.1)
EGFR: >90 mL/min/{1.73_m2} (ref >90)
Glucose: 103 mg/dl (ref 70–140)
Potassium: 4.4 mEq/L (ref 3.5–5.1)
Sodium: 140 mEq/L (ref 136–145)
Total Bilirubin: <0.3 mg/dL (ref 0.20–1.20)
Total Protein: 6.6 g/dL (ref 6.4–8.3)

## 2016-06-23 LAB — CBC WITH DIFFERENTIAL/PLATELET
BASO%: 0.6 % (ref 0.0–2.0)
Basophils Absolute: 0 10*3/uL (ref 0.0–0.1)
EOS%: 1 % (ref 0.0–7.0)
Eosinophils Absolute: 0 10*3/uL (ref 0.0–0.5)
HCT: 29.9 % — ABNORMAL LOW (ref 34.8–46.6)
HGB: 9.8 g/dL — ABNORMAL LOW (ref 11.6–15.9)
LYMPH%: 37.1 % (ref 14.0–49.7)
MCH: 29.4 pg (ref 25.1–34.0)
MCHC: 32.9 g/dL (ref 31.5–36.0)
MCV: 89.4 fL (ref 79.5–101.0)
MONO#: 0.2 10*3/uL (ref 0.1–0.9)
MONO%: 7.9 % (ref 0.0–14.0)
NEUT#: 1.3 10*3/uL — ABNORMAL LOW (ref 1.5–6.5)
NEUT%: 53.4 % (ref 38.4–76.8)
Platelets: 221 10*3/uL (ref 145–400)
RBC: 3.34 10*6/uL — ABNORMAL LOW (ref 3.70–5.45)
RDW: 13.8 % (ref 11.2–14.5)
WBC: 2.5 10*3/uL — ABNORMAL LOW (ref 3.9–10.3)
lymph#: 0.9 10*3/uL (ref 0.9–3.3)

## 2016-06-23 MED ORDER — SODIUM CHLORIDE 0.9% FLUSH
10.0000 mL | INTRAVENOUS | Status: DC | PRN
  Administered 2016-06-23: 10 mL
  Filled 2016-06-23: qty 10

## 2016-06-23 MED ORDER — PALONOSETRON HCL INJECTION 0.25 MG/5ML
0.2500 mg | Freq: Once | INTRAVENOUS | Status: AC
  Administered 2016-06-23: 0.25 mg via INTRAVENOUS

## 2016-06-23 MED ORDER — PALONOSETRON HCL INJECTION 0.25 MG/5ML
INTRAVENOUS | Status: AC
  Filled 2016-06-23: qty 5

## 2016-06-23 MED ORDER — PACLITAXEL PROTEIN-BOUND CHEMO INJECTION 100 MG
55.0000 mg/m2 | Freq: Once | INTRAVENOUS | Status: AC
  Administered 2016-06-23: 100 mg via INTRAVENOUS
  Filled 2016-06-23: qty 20

## 2016-06-23 MED ORDER — HEPARIN SOD (PORK) LOCK FLUSH 100 UNIT/ML IV SOLN
500.0000 [IU] | Freq: Once | INTRAVENOUS | Status: AC | PRN
  Administered 2016-06-23: 500 [IU]
  Filled 2016-06-23: qty 5

## 2016-06-23 MED ORDER — SODIUM CHLORIDE 0.9 % IV SOLN
Freq: Once | INTRAVENOUS | Status: AC
  Administered 2016-06-23: 10:00:00 via INTRAVENOUS

## 2016-06-23 NOTE — Patient Instructions (Signed)
[facility] Discharge Instructions for Patients Receiving Chemotherapy  Today you received the following chemotherapy agents Abraxane  To help prevent nausea and vomiting after your treatment, we encourage you to take your nausea medication    If you develop nausea and vomiting that is not controlled by your nausea medication, call the clinic.   BELOW ARE SYMPTOMS THAT SHOULD BE REPORTED IMMEDIATELY:  *FEVER GREATER THAN 100.5 F  *CHILLS WITH OR WITHOUT FEVER  NAUSEA AND VOMITING THAT IS NOT CONTROLLED WITH YOUR NAUSEA MEDICATION  *UNUSUAL SHORTNESS OF BREATH  *UNUSUAL BRUISING OR BLEEDING  TENDERNESS IN MOUTH AND THROAT WITH OR WITHOUT PRESENCE OF ULCERS  *URINARY PROBLEMS  *BOWEL PROBLEMS  UNUSUAL RASH Items with * indicate a potential emergency and should be followed up as soon as possible.  Feel free to call the clinic you have any questions or concerns. The clinic phone number is [phone].  Please show the CHEMO ALERT CARD at check-in to the Emergency Department and triage nurse.   

## 2016-06-23 NOTE — Telephone Encounter (Signed)
appt made and avs printed °

## 2016-06-23 NOTE — Progress Notes (Signed)
Okay to treat with ANC-1.3 today per Dr. Lindi Adie.

## 2016-06-23 NOTE — Progress Notes (Signed)
Patient Care Team: Iona Beard, MD as PCP - General (Family Medicine)  SUMMARY OF ONCOLOGIC HISTORY:   Breast cancer of upper-outer quadrant of right female breast ([place])   11/25/2015 Mammogram Right breast mass 12 cm from the nipple: 1.4 x 1.3 x 0.9 cm, two right axillary lymph nodes with borderline cortical thickening measuring up to 3 mm   12/04/2015 Initial Diagnosis Right breast biopsy 10:00: Invasive ductal carcinoma with DCIS, grade 2, ER 0%, PR 0%, HER-2 negative, ratio1.31, Ki-67 40%   01/13/2016 Procedure 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   01/20/2016 Surgery Rt Lumpectomy: IDC 1.7 cm, grade 3, with DCIS, 0/2 LN, ER 0%, PR 0%, Her 2 Neg Ratio 1.35, Ki 67: 40%, T1CN0 (stage 1A)   02/11/2016 -  Chemotherapy Adjuvant chemotherapy with dose dense Adriamycin and Cytoxan 4 followed by Abraxane weekly 12    CHIEF COMPLIANT: Cycle 11 Abraxane  INTERVAL HISTORY: Alison [name] is a 55 year old with above-mentioned history right breast cancer treated with lumpectomy and is currently on adjuvant chemotherapy and today's cycle 11 of Robaxin. Overall she had done fairly well. She has no neuropathy. She does have occasional nausea and intermittent fatigue. Recently she fell down a barstool by accident and hurt her back. She was seen at the [place] emergency room and did not have any fractures. She is slightly sore but otherwise doing quite well. She denies any neuropathy. She is going to school to learn how to be independent. She currently lives in a group home due to her mental disability.  REVIEW OF SYSTEMS:   Constitutional: Denies fevers, chills or abnormal weight loss, recent fall Eyes: Denies blurriness of vision Ears, nose, mouth, throat, and face: Denies mucositis or sore throat Respiratory: Denies cough, dyspnea or  wheezes Cardiovascular: Denies palpitation, chest discomfort Gastrointestinal:  Denies nausea, heartburn or change in bowel habits Skin: Denies abnormal skin rashes Lymphatics: Denies new lymphadenopathy or easy bruising Neurological:Denies numbness, tingling or new weaknesses Behavioral/Psych: Mood is stable, no new changes  Extremities: No lower extremity edema Breast:  denies any pain or lumps or nodules in either breasts All other systems were reviewed with the patient and are negative.  I have reviewed the past medical history, past surgical history, social history and family history with the patient and they are unchanged from previous note.  ALLERGIES:  has No Known Allergies.  MEDICATIONS:  Current Outpatient Prescriptions  Medication Sig Dispense Refill  . acetaminophen (TYLENOL) 325 MG tablet Take 650 mg by mouth every 4 (four) hours as needed for mild pain.     . benztropine (COGENTIN) 0.5 MG tablet Take 0.5 mg by mouth 2 (two) times daily.    . Cyclophosphamide (CYTOXAN IJ) Inject as directed every 14 (fourteen) days.    . divalproex (DEPAKOTE ER) 500 MG 24 hr tablet Take 1,000 mg by mouth at bedtime.    Marland Kitchen DOXOrubicin HCl (ADRIAMYCIN IV) Inject into the vein every 14 (fourteen) days.    . furosemide (LASIX) 40 MG tablet Take 40 mg by mouth every morning.    Marland Kitchen lisinopril (PRINIVIL,ZESTRIL) 10 MG tablet Take 10 mg by mouth every morning.     . metFORMIN (GLUCOPHAGE) 500 MG tablet Take by mouth 2 (two) times daily with a meal.    . OLANZapine (ZYPREXA) 10 MG tablet Take 10 mg by mouth at bedtime.    Marland Kitchen omeprazole (PRILOSEC) 20 MG capsule  Take 40 mg by mouth every morning.    Marland Kitchen oxyCODONE-acetaminophen (ROXICET) 5-325 MG tablet Take 1-2 tablets by mouth every 4 (four) hours as needed for moderate pain or severe pain. 30 tablet 0  . Polyethyl Glycol-Propyl Glycol (SYSTANE) 0.4-0.3 % SOLN Apply 1 drop to eye 4 (four) times daily.    . simvastatin (ZOCOR) 10 MG tablet Take 10 mg by  mouth at bedtime.     No current facility-administered medications for this visit.   Facility-Administered Medications Ordered in Other Visits  Medication Dose Route Frequency Provider Last Rate Last Dose  . 0.9 %  sodium chloride infusion   Intravenous Once Nicholas Lose, MD      . sodium chloride flush (NS) 0.9 % injection 10 mL  10 mL Intracatheter PRN Nicholas Lose, MD   10 mL at 04/14/16 1127    PHYSICAL EXAMINATION: ECOG PERFORMANCE STATUS: 0 - Asymptomatic  Filed Vitals:   06/23/16 0854  BP: 116/76  Pulse: 91  Temp: 97.9 F (36.6 C)  Resp: 18   Filed Weights   06/23/16 0854  Weight: 167 lb 11.2 oz (76.068 kg)    GENERAL:alert, no distress and comfortable SKIN: skin color, texture, turgor are normal, no rashes or significant lesions EYES: normal, Conjunctiva are pink and non-injected, sclera clear OROPHARYNX:no exudate, no erythema and lips, buccal mucosa, and tongue normal  NECK: supple, thyroid normal size, non-tender, without nodularity LYMPH:  no palpable lymphadenopathy in the cervical, axillary or inguinal LUNGS: clear to auscultation and percussion with normal breathing effort HEART: regular rate & rhythm and no murmurs and no lower extremity edema ABDOMEN:abdomen soft, non-tender and normal bowel sounds MUSCULOSKELETAL:no cyanosis of digits and no clubbing  NEURO: alert & oriented x 3 with fluent speech, no focal motor/sensory deficits EXTREMITIES: No lower extremity edema  LABORATORY DATA:  I have reviewed the data as listed   Chemistry      Component Value Date/Time   NA 142 06/16/2016 1032   NA 145 01/14/2016 1420   K 3.8 06/16/2016 1032   K 4.3 01/14/2016 1420   CL 102 01/14/2016 1420   CO2 26 06/16/2016 1032   CO2 28 01/14/2016 1420   BUN 13.2 06/16/2016 1032   BUN 7 01/14/2016 1420   CREATININE 0.8 06/16/2016 1032   CREATININE 0.77 01/14/2016 1420      Component Value Date/Time   CALCIUM 9.5 06/16/2016 1032   CALCIUM 9.2 01/14/2016 1420    ALKPHOS 80 06/16/2016 1032   ALKPHOS 76 04/17/2014 0345   AST 16 06/16/2016 1032   AST 59* 04/17/2014 0345   ALT 13 06/16/2016 1032   ALT 66* 04/17/2014 0345   BILITOT <0.30 06/16/2016 1032   BILITOT <0.2* 04/17/2014 0345       Lab Results  Component Value Date   WBC 2.5* 06/23/2016   HGB 9.8* 06/23/2016   HCT 29.9* 06/23/2016   MCV 89.4 06/23/2016   PLT 221 06/23/2016   NEUTROABS 1.3* 06/23/2016     ASSESSMENT & PLAN:  Breast cancer of upper-outer quadrant of right female breast ([place]) Rt Lumpectomy 01/20/16: 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 Rt Lumpectomy 2017: IDC 1.7 cm, grade 3, with DCIS, 0/2 LN, ER 0%, PR 0%, Her 2  Neg Ratio 1.35, Ki 67: 40%, T1CN0 (stage 1A)  Treatment Plan: 1. Adjuvant chemotherapy (Adriamycin and Cytoxan dose dense 4 followed by Abraxane weekly 12) 2. Followed by radiation  -------------------------------------------------------------------------------------------------------------------------- Current Treatment: Completed 4 cycles of Dose dense Adriamycin and Cytoxan ; today is cycle 11/12 Abraxane BRCA2: Recommended Bil Salpingo oophorectomy ECHO 02/08/16: EF 55%  Chemotherapy toxicities: 1. Profound nausea: Aloxi significantly improved her symptoms. 2. Anemia due to chemotherapy: Decreasing the dose of Abraxane from cycle 3 to 65 mg/m 3. Decreased appetite and taste 4. Alopecia 5. Neutropenia: ANC 1400: Patient is okay to treat. I previously reduced the dosage of chemotherapy. Recent fall from a bar stool: Slightly sore. She was [facility] at [facility].  We are monitoring her closely for toxicities. She will come next week for last  abraxane. RTC in 3 months after completing adj XRT I sent a message to Dr. Isidore Moos to see her for adjuvant radiation therapy.   No orders of the defined types were placed in this encounter.   The patient has a good understanding of the overall plan. she agrees with it. she will call with any problems that may develop before the next visit here.   Rulon Eisenmenger, MD 06/23/2016

## 2016-06-27 ENCOUNTER — Ambulatory Visit (HOSPITAL_COMMUNITY)
Admission: RE | Admit: 2016-06-27 | Discharge: 2016-06-27 | Disposition: A | Discharge status: Home / Self Care | Payer: Medicaid Other | Source: Ambulatory Visit | Attending: Cardiology | Admitting: Cardiology

## 2016-06-27 ENCOUNTER — Other Ambulatory Visit: Payer: Self-pay

## 2016-06-27 ENCOUNTER — Ambulatory Visit (HOSPITAL_BASED_OUTPATIENT_CLINIC_OR_DEPARTMENT_OTHER)
Admission: RE | Admit: 2016-06-27 | Discharge: 2016-06-27 | Disposition: A | Discharge status: Home / Self Care | Payer: Medicaid Other | Source: Ambulatory Visit | Attending: Cardiology | Admitting: Cardiology

## 2016-06-27 VITALS — BP 98/66 | HR 100 | Resp 18 | Wt 160.8 lb

## 2016-06-27 DIAGNOSIS — Z79899 Other long term (current) drug therapy: Secondary | ICD-10-CM | POA: Insufficient documentation

## 2016-06-27 DIAGNOSIS — F209 Schizophrenia, unspecified: Secondary | ICD-10-CM | POA: Insufficient documentation

## 2016-06-27 DIAGNOSIS — Z7984 Long term (current) use of oral hypoglycemic drugs: Secondary | ICD-10-CM | POA: Insufficient documentation

## 2016-06-27 DIAGNOSIS — E785 Hyperlipidemia, unspecified: Secondary | ICD-10-CM | POA: Diagnosis not present

## 2016-06-27 DIAGNOSIS — C50411 Malignant neoplasm of upper-outer quadrant of right female breast: Secondary | ICD-10-CM | POA: Diagnosis not present

## 2016-06-27 DIAGNOSIS — R9431 Abnormal electrocardiogram [ECG] [EKG]: Secondary | ICD-10-CM | POA: Insufficient documentation

## 2016-06-27 DIAGNOSIS — I340 Nonrheumatic mitral (valve) insufficiency: Secondary | ICD-10-CM | POA: Insufficient documentation

## 2016-06-27 DIAGNOSIS — Z87891 Personal history of nicotine dependence: Secondary | ICD-10-CM | POA: Diagnosis not present

## 2016-06-27 DIAGNOSIS — I119 Hypertensive heart disease without heart failure: Secondary | ICD-10-CM | POA: Insufficient documentation

## 2016-06-27 DIAGNOSIS — E119 Type 2 diabetes mellitus without complications: Secondary | ICD-10-CM | POA: Insufficient documentation

## 2016-06-27 MED ORDER — CARVEDILOL 3.125 MG PO TABS: 3.1250 mg | ORAL_TABLET | Freq: Two times a day (BID) | ORAL | Status: DC

## 2016-06-27 NOTE — Progress Notes (Signed)
Echocardiogram 2D Echocardiogram has been performed.  Alison [name] 06/27/2016, 10:37 AM

## 2016-06-27 NOTE — Patient Instructions (Signed)
You are doing great!  No changes, follow up 3 months with echocardiogram (ultrasound of your heart).

## 2016-06-27 NOTE — Progress Notes (Signed)
Advanced Heart Failure Medication Review by a Pharmacist  Does the patient  feel that his/her medications are working for him/her?  yes  Has the patient been experiencing any side effects to the medications prescribed?  no  Does the patient measure his/her own blood pressure or blood glucose at home?  no   Does the patient have any problems obtaining medications due to transportation or finances?   no  Understanding of regimen: fair Understanding of indications: fair Potential of compliance: good Patient understands to avoid NSAIDs. Patient understands to avoid decongestants.  Issues to address at subsequent visits: None   Pharmacist comments:  Alison [name] is a pleasant 55 yo F presenting without a medication list but able to verbalize her regimen to me. She reports good compliance with her regimen and did not have any specific medication-related questions or concerns for me at this time.   Ruta Hinds. Velva Harman, PharmD, BCPS, CPP Clinical Pharmacist Pager: [phone] Phone: [phone] 06/27/2016 11:35 AM      Time with patient: 6 minutes Preparation and documentation time: 2 minutes Total time: 8 minutes

## 2016-06-28 ENCOUNTER — Telehealth (HOSPITAL_COMMUNITY): Payer: Self-pay | Admitting: *Deleted

## 2016-06-28 NOTE — Telephone Encounter (Signed)
Received call from pt's facility ([facility]) stating they needed an order to give pt her new med, order needs to state duration or med and dx, order faxed to them [id number]

## 2016-06-28 NOTE — Progress Notes (Signed)
Patient ID: Alison [name], female   DOB: 12-19-1960, 55 y.o.   MRN: [id number] PCP: Dr. Berdine Addison Oncologist: Dr. Lindi Adie  55 yo with history of HTN, diabetes, schizophrenia, and recent breast cancer diagnosis presents for cardio-oncology clinic evaluation.  Breast cancer was diagnosed 2/17 with right lumpectomy. ER-/PR-/HER2-.  BRCA2+.  She started chemotherapy in 3/17 with Adriamycin + Cytoxan x 4, then abraxane weekly x 12.  She has once dose of abraxane left.  She will start radiation next.    She has no history of cardiac problems.  She used to smoke but quit 01/25/16.  No exertional dyspnea or chest pain. No lightheadedness.   ECG: NSR, rate 99  PMH: 1. HTN 2. Diabetes 3. Hyperlipidemia 4. Schizophrenia: Lives in group home.  5. Breast cancer: Diagnosed 2/17 with right lumpectomy. ER-/PR-/HER2-.  BRCA2+.  She will start chemotherapy in 3/17 with Adriamycin + Cytoxan x 4, then abraxane weekly x 12.  This will be followed by radiation.  - Echo (2/17) with EF 55%, lateral s' 8.8 cm/sec, GLS -19.6%, normal RV size and systolic function. - Echo (4/17) with EF 55%, normal RV, GLS -16%, mild MR.   - Echo (7/17) with EF 50%, GLS -13.6%.   SH: Lives in group home.  Mother brings her to appts.  Quit smoking in 01/25/16.   FH: Breast cancer, no CAD.  ROS: All systems reviewed and negative except as per HPI.   Current Outpatient Prescriptions  Medication Sig Dispense Refill  . acetaminophen (TYLENOL) 325 MG tablet Take 650 mg by mouth every 4 (four) hours as needed for mild pain.     . benztropine (COGENTIN) 0.5 MG tablet Take 0.5 mg by mouth 2 (two) times daily.    . Cyclophosphamide (CYTOXAN IJ) Inject as directed every 14 (fourteen) days.    . divalproex (DEPAKOTE ER) 500 MG 24 hr tablet Take 1,000 mg by mouth at bedtime.    Marland Kitchen DOXOrubicin HCl (ADRIAMYCIN IV) Inject into the vein every 14 (fourteen) days.    . furosemide (LASIX) 40 MG tablet Take 40 mg by mouth every morning.    Marland Kitchen lisinopril  (PRINIVIL,ZESTRIL) 10 MG tablet Take 10 mg by mouth every morning.     . metFORMIN (GLUCOPHAGE) 500 MG tablet Take by mouth 2 (two) times daily with a meal.    . OLANZapine (ZYPREXA) 10 MG tablet Take 10 mg by mouth at bedtime.    Marland Kitchen omeprazole (PRILOSEC) 20 MG capsule Take 40 mg by mouth every morning.    Marland Kitchen oxyCODONE-acetaminophen (ROXICET) 5-325 MG tablet Take 1-2 tablets by mouth every 4 (four) hours as needed for moderate pain or severe pain. 30 tablet 0  . Polyethyl Glycol-Propyl Glycol (SYSTANE) 0.4-0.3 % SOLN Apply 1 drop to eye 4 (four) times daily.    . simvastatin (ZOCOR) 10 MG tablet Take 10 mg by mouth at bedtime.    . carvedilol (COREG) 3.125 MG tablet Take 1 tablet (3.125 mg total) by mouth 2 (two) times daily. 180 tablet 3   No current facility-administered medications for this encounter.   Facility-Administered Medications Ordered in Other Encounters  Medication Dose Route Frequency Provider Last Rate Last Dose  . 0.9 %  sodium chloride infusion   Intravenous Once Nicholas Lose, MD      . sodium chloride flush (NS) 0.9 % injection 10 mL  10 mL Intracatheter PRN Nicholas Lose, MD   10 mL at 04/14/16 1127   BP 98/66 mmHg  Pulse 100  Resp 18  Wt  160 lb 12 oz (72.916 kg)  SpO2 98% General: NAD Neck: No JVD, no thyromegaly or thyroid nodule.  Lungs: Clear to auscultation bilaterally with normal respiratory effort. CV: Nondisplaced PMI.  Heart regular S1/S2, no S3/S4, no murmur.  No peripheral edema.  No carotid bruit.  Normal pedal pulses.  Abdomen: Soft, nontender, no hepatosplenomegaly, no distention.  Skin: Intact without lesions or rashes.  Neurologic: Alert and oriented x 3.  Psych: Normal affect. Extremities: No clubbing or cyanosis.  HEENT: Normal.   Assessment/Plan: 1. Chemotherapy cardiotoxicity: She has completed Adriamycin for breast cancer.  Echo today shows a mild fall in EF and global longitudinal strain.  This could be an Adriamycin effect.  I will have her  start Coreg 3.125 mg bid, hopefully her BP will tolerate.  She will remain on lisinopril. Repeat echo in 3 months with followup.  2. HTN: BP is now running low.   Loralie Champagne 06/28/2016

## 2016-06-30 ENCOUNTER — Ambulatory Visit (HOSPITAL_BASED_OUTPATIENT_CLINIC_OR_DEPARTMENT_OTHER): Payer: Medicaid Other

## 2016-06-30 ENCOUNTER — Encounter: Payer: Self-pay | Admitting: *Deleted

## 2016-06-30 ENCOUNTER — Other Ambulatory Visit (HOSPITAL_BASED_OUTPATIENT_CLINIC_OR_DEPARTMENT_OTHER): Payer: Medicaid Other

## 2016-06-30 VITALS — BP 108/82 | HR 94 | Temp 97.8°F | Resp 18

## 2016-06-30 DIAGNOSIS — C50411 Malignant neoplasm of upper-outer quadrant of right female breast: Secondary | ICD-10-CM

## 2016-06-30 DIAGNOSIS — Z5111 Encounter for antineoplastic chemotherapy: Secondary | ICD-10-CM

## 2016-06-30 LAB — COMPREHENSIVE METABOLIC PANEL
ALT: 12 U/L (ref 0–55)
AST: 14 U/L (ref 5–34)
Albumin: 3.6 g/dL (ref 3.5–5.0)
Alkaline Phosphatase: 75 U/L (ref 40–150)
Anion Gap: 11 mEq/L (ref 3–11)
BUN: 13.6 mg/dL (ref 7.0–26.0)
CO2: 27 mEq/L (ref 22–29)
Calcium: 9.1 mg/dL (ref 8.4–10.4)
Chloride: 104 mEq/L (ref 98–109)
Creatinine: 0.9 mg/dL (ref 0.6–1.1)
EGFR: 89 mL/min/{1.73_m2} — ABNORMAL LOW (ref >90)
Glucose: 156 mg/dl — ABNORMAL HIGH (ref 70–140)
Potassium: 4.2 mEq/L (ref 3.5–5.1)
Sodium: 142 mEq/L (ref 136–145)
Total Bilirubin: <0.3 mg/dL (ref 0.20–1.20)
Total Protein: 7.2 g/dL (ref 6.4–8.3)

## 2016-06-30 LAB — CBC WITH DIFFERENTIAL/PLATELET
BASO%: 0.3 % (ref 0.0–2.0)
Basophils Absolute: 0 10*3/uL (ref 0.0–0.1)
EOS%: 0.7 % (ref 0.0–7.0)
Eosinophils Absolute: 0 10*3/uL (ref 0.0–0.5)
HCT: 31.1 % — ABNORMAL LOW (ref 34.8–46.6)
HGB: 10.3 g/dL — ABNORMAL LOW (ref 11.6–15.9)
LYMPH%: 35.2 % (ref 14.0–49.7)
MCH: 29.8 pg (ref 25.1–34.0)
MCHC: 33.1 g/dL (ref 31.5–36.0)
MCV: 89.9 fL (ref 79.5–101.0)
MONO#: 0.3 10*3/uL (ref 0.1–0.9)
MONO%: 8.7 % (ref 0.0–14.0)
NEUT#: 1.6 10*3/uL (ref 1.5–6.5)
NEUT%: 55.1 % (ref 38.4–76.8)
Platelets: 212 10*3/uL (ref 145–400)
RBC: 3.46 10*6/uL — ABNORMAL LOW (ref 3.70–5.45)
RDW: 12.9 % (ref 11.2–14.5)
WBC: 3 10*3/uL — ABNORMAL LOW (ref 3.9–10.3)
lymph#: 1.1 10*3/uL (ref 0.9–3.3)

## 2016-06-30 MED ORDER — SODIUM CHLORIDE 0.9 % IV SOLN
Freq: Once | INTRAVENOUS | Status: AC
  Administered 2016-06-30: 11:00:00 via INTRAVENOUS

## 2016-06-30 MED ORDER — PALONOSETRON HCL INJECTION 0.25 MG/5ML
INTRAVENOUS | Status: AC
  Filled 2016-06-30: qty 5

## 2016-06-30 MED ORDER — PACLITAXEL PROTEIN-BOUND CHEMO INJECTION 100 MG
55.0000 mg/m2 | Freq: Once | INTRAVENOUS | Status: AC
  Administered 2016-06-30: 100 mg via INTRAVENOUS
  Filled 2016-06-30: qty 20

## 2016-06-30 MED ORDER — HEPARIN SOD (PORK) LOCK FLUSH 100 UNIT/ML IV SOLN
500.0000 [IU] | Freq: Once | INTRAVENOUS | Status: AC | PRN
  Administered 2016-06-30: 500 [IU]
  Filled 2016-06-30: qty 5

## 2016-06-30 MED ORDER — PALONOSETRON HCL INJECTION 0.25 MG/5ML
0.2500 mg | Freq: Once | INTRAVENOUS | Status: AC
  Administered 2016-06-30: 0.25 mg via INTRAVENOUS

## 2016-06-30 MED ORDER — SODIUM CHLORIDE 0.9% FLUSH
10.0000 mL | INTRAVENOUS | Status: DC | PRN
  Administered 2016-06-30: 10 mL
  Filled 2016-06-30: qty 10

## 2016-06-30 NOTE — Patient Instructions (Signed)
[facility] Discharge Instructions for Patients Receiving Chemotherapy  Today you received the following chemotherapy agents: Abraxane   To help prevent nausea and vomiting after your treatment, we encourage you to take your nausea medication as directed.    If you develop nausea and vomiting that is not controlled by your nausea medication, call the clinic.   BELOW ARE SYMPTOMS THAT SHOULD BE REPORTED IMMEDIATELY:  *FEVER GREATER THAN 100.5 F  *CHILLS WITH OR WITHOUT FEVER  NAUSEA AND VOMITING THAT IS NOT CONTROLLED WITH YOUR NAUSEA MEDICATION  *UNUSUAL SHORTNESS OF BREATH  *UNUSUAL BRUISING OR BLEEDING  TENDERNESS IN MOUTH AND THROAT WITH OR WITHOUT PRESENCE OF ULCERS  *URINARY PROBLEMS  *BOWEL PROBLEMS  UNUSUAL RASH Items with * indicate a potential emergency and should be followed up as soon as possible.  Feel free to call the clinic you have any questions or concerns. The clinic phone number is [phone].  Please show the CHEMO ALERT CARD at check-in to the Emergency Department and triage nurse.   

## 2016-07-13 ENCOUNTER — Ambulatory Visit
Admission: RE | Admit: 2016-07-13 | Discharge: 2016-07-13 | Disposition: A | Discharge status: Home / Self Care | Payer: Medicaid Other | Source: Ambulatory Visit | Attending: Radiation Oncology | Admitting: Radiation Oncology

## 2016-07-13 ENCOUNTER — Encounter: Payer: Self-pay | Admitting: Radiation Oncology

## 2016-07-13 VITALS — BP 106/67 | HR 95 | Temp 98.2°F | Resp 16 | Ht 61.5 in | Wt 165.2 lb

## 2016-07-13 DIAGNOSIS — C50411 Malignant neoplasm of upper-outer quadrant of right female breast: Secondary | ICD-10-CM

## 2016-07-13 DIAGNOSIS — Z510 Encounter for antineoplastic radiation therapy: Secondary | ICD-10-CM | POA: Insufficient documentation

## 2016-07-13 NOTE — Progress Notes (Signed)
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 Breast cancer of upper-outer quadrant of right female breast ([place]) 174.4 C50.411      Narrative:  The patient returns today for follow-up unaccompanied.  Alison [name] was initially seen by me on January 25th, 2017.  She presented with a right breast abnormality on screen mammogram, which revealed to be 1.4cm. Two axillary nodes on the right had borderline diffuse cortical thickening.  Biopsy showed invasive ductal carcinoma with DCIS with characteristics described above in the diagnosis.  Since this initial consult, the patient has undergone chemotherapy with adriamycin + Cytoxan x 4, then abraxane weekly.    She underwent a lumpectomy on 01/20/16. Pathology of this revealed invasive ductal carcinoma, grade 3, ductal carcinoma in situ grade 2.  All margins of resection were negative for tumor.  Two lymph nodes that were removed from the right axillary were negative for carcinoma. The tumor size was 1.7 cm.    She is here today to consider her options for radiotherapy.    Of note: the patient is currently lives in a family group home with psychiatric issues.. I have communicated with the patient's surgeon, Dr. Ninfa Linden about her BRCA+ status.  He talked with her mother about her genetic risks and according to Dr. Ninfa Linden the patient's mother confirms that she and her daughter do not want to proceed with bilateral mastectomy and instead want to proceed with radiotherapy in the setting of breast conservation.       Lymphedema issues, if any:   no  Pain issues, if any:  no  SAFETY ISSUES: No  Prior radiation?  NO  Pacemaker/ICD? NO  Possible current pregnancy? No            ALLERGIES:  has No Known Allergies.  Meds: Current Outpatient Prescriptions  Medication Sig Dispense Refill  . acetaminophen (TYLENOL) 325 MG tablet Take 650 mg by mouth every 4 (four) hours as needed for mild pain.     . benztropine (COGENTIN) 0.5 MG tablet Take 0.5 mg by mouth 2 (two) times daily.    . carboxymethylcellulose (REFRESH CELLUVISC) 1 % ophthalmic solution Apply 1 drop to eye 2 (two) times daily.    . carvedilol (COREG) 3.125 MG tablet Take 1 tablet (3.125 mg total) by mouth 2 (two) times daily. 180 tablet 3  . Cyclophosphamide (CYTOXAN IJ) Inject as directed every 14 (fourteen) days.    . divalproex (DEPAKOTE ER) 500 MG 24 hr tablet Take 1,000 mg by mouth at bedtime.    Marland Kitchen DOXOrubicin HCl (ADRIAMYCIN IV) Inject into the vein every 14 (fourteen) days.    . furosemide (LASIX) 40 MG tablet Take 40 mg by mouth every morning.    Marland Kitchen lisinopril (PRINIVIL,ZESTRIL) 10 MG tablet Take 10 mg by mouth every morning.     . metFORMIN (GLUCOPHAGE) 500 MG tablet Take by mouth 2 (two) times daily with a meal.    . OLANZapine (ZYPREXA) 10 MG tablet Take 10 mg by mouth at bedtime.    Marland Kitchen omeprazole (PRILOSEC) 20 MG capsule Take 40 mg by mouth every morning.    Marland Kitchen oxyCODONE-acetaminophen (ROXICET) 5-325 MG tablet  Take 1-2 tablets by mouth every 4 (four) hours as needed for moderate pain or severe pain. (Patient not taking: Reported on 07/13/2016) 30 tablet 0  . Polyethyl Glycol-Propyl Glycol (SYSTANE) 0.4-0.3 % SOLN Apply 1 drop to eye 4 (four) times daily.    . simvastatin (ZOCOR) 10 MG tablet Take 10 mg by mouth at bedtime.     No current facility-administered medications for this encounter.    Facility-Administered Medications Ordered in Other Encounters  Medication Dose Route Frequency Provider Last Rate Last Dose  . 0.9 %  sodium chloride infusion   Intravenous Once Nicholas Lose, MD      . sodium chloride flush (NS) 0.9 %  injection 10 mL  10 mL Intracatheter PRN Nicholas Lose, MD   10 mL at 04/14/16 1127    Physical Findings: Marland Kitchen     Vitals - 1 value per visit 01/16/8526  SYSTOLIC 782  DIASTOLIC 67  Pulse 95  Temperature 98.2  Respirations 16  Weight (lb) 165.2  Height 5' 1.5"  BMI 30.71  VISIT REPORT     General: Alert and oriented, in no acute distress HEENT: Head is normocephalic. Extraocular movements are intact. Oropharynx is clear. Neck: Neck is supple, no palpable cervical or supraclavicular lymphadenopathy. Heart: Regular in rate and rhythm with no murmurs, rubs, or gallops. Chest: Clear to auscultation bilaterally, with no rhonchi, wheezes, or rales. Left chest P-A-C Abdomen: Soft, nontender, nondistended, with no rigidity or guarding. Extremities: No cyanosis or edema. Lymphatics: see Neck Exam Musculoskeletal: symmetric strength and muscle tone throughout. Neurologic: No obvious focalities. Speech is fluent.  Psychiatric:.The patient is alert and conversant and demonstrates basic understanding of her past and future treatments.  Breast exam reveals no palpable masses appreciated in the breast bilaterally nor in the axillary regions.  Skin: is dry particularly over her upper back.   Lab Findings: Lab Results  Component Value Date   WBC 3.0 (L) 06/30/2016   HGB 10.3 (L) 06/30/2016   HCT 31.1 (L) 06/30/2016   MCV 89.9 06/30/2016   PLT 212 06/30/2016     Radiographic Findings: No results found.  Impression/Plan: Stage IA breast cancer, right We discussed adjuvant radiotherapy today.  I recommend radiotherapy to the right breast..  The risks, benefits and side effects of this treatment were discussed in detail.  She understands that radiotherapy is associated with skin irritation and fatigue in the acute setting. Late effects can include cosmetic changes and rare injury to internal organs.   We reached out to the patient's mother to also perform a phone consent, however there was no  voicemail or answer upon this phone call.  Patient is enthusiastic about proceeding with treatment and demonstrates basic understanding of the rationale for radiotherapy and possible risks.  A consent form has been signed by the patient and placed in her chart.  _____________________________________   Eppie Gibson, MD   This document serves as a record of services personally performed by Eppie Gibson , MD. It was created on her behalf by Truddie Hidden, a trained medical scribe. The creation of this record is based on the scribe's personal observations and the provider's statements to them. This document has been checked and approved by the attending provider.

## 2016-07-13 NOTE — Progress Notes (Signed)
Please see the Nurse Progress Note in the MD Initial Consult Encounter for this patient. 

## 2016-07-13 NOTE — Progress Notes (Signed)
  Radiation Oncology         [phone] ________________________________  Name: Alison [name] MRN: [id number]  Date: 07/13/2016  DOB: 1961/07/04  SIMULATION AND TREATMENT PLANNING NOTE    Outpatient  DIAGNOSIS:      ICD-9-CM ICD-10-CM   1. Breast cancer of upper-outer quadrant of right female breast ([facility]) 174.4 C50.411     NARRATIVE:  The patient was brought to the [facility].  Identity was confirmed.  All relevant records and images related to the planned course of therapy were reviewed.  The patient freely provided informed written consent to proceed with treatment after reviewing the details related to the planned course of therapy. The consent form was witnessed and verified by the simulation staff.    Then, the patient was set-up in a stable reproducible supine position for radiation therapy with her ipsilateral arm over her head, and her upper body secured in a custom-made Vac-lok device.  CT images were obtained.  Surface markings were placed.  The CT images were loaded into the planning software.    TREATMENT PLANNING NOTE: Treatment planning then occurred.  The radiation prescription was entered and confirmed.     A total of 3 medically necessary complex treatment devices were fabricated and supervised by me: 2 fields with MLCs for custom blocks to protect heart, and lungs;  and, a Vac-lok. MORE COMPLEX DEVICES MAY BE MADE IN DOSIMETRY FOR FIELD IN FIELD BEAMS FOR DOSE HOMOGENEITY.  I have requested : 3D Simulation  I have requested a DVH of the following structures: lungs, heart, lumpectomy cavity.    The patient will receive 50 Gy in 25 fractions to the right breast with 2 tangential fields.  . This will  be followed by a boost.  Optical Surface Tracking Plan:  Since intensity modulated radiotherapy (IMRT) and 3D conformal radiation treatment methods are predicated on accurate and precise positioning for treatment, intrafraction motion monitoring is medically  necessary to ensure accurate and safe treatment delivery. The ability to quantify intrafraction motion without excessive ionizing radiation dose can only be performed with optical surface tracking. Accordingly, surface imaging offers the opportunity to obtain 3D measurements of patient position throughout IMRT and 3D treatments without excessive radiation exposure. I am ordering optical surface tracking for this patient's upcoming course of radiotherapy.  ________________________________   Reference:  Ursula Alert, J, et al. Surface imaging-based analysis of intrafraction motion for breast radiotherapy patients.Journal of [place], n. 6, nov. 2014. ISSN [id number].  Available at: <http://www.jacmp.org/index.php/jacmp/article/view/4957>.    -----------------------------------  Eppie Gibson, MD    This document serves as a record of services personally performed by Eppie Gibson , MD. It was created on her behalf by Truddie Hidden, a trained medical scribe. The creation of this record is based on the scribe's personal observations and the provider's statements to them. This document has been checked and approved by the attending provider.

## 2016-07-13 NOTE — Progress Notes (Signed)
Location of Breast Cancer: Right Breast  Histology per Pathology Report:  12/04/15 Diagnosis Breast, right, needle core biopsy, upper outer quadrant, 10:00 o'clock - INVASIVE DUCTAL CARCINOMA. - DUCTAL CARCINOMA IN SITU. - SEE MICROSCOPIC DESCRIPTION.  Receptor Status: ER(NEG), PR (NEG), Her2-neu (NEG), Ki-(40%)  01/20/16 Diagnosis 1. Breast, lumpectomy, Right INVASIVE DUCTAL CARCINOMA, GRADE 3 DUCTAL CARCINOMA IN SITU ALL MARGINS OF RESECTION ARE NEGATIVE FOR TUMOR PERIPHERAL INTRADUCTAL PAPILLOMA PERVIOUS BIOPSY SITE 2. Lymph node, sentinel, biopsy, Right axillary #1 ONE LYMPH NODE, NEGATIVE FOR CARCINOMA (0/1) 3. Lymph node, sentinel, biopsy, Right axillary #2 ONE LYMPH NODE, NEGATIVE FOR CARCINOMA (0/1)  BRCA2 Mutation Positive   Did patient present with symptoms or was this found on screening mammography?: It was found on a screening mammogram.   Past/Anticipated interventions by surgeon, if any: 01/20/16 Procedure(s): RIGHT BREAST PARTIAL MASTECTOMY WITH RADIOACTIVE SEED AND SENTINEL LYMPH NODE BIOPSY INSERTION PORT-A-CATH LEFT SUBCLAVIAN VEIN (8 FR)  Surgeon(s): Coralie Keens, MD  Past/Anticipated interventions by medical oncology, if any:  Dr. Lindi Adie last saw her 06/23/16 with the following plan: Current Treatment: Completed 4 cycles of Dose dense Adriamycin and Cytoxan ; today is cycle 11/12 Abraxane BRCA2: Recommended Bil Salpingo oophorectomy  Lymphedema issues, if any:   no  Pain issues, if any:  no  SAFETY ISSUES: No  Prior radiation? NO  Pacemaker/ICD? NO  Possible current pregnancy? No,hysterectomy  Is the patient on methotrexate?   Current Complaints / other details:  Widow, GXP5, menarche 28, age 25st pregnancy age 44,  Mother breast cancer, living dx 32 years ago, BP 106/67 (BP Location: Left Arm, Patient Position: Sitting, Cuff Size: Normal)   Pulse 95   Temp 98.2 F (36.8 C) (Oral)   Resp 16   Ht 5' 1.5" (1.562 m)   Wt 165 lb 3.2 oz  (74.9 kg)   SpO2 98%   BMI 30.71 kg/m   Wt Readings from Last 3 Encounters:  07/13/16 165 lb 3.2 oz (74.9 kg)  06/27/16 160 lb 12 oz (72.9 kg)  06/23/16 167 lb 11.2 oz (76.1 kg)      Rebecca Eaton, RN 07/13/2016,9:34 AM

## 2016-07-19 DIAGNOSIS — Z510 Encounter for antineoplastic radiation therapy: Secondary | ICD-10-CM | POA: Diagnosis not present

## 2016-07-22 ENCOUNTER — Ambulatory Visit
Admission: RE | Admit: 2016-07-22 | Discharge: 2016-07-22 | Disposition: A | Discharge status: Home / Self Care | Payer: Medicaid Other | Source: Ambulatory Visit | Attending: Radiation Oncology | Admitting: Radiation Oncology

## 2016-07-22 ENCOUNTER — Encounter: Payer: Self-pay | Admitting: *Deleted

## 2016-07-22 DIAGNOSIS — Z510 Encounter for antineoplastic radiation therapy: Secondary | ICD-10-CM | POA: Diagnosis not present

## 2016-07-25 ENCOUNTER — Encounter: Payer: Self-pay | Admitting: Radiation Oncology

## 2016-07-25 ENCOUNTER — Ambulatory Visit
Admission: RE | Admit: 2016-07-25 | Discharge: 2016-07-25 | Disposition: A | Discharge status: Home / Self Care | Payer: Medicaid Other | Source: Ambulatory Visit | Attending: Radiation Oncology | Admitting: Radiation Oncology

## 2016-07-25 ENCOUNTER — Ambulatory Visit: Payer: Medicaid Other

## 2016-07-25 VITALS — BP 105/67 | HR 96 | Temp 97.6°F | Ht 61.5 in | Wt 163.8 lb

## 2016-07-25 DIAGNOSIS — C50411 Malignant neoplasm of upper-outer quadrant of right female breast: Secondary | ICD-10-CM

## 2016-07-25 DIAGNOSIS — Z510 Encounter for antineoplastic radiation therapy: Secondary | ICD-10-CM | POA: Diagnosis not present

## 2016-07-25 MED ORDER — ALRA NON-METALLIC DEODORANT (RAD-ONC)
1.0000 "application " | Freq: Once | TOPICAL | Status: AC
  Administered 2016-07-25: 1 via TOPICAL

## 2016-07-25 MED ORDER — RADIAPLEXRX EX GEL
Freq: Once | CUTANEOUS | Status: AC
  Administered 2016-07-25: 17:00:00 via TOPICAL

## 2016-07-25 NOTE — Progress Notes (Signed)
    Weekly Management Note:  Outpatient    ICD-9-CM ICD-10-CM   1. Breast cancer of upper-outer quadrant of right female breast ([facility]) 174.4 C50.411     Current Dose:  2 Gy  Projected Dose: 60 Gy   Narrative:  The patient presents for routine under treatment assessment.  CBCT/MVCT images/Port film x-rays were reviewed.  The chart was checked. Dry skin on her back.  Physical Findings:  height is 5' 1.5" (1.562 m) and weight is 163 lb 12.8 oz (74.3 kg). Her temperature is 97.6 F (36.4 C). Her blood pressure is 105/67 and her pulse is 96.   Wt Readings from Last 3 Encounters:  07/25/16 163 lb 12.8 oz (74.3 kg)  07/13/16 165 lb 3.2 oz (74.9 kg)  06/27/16 160 lb 12 oz (72.9 kg)   Skin is dry on her back, outside RT fields.  Right breast without irritation.   Impression:  The patient is tolerating radiotherapy.  Plan:  Continue radiotherapy as planned. Patient instructed to apply Radiplex to intact skin in treatment fields. Apply lotion over back with help.    ________________________________   Eppie Gibson, M.D.

## 2016-07-25 NOTE — Progress Notes (Signed)
Ms. Ciano presents for her 1st fraction of radiation to her Right Breast. She denies pain or fatigue. She was provided education today, and Radiaplex cream to start today. She verbalized her understanding.   BP 105/67   Pulse 96   Temp 97.6 F (36.4 C)   Ht 5' 1.5" (1.562 m)   Wt 163 lb 12.8 oz (74.3 kg)   BMI 30.45 kg/m

## 2016-07-25 NOTE — Progress Notes (Signed)
Pt here for patient teaching.  Pt given Radiation and You booklet, skin care instructions, Alra deodorant and Radiaplex gel. Pt reports they have not watched the Radiation Therapy Education video, but were given the link to watch at home.  Reviewed areas of pertinence such as fatigue, skin changes, breast tenderness, breast swelling, cough, shortness of breath, earaches and taste changes . Pt able to give teach back of to pat skin, use unscented/gentle soap and drink plenty of water,apply Sonafine bid. Pt verbalizes understanding of information given and will contact nursing with any questions or concerns.     Http://rtanswers.org/treatmentinformation/whattoexpect/index

## 2016-07-26 ENCOUNTER — Ambulatory Visit
Admission: RE | Admit: 2016-07-26 | Discharge: 2016-07-26 | Disposition: A | Discharge status: Home / Self Care | Payer: Medicaid Other | Source: Ambulatory Visit | Attending: Radiation Oncology | Admitting: Radiation Oncology

## 2016-07-26 DIAGNOSIS — Z510 Encounter for antineoplastic radiation therapy: Secondary | ICD-10-CM | POA: Diagnosis not present

## 2016-07-27 ENCOUNTER — Ambulatory Visit
Admission: RE | Admit: 2016-07-27 | Discharge: 2016-07-27 | Disposition: A | Discharge status: Home / Self Care | Payer: Medicaid Other | Source: Ambulatory Visit | Attending: Radiation Oncology | Admitting: Radiation Oncology

## 2016-07-27 DIAGNOSIS — Z510 Encounter for antineoplastic radiation therapy: Secondary | ICD-10-CM | POA: Diagnosis not present

## 2016-07-28 ENCOUNTER — Ambulatory Visit
Admission: RE | Admit: 2016-07-28 | Discharge: 2016-07-28 | Disposition: A | Discharge status: Home / Self Care | Payer: Medicaid Other | Source: Ambulatory Visit | Attending: Radiation Oncology | Admitting: Radiation Oncology

## 2016-07-28 DIAGNOSIS — Z510 Encounter for antineoplastic radiation therapy: Secondary | ICD-10-CM | POA: Diagnosis not present

## 2016-07-29 ENCOUNTER — Ambulatory Visit
Admission: RE | Admit: 2016-07-29 | Discharge: 2016-07-29 | Disposition: A | Discharge status: Home / Self Care | Payer: Medicaid Other | Source: Ambulatory Visit | Attending: Radiation Oncology | Admitting: Radiation Oncology

## 2016-07-29 DIAGNOSIS — Z510 Encounter for antineoplastic radiation therapy: Secondary | ICD-10-CM | POA: Diagnosis not present

## 2016-08-01 ENCOUNTER — Ambulatory Visit
Admission: RE | Admit: 2016-08-01 | Discharge: 2016-08-01 | Disposition: A | Discharge status: Home / Self Care | Payer: Medicaid Other | Source: Ambulatory Visit | Attending: Radiation Oncology | Admitting: Radiation Oncology

## 2016-08-01 ENCOUNTER — Encounter: Payer: Self-pay | Admitting: Radiation Oncology

## 2016-08-01 VITALS — BP 106/84 | HR 87 | Temp 97.8°F | Ht 61.5 in | Wt 163.9 lb

## 2016-08-01 DIAGNOSIS — Z510 Encounter for antineoplastic radiation therapy: Secondary | ICD-10-CM | POA: Diagnosis not present

## 2016-08-01 DIAGNOSIS — C50411 Malignant neoplasm of upper-outer quadrant of right female breast: Secondary | ICD-10-CM

## 2016-08-01 NOTE — Progress Notes (Signed)
    Weekly Management Note:  Outpatient    ICD-9-CM ICD-10-CM   1. Breast cancer of upper-outer quadrant of right female breast ([facility]) 174.4 C50.411     Current Dose:  12 Gy  Projected Dose: 60 Gy   Narrative:  The patient presents for routine under treatment assessment.  CBCT/MVCT images/Port film x-rays were reviewed.  The chart was checked.  She is pain free. Noted a knot on upper right abdomen wall. Dry skin over back.  Physical Findings:  height is 5' 1.5" (1.562 m) and weight is 163 lb 14.4 oz (74.3 kg). Her temperature is 97.8 F (36.6 C). Her blood pressure is 106/84 and her pulse is 87.   Wt Readings from Last 3 Encounters:  08/01/16 163 lb 14.4 oz (74.3 kg)  07/25/16 163 lb 12.8 oz (74.3 kg)  07/13/16 165 lb 3.2 oz (74.9 kg)   Skin is dry on her back, outside RT fields.  Right breast without irritation.  Cyst-like growth on upper right abdominal wall, not a clinical concern.  Impression:  The patient is tolerating radiotherapy.  Plan:  Continue radiotherapy as planned. Patient instructed to apply Radiplex to intact skin in treatment fields. Apply lotion over back with help.    ________________________________   Eppie Gibson, M.D.

## 2016-08-01 NOTE — Progress Notes (Signed)
Alison [name] presents for her 6th fraction of radiation to her Right Breast. She denies pain. She does report an occasional sharp, quick pain in her Left side. The skin to her Right Breast is normal appearing, and she is using the Radiaplex cream occasionally. I have asked her to begin using it twice daily, and I will write a note to her group home that she can use it twice daily.  She does point out to me a nodule several inches below her Right Breast. It is firm, but Alison [name] denies any pain when palpated. She also has concerns about the dry skin over her back, and how to treat it.   BP 106/84   Pulse 87   Temp 97.8 F (36.6 C)   Ht 5' 1.5" (1.562 m)   Wt 163 lb 14.4 oz (74.3 kg)   BMI 30.47 kg/m

## 2016-08-02 ENCOUNTER — Ambulatory Visit
Admission: RE | Admit: 2016-08-02 | Discharge: 2016-08-02 | Disposition: A | Discharge status: Home / Self Care | Payer: Medicaid Other | Source: Ambulatory Visit | Attending: Radiation Oncology | Admitting: Radiation Oncology

## 2016-08-02 DIAGNOSIS — Z510 Encounter for antineoplastic radiation therapy: Secondary | ICD-10-CM | POA: Diagnosis not present

## 2016-08-03 ENCOUNTER — Ambulatory Visit
Admission: RE | Admit: 2016-08-03 | Discharge: 2016-08-03 | Disposition: A | Discharge status: Home / Self Care | Payer: Medicaid Other | Source: Ambulatory Visit | Attending: Radiation Oncology | Admitting: Radiation Oncology

## 2016-08-03 DIAGNOSIS — Z510 Encounter for antineoplastic radiation therapy: Secondary | ICD-10-CM | POA: Diagnosis not present

## 2016-08-04 ENCOUNTER — Ambulatory Visit
Admission: RE | Admit: 2016-08-04 | Discharge: 2016-08-04 | Disposition: A | Discharge status: Home / Self Care | Payer: Medicaid Other | Source: Ambulatory Visit | Attending: Radiation Oncology | Admitting: Radiation Oncology

## 2016-08-04 ENCOUNTER — Telehealth: Payer: Self-pay | Admitting: *Deleted

## 2016-08-04 DIAGNOSIS — Z510 Encounter for antineoplastic radiation therapy: Secondary | ICD-10-CM | POA: Diagnosis not present

## 2016-08-04 NOTE — Telephone Encounter (Signed)
  Oncology Nurse Navigator Documentation    Navigator Encounter Type: Telephone (Called pt to assess needs during xrt) (08/04/16 1400) Telephone: Outgoing Call;Patient Update (08/04/16 1400)  Unable to leave msg. Will call again       Patient Visit Type: RadOnc (08/04/16 1400) Treatment Phase: First Radiation Tx (08/04/16 1400)                            Time Spent with Patient: 15 (08/04/16 1400)

## 2016-08-05 ENCOUNTER — Ambulatory Visit
Admission: RE | Admit: 2016-08-05 | Discharge: 2016-08-05 | Disposition: A | Discharge status: Home / Self Care | Payer: Medicaid Other | Source: Ambulatory Visit | Attending: Radiation Oncology | Admitting: Radiation Oncology

## 2016-08-05 DIAGNOSIS — Z510 Encounter for antineoplastic radiation therapy: Secondary | ICD-10-CM | POA: Diagnosis not present

## 2016-08-08 ENCOUNTER — Encounter: Payer: Self-pay | Admitting: Radiation Oncology

## 2016-08-08 ENCOUNTER — Ambulatory Visit
Admission: RE | Admit: 2016-08-08 | Discharge: 2016-08-08 | Disposition: A | Discharge status: Home / Self Care | Payer: Medicaid Other | Source: Ambulatory Visit | Attending: Radiation Oncology | Admitting: Radiation Oncology

## 2016-08-08 VITALS — BP 108/75 | HR 86 | Temp 98.3°F | Ht 61.5 in | Wt 166.6 lb

## 2016-08-08 DIAGNOSIS — Z510 Encounter for antineoplastic radiation therapy: Secondary | ICD-10-CM | POA: Diagnosis not present

## 2016-08-08 DIAGNOSIS — C50411 Malignant neoplasm of upper-outer quadrant of right female breast: Secondary | ICD-10-CM

## 2016-08-08 NOTE — Progress Notes (Signed)
    Weekly Management Note:  Outpatient    ICD-9-CM ICD-10-CM   1. Breast cancer of upper-outer quadrant of right female breast ([facility]) 174.4 C50.411     Current Dose:  22 Gy  Projected Dose: 60 Gy   Narrative:  The patient presents for routine under treatment assessment.  CBCT/MVCT images/Port film x-rays were reviewed.  The chart was checked.  She is doing well.  Physical Findings:  height is 5' 1.5" (1.562 m) and weight is 166 lb 9.6 oz (75.6 kg). Her temperature is 98.3 F (36.8 C). Her blood pressure is 108/75 and her pulse is 86.   Wt Readings from Last 3 Encounters:  08/08/16 166 lb 9.6 oz (75.6 kg)  08/01/16 163 lb 14.4 oz (74.3 kg)  07/25/16 163 lb 12.8 oz (74.3 kg)    Right breast without irritation.     Impression:  The patient is tolerating radiotherapy.  Plan:  Continue radiotherapy as planned. Patient instructed to apply Radiplex to intact skin in treatment fields.    ________________________________   Eppie Gibson, M.D.

## 2016-08-08 NOTE — Progress Notes (Signed)
Alison [name] is here for her 11th fraction of radiation to her Right Breast. She denies pain or fatigue. Her Right Breast is slightly hyperpigmented and some swelling is noted. She is using the radiaplex cream twice daily.   BP 108/75   Pulse 86   Temp 98.3 F (36.8 C)   Ht 5' 1.5" (1.562 m)   Wt 166 lb 9.6 oz (75.6 kg)   BMI 30.97 kg/m    Wt Readings from Last 3 Encounters:  08/08/16 166 lb 9.6 oz (75.6 kg)  08/01/16 163 lb 14.4 oz (74.3 kg)  07/25/16 163 lb 12.8 oz (74.3 kg)

## 2016-08-09 ENCOUNTER — Encounter: Payer: Self-pay | Admitting: *Deleted

## 2016-08-09 ENCOUNTER — Ambulatory Visit
Admission: RE | Admit: 2016-08-09 | Discharge: 2016-08-09 | Disposition: A | Discharge status: Home / Self Care | Payer: Medicaid Other | Source: Ambulatory Visit | Attending: Radiation Oncology | Admitting: Radiation Oncology

## 2016-08-09 DIAGNOSIS — Z510 Encounter for antineoplastic radiation therapy: Secondary | ICD-10-CM | POA: Diagnosis not present

## 2016-08-10 ENCOUNTER — Ambulatory Visit
Admission: RE | Admit: 2016-08-10 | Discharge: 2016-08-10 | Disposition: A | Discharge status: Home / Self Care | Payer: Medicaid Other | Source: Ambulatory Visit | Attending: Radiation Oncology | Admitting: Radiation Oncology

## 2016-08-10 ENCOUNTER — Telehealth: Payer: Self-pay

## 2016-08-10 DIAGNOSIS — Z510 Encounter for antineoplastic radiation therapy: Secondary | ICD-10-CM | POA: Diagnosis not present

## 2016-08-10 NOTE — Telephone Encounter (Signed)
I spoke to Alison [name] and her caregiver at her Group Home. I informed them that Alison [name] has had an exposure to the Flu virus while receiving Radiation therapy. I have offered to call in Tamiflu 75 mg for seven days and they agreed. I have called in the prescription to [facility]. I have sent a letter home with Alison [name] explaining the order and reason for the prescription.

## 2016-08-11 ENCOUNTER — Ambulatory Visit
Admission: RE | Admit: 2016-08-11 | Discharge: 2016-08-11 | Disposition: A | Discharge status: Home / Self Care | Payer: Medicaid Other | Source: Ambulatory Visit | Attending: Radiation Oncology | Admitting: Radiation Oncology

## 2016-08-11 DIAGNOSIS — Z510 Encounter for antineoplastic radiation therapy: Secondary | ICD-10-CM | POA: Diagnosis not present

## 2016-08-12 ENCOUNTER — Ambulatory Visit
Admission: RE | Admit: 2016-08-12 | Discharge: 2016-08-12 | Disposition: A | Discharge status: Home / Self Care | Payer: Medicaid Other | Source: Ambulatory Visit | Attending: Radiation Oncology | Admitting: Radiation Oncology

## 2016-08-12 DIAGNOSIS — Z510 Encounter for antineoplastic radiation therapy: Secondary | ICD-10-CM | POA: Diagnosis not present

## 2016-08-16 ENCOUNTER — Ambulatory Visit
Admission: RE | Admit: 2016-08-16 | Discharge: 2016-08-16 | Disposition: A | Discharge status: Home / Self Care | Payer: Medicaid Other | Source: Ambulatory Visit | Attending: Radiation Oncology | Admitting: Radiation Oncology

## 2016-08-16 ENCOUNTER — Other Ambulatory Visit: Payer: Self-pay | Admitting: Cardiology

## 2016-08-16 DIAGNOSIS — Z510 Encounter for antineoplastic radiation therapy: Secondary | ICD-10-CM | POA: Diagnosis not present

## 2016-08-17 ENCOUNTER — Encounter: Payer: Self-pay | Admitting: Radiation Oncology

## 2016-08-17 ENCOUNTER — Ambulatory Visit
Admission: RE | Admit: 2016-08-17 | Discharge: 2016-08-17 | Disposition: A | Discharge status: Home / Self Care | Payer: Medicaid Other | Source: Ambulatory Visit | Attending: Radiation Oncology | Admitting: Radiation Oncology

## 2016-08-17 VITALS — BP 106/87 | HR 82 | Temp 97.7°F | Wt 162.8 lb

## 2016-08-17 DIAGNOSIS — C50411 Malignant neoplasm of upper-outer quadrant of right female breast: Secondary | ICD-10-CM

## 2016-08-17 DIAGNOSIS — Z510 Encounter for antineoplastic radiation therapy: Secondary | ICD-10-CM | POA: Diagnosis not present

## 2016-08-17 NOTE — Progress Notes (Signed)
    Weekly Management Note:  Outpatient    ICD-9-CM ICD-10-CM   1. Breast cancer of upper-outer quadrant of right female breast (HCC) 174.4 C50.411     Current Dose:  34 Gy  Projected Dose: 60 Gy   Narrative:  The patient presents for routine under treatment assessment.  CBCT/MVCT images/Port film x-rays were reviewed.  The chart was checked.  She is doing well. Ms. Whitsel is here for her 17th fraction of radiation to her right breast. She denies pain or fatigue. Her right breast is slightly hyperpigmented to her axilla area. She tells me she uses Radiaplex bid. She has no other concerns at this time.  Physical Findings:  weight is 162 lb 12.8 oz (73.8 kg). Her temperature is 97.7 F (36.5 C). Her blood pressure is 106/87 and her pulse is 82.   Wt Readings from Last 3 Encounters:  08/17/16 162 lb 12.8 oz (73.8 kg)  08/08/16 166 lb 9.6 oz (75.6 kg)  08/01/16 163 lb 14.4 oz (74.3 kg)   Right breast is   hyperpigmented in the axilla. Skin is intact.    Impression:  The patient is tolerating radiotherapy.  Plan:  Continue radiotherapy as planned.  ________________________________   Eppie Gibson, M.D.   This document serves as a record of services personally performed by Eppie Gibson, MD. It was created on her behalf by Bethann Humble, a trained medical scribe. The creation of this record is based on the scribe's personal observations and the provider's statements to them. This document has been checked and approved by the attending provider.

## 2016-08-17 NOTE — Progress Notes (Signed)
Alison [name] is here for her 17th fraction of radiation to her Right Breast. She denies pain or fatigue. Her Right Breast is slightly hyperpigmented to her axilla area. She tells me she is using the radiaplex twice daily. She has no other concerns at this time.   BP 106/87   Pulse 82   Temp 97.7 F (36.5 C)   Wt 162 lb 12.8 oz (73.8 kg)   BMI 30.26 kg/m    Wt Readings from Last 3 Encounters:  08/17/16 162 lb 12.8 oz (73.8 kg)  08/08/16 166 lb 9.6 oz (75.6 kg)  08/01/16 163 lb 14.4 oz (74.3 kg)

## 2016-08-18 ENCOUNTER — Ambulatory Visit
Admission: RE | Admit: 2016-08-18 | Discharge: 2016-08-18 | Disposition: A | Discharge status: Home / Self Care | Payer: Medicaid Other | Source: Ambulatory Visit | Attending: Radiation Oncology | Admitting: Radiation Oncology

## 2016-08-18 ENCOUNTER — Other Ambulatory Visit: Payer: Self-pay | Admitting: Hematology

## 2016-08-18 DIAGNOSIS — Z510 Encounter for antineoplastic radiation therapy: Secondary | ICD-10-CM | POA: Diagnosis not present

## 2016-08-19 ENCOUNTER — Ambulatory Visit
Admission: RE | Admit: 2016-08-19 | Discharge: 2016-08-19 | Disposition: A | Discharge status: Home / Self Care | Payer: Medicaid Other | Source: Ambulatory Visit | Attending: Radiation Oncology | Admitting: Radiation Oncology

## 2016-08-19 DIAGNOSIS — Z510 Encounter for antineoplastic radiation therapy: Secondary | ICD-10-CM | POA: Diagnosis not present

## 2016-08-22 ENCOUNTER — Ambulatory Visit
Admission: RE | Admit: 2016-08-22 | Discharge: 2016-08-22 | Disposition: A | Discharge status: Home / Self Care | Payer: Medicaid Other | Source: Ambulatory Visit | Attending: Radiation Oncology | Admitting: Radiation Oncology

## 2016-08-22 DIAGNOSIS — Z510 Encounter for antineoplastic radiation therapy: Secondary | ICD-10-CM | POA: Diagnosis not present

## 2016-08-23 ENCOUNTER — Ambulatory Visit
Admission: RE | Admit: 2016-08-23 | Discharge: 2016-08-23 | Disposition: A | Discharge status: Home / Self Care | Payer: Medicaid Other | Source: Ambulatory Visit | Attending: Radiation Oncology | Admitting: Radiation Oncology

## 2016-08-23 DIAGNOSIS — Z510 Encounter for antineoplastic radiation therapy: Secondary | ICD-10-CM | POA: Diagnosis not present

## 2016-08-24 ENCOUNTER — Ambulatory Visit
Admission: RE | Admit: 2016-08-24 | Discharge: 2016-08-24 | Disposition: A | Discharge status: Home / Self Care | Payer: Medicaid Other | Source: Ambulatory Visit | Attending: Radiation Oncology | Admitting: Radiation Oncology

## 2016-08-24 ENCOUNTER — Encounter: Payer: Self-pay | Admitting: Radiation Oncology

## 2016-08-24 VITALS — BP 101/88 | HR 88 | Temp 98.3°F | Resp 18 | Ht 61.5 in | Wt 165.7 lb

## 2016-08-24 DIAGNOSIS — Z510 Encounter for antineoplastic radiation therapy: Secondary | ICD-10-CM | POA: Diagnosis not present

## 2016-08-24 DIAGNOSIS — C50411 Malignant neoplasm of upper-outer quadrant of right female breast: Secondary | ICD-10-CM

## 2016-08-24 MED ORDER — RADIAPLEXRX EX GEL
Freq: Once | CUTANEOUS | Status: AC
  Administered 2016-08-24: 16:00:00 via TOPICAL

## 2016-08-24 NOTE — Progress Notes (Addendum)
Ms. Westbay is here for her 22th fraction of radiation to her Right Breast. She denies pain or fatigue. Her Right Breast is slightly hyperpigmented to her axilla area. She tells me she is using the radiaplex twice daily. She has no other concerns at this time.  Had not had very much to drink today.  Encouraged to increase her fluid intake the rest of the day.  Given a diet ginger ale.  Given a new Radiaplex gel today.  Wt Readings from Last 3 Encounters:  08/24/16 165 lb 11.2 oz (75.2 kg)  08/17/16 162 lb 12.8 oz (73.8 kg)  08/08/16 166 lb 9.6 oz (75.6 kg)  BP 101/88 (BP Location: Left Arm, Patient Position: Sitting, Cuff Size: Normal)   Pulse 88   Temp 98.3 F (36.8 C) (Oral)   Resp 18   Ht 5' 1.5" (1.562 m)   Wt 165 lb 11.2 oz (75.2 kg)   SpO2 98%   BMI 30.80 kg/m   315 p.Marland Kitchenm.  97/66 85 325 p.m.   101/88 88

## 2016-08-24 NOTE — Progress Notes (Signed)
    Weekly Management Note:  Outpatient    ICD-9-CM ICD-10-CM   1. Breast cancer of upper-outer quadrant of right female breast (HCC) 174.4 C50.411 hyaluronate sodium (RADIAPLEXRX) gel    Current Dose:  44 Gy  Projected Dose: 60 Gy   Narrative:  The patient presents for routine under treatment assessment.  CBCT/MVCT images/Port film x-rays were reviewed.  The chart was checked.  She is doing well.  Ms. Parmar is here for her 22nd fraction of radiation to her right breast. She denies pain or fatigue. Her right breast is slightly hyperpigmented to her axilla area. She states she is using Radiaplex twice daily. She has no other concerns at this time. She has not had much to drink today. She was encouraged by nurses to increase her fluid intake the rest of the day. She was given a diet ginger ale. She was also given new Radiaplex gel today.  Physical Findings:  height is 5' 1.5" (1.562 m) and weight is 165 lb 11.2 oz (75.2 kg). Her oral temperature is 98.3 F (36.8 C). Her blood pressure is 101/88 and her pulse is 88. Her respiration is 18 and oxygen saturation is 98%.   Wt Readings from Last 3 Encounters:  08/24/16 165 lb 11.2 oz (75.2 kg)  08/17/16 162 lb 12.8 oz (73.8 kg)  08/08/16 166 lb 9.6 oz (75.6 kg)   Hyperpigmentation throughout right axilla and breast, but the skin is intact.    Impression:  The patient is tolerating radiotherapy.  Plan:  Continue radiotherapy as planned. Patient was informed to apply Radiaplex under her arm and throughout her breast. ________________________________   Eppie Gibson, M.D.   This document serves as a record of services personally performed by Eppie Gibson, MD. It was created on her behalf by Bethann Humble, a trained medical scribe. The creation of this record is based on the scribe's personal observations and the provider's statements to them. This document has been checked and approved by the attending provider.

## 2016-08-25 ENCOUNTER — Ambulatory Visit
Admission: RE | Admit: 2016-08-25 | Discharge: 2016-08-25 | Disposition: A | Discharge status: Home / Self Care | Payer: Medicaid Other | Source: Ambulatory Visit | Attending: Radiation Oncology | Admitting: Radiation Oncology

## 2016-08-25 DIAGNOSIS — Z510 Encounter for antineoplastic radiation therapy: Secondary | ICD-10-CM | POA: Diagnosis not present

## 2016-08-26 ENCOUNTER — Encounter: Payer: Self-pay | Admitting: Radiation Oncology

## 2016-08-26 ENCOUNTER — Ambulatory Visit
Admission: RE | Admit: 2016-08-26 | Discharge: 2016-08-26 | Disposition: A | Discharge status: Home / Self Care | Payer: Medicaid Other | Source: Ambulatory Visit | Attending: Radiation Oncology | Admitting: Radiation Oncology

## 2016-08-26 DIAGNOSIS — Z510 Encounter for antineoplastic radiation therapy: Secondary | ICD-10-CM | POA: Diagnosis not present

## 2016-08-29 ENCOUNTER — Encounter: Payer: Self-pay | Admitting: Radiation Oncology

## 2016-08-29 ENCOUNTER — Ambulatory Visit: Payer: Medicaid Other | Admitting: Radiation Oncology

## 2016-08-29 ENCOUNTER — Ambulatory Visit
Admission: RE | Admit: 2016-08-29 | Discharge: 2016-08-29 | Disposition: A | Discharge status: Home / Self Care | Payer: Medicaid Other | Source: Ambulatory Visit | Attending: Radiation Oncology | Admitting: Radiation Oncology

## 2016-08-29 VITALS — BP 104/72 | HR 86 | Temp 97.7°F | Ht 61.5 in | Wt 166.2 lb

## 2016-08-29 DIAGNOSIS — C50411 Malignant neoplasm of upper-outer quadrant of right female breast: Secondary | ICD-10-CM

## 2016-08-29 DIAGNOSIS — Z510 Encounter for antineoplastic radiation therapy: Secondary | ICD-10-CM | POA: Diagnosis not present

## 2016-08-29 NOTE — Progress Notes (Signed)
Ms. Quito is here for her 25th fraction of radiation to her Right Breast. She denies fatigue or fatigue. Her Right Breast has hyperpigmentation to her Axilla area and underneath her Right Breast. Her Right Breast is slightly tender and swollen. She is using the Radiaplex twice daily as directed.   BP 104/72   Pulse 86   Temp 97.7 F (36.5 C)   Ht 5' 1.5" (1.562 m)   Wt 166 lb 3.2 oz (75.4 kg)   BMI 30.89 kg/m    Wt Readings from Last 3 Encounters:  08/29/16 166 lb 3.2 oz (75.4 kg)  08/24/16 165 lb 11.2 oz (75.2 kg)  08/17/16 162 lb 12.8 oz (73.8 kg)

## 2016-08-29 NOTE — Progress Notes (Signed)
    Weekly Management Note:  Outpatient    ICD-9-CM ICD-10-CM   1. Breast cancer of upper-outer quadrant of right female breast ([place]) 174.4 C50.411     Current Dose:  50Gy   Projected Dose: 60 Gy   Narrative:  The patient presents for routine under treatment assessment.  CBCT/MVCT images/Port film x-rays were reviewed.  The chart was checked.  She is doing well. No new concerns  Physical Findings:  height is 5' 1.5" (1.562 m) and weight is 166 lb 3.2 oz (75.4 kg). Her temperature is 97.7 F (36.5 C). Her blood pressure is 104/72 and her pulse is 86.   Wt Readings from Last 3 Encounters:  08/29/16 166 lb 3.2 oz (75.4 kg)  08/24/16 165 lb 11.2 oz (75.2 kg)  08/17/16 162 lb 12.8 oz (73.8 kg)   Hyperpigmentation throughout right axilla and breast, but the skin is still intact.    Impression:  The patient is tolerating radiotherapy.  Plan:  Continue radiotherapy as planned. Continue to apply Radiaplex under her arm and throughout her breast. ________________________________   Eppie Gibson, M.D.

## 2016-08-30 ENCOUNTER — Ambulatory Visit
Admission: RE | Admit: 2016-08-30 | Discharge: 2016-08-30 | Disposition: A | Discharge status: Home / Self Care | Payer: Medicaid Other | Source: Ambulatory Visit | Attending: Radiation Oncology | Admitting: Radiation Oncology

## 2016-08-30 ENCOUNTER — Encounter: Payer: Self-pay | Admitting: *Deleted

## 2016-08-30 DIAGNOSIS — Z510 Encounter for antineoplastic radiation therapy: Secondary | ICD-10-CM | POA: Diagnosis not present

## 2016-08-31 ENCOUNTER — Ambulatory Visit
Admission: RE | Admit: 2016-08-31 | Discharge: 2016-08-31 | Disposition: A | Discharge status: Home / Self Care | Payer: Medicaid Other | Source: Ambulatory Visit | Attending: Radiation Oncology | Admitting: Radiation Oncology

## 2016-08-31 DIAGNOSIS — Z510 Encounter for antineoplastic radiation therapy: Secondary | ICD-10-CM | POA: Diagnosis not present

## 2016-09-01 ENCOUNTER — Ambulatory Visit
Admission: RE | Admit: 2016-09-01 | Discharge: 2016-09-01 | Disposition: A | Discharge status: Home / Self Care | Payer: Medicaid Other | Source: Ambulatory Visit | Attending: Radiation Oncology | Admitting: Radiation Oncology

## 2016-09-01 DIAGNOSIS — Z510 Encounter for antineoplastic radiation therapy: Secondary | ICD-10-CM | POA: Diagnosis not present

## 2016-09-02 ENCOUNTER — Ambulatory Visit
Admission: RE | Admit: 2016-09-02 | Discharge: 2016-09-02 | Disposition: A | Discharge status: Home / Self Care | Payer: Medicaid Other | Source: Ambulatory Visit | Attending: Radiation Oncology | Admitting: Radiation Oncology

## 2016-09-02 DIAGNOSIS — Z510 Encounter for antineoplastic radiation therapy: Secondary | ICD-10-CM | POA: Diagnosis not present

## 2016-09-05 ENCOUNTER — Ambulatory Visit
Admission: RE | Admit: 2016-09-05 | Discharge: 2016-09-05 | Disposition: A | Discharge status: Home / Self Care | Payer: Medicaid Other | Source: Ambulatory Visit | Attending: Radiation Oncology | Admitting: Radiation Oncology

## 2016-09-05 ENCOUNTER — Encounter: Payer: Self-pay | Admitting: Radiation Oncology

## 2016-09-05 VITALS — BP 84/66 | HR 94 | Temp 98.2°F | Resp 18 | Ht 61.5 in | Wt 160.2 lb

## 2016-09-05 DIAGNOSIS — Z510 Encounter for antineoplastic radiation therapy: Secondary | ICD-10-CM | POA: Diagnosis not present

## 2016-09-05 DIAGNOSIS — C50411 Malignant neoplasm of upper-outer quadrant of right female breast: Secondary | ICD-10-CM

## 2016-09-05 NOTE — Progress Notes (Signed)
    Weekly Management Note:  Outpatient    ICD-9-CM ICD-10-CM   1. Breast cancer of upper-outer quadrant of right female breast (HCC) 174.4 C50.411     Current Dose:  60 Gy   Projected Dose: 60 Gy   Narrative:  The patient presents for routine under treatment assessment.  CBCT/MVCT images/Port film x-rays were reviewed.  The chart was checked.  She is doing well.   Physical Findings:  height is 5' 1.5" (1.562 m) and weight is 160 lb 3.2 oz (72.7 kg). Her oral temperature is 98.2 F (36.8 C). Her blood pressure is 84/66 (abnormal) and her pulse is 94. Her respiration is 18 and oxygen saturation is 99%.   Wt Readings from Last 3 Encounters:  09/05/16 160 lb 3.2 oz (72.7 kg)  08/29/16 166 lb 3.2 oz (75.4 kg)  08/24/16 165 lb 11.2 oz (75.2 kg)   Hyperpigmentation throughout right axilla and breast; superficial peeling, dry, at IM fold  Impression:  The patient tolerated radiotherapy.  Plan:  Continue radiotherapy as planned. Continue to apply Radiaplex under her arm and throughout her breast. F/u in 37mo________________________________   SEppie Gibson M.D.

## 2016-09-05 NOTE — Progress Notes (Signed)
Alison [name] is here for her 30th fraction of radiation to her Right Breast. She denies pain or fatigue. Her Right Breast has dark  hyperpigmented to her right breast and axilla area. She is using the radiaplex twice daily. She has no other concerns at this time.  EOT instructions given for her skin to right breast, continue using Radiaplex gel for two weeks then use a lotion with vitamin E.  One month follow up card given to see Dr. Isidore Moos. Wt Readings from Last 3 Encounters:  09/05/16 160 lb 3.2 oz (72.7 kg)  08/29/16 166 lb 3.2 oz (75.4 kg)  08/24/16 165 lb 11.2 oz (75.2 kg)  BP (!) 84/66 (BP Location: Left Arm, Patient Position: Standing, Cuff Size: Normal)   Pulse 94   Temp 98.2 F (36.8 C) (Oral)   Resp 18   Ht 5' 1.5" (1.562 m)   Wt 160 lb 3.2 oz (72.7 kg)   SpO2 99%   BMI 29.78 kg/m

## 2016-09-06 ENCOUNTER — Telehealth: Payer: Self-pay | Admitting: *Deleted

## 2016-09-06 DIAGNOSIS — C50411 Malignant neoplasm of upper-outer quadrant of right female breast: Secondary | ICD-10-CM

## 2016-09-06 NOTE — Telephone Encounter (Signed)
  Oncology Nurse Navigator Documentation  Navigator Location: [facility] (09/06/16 1600) Navigator Encounter Type: Telephone (09/06/16 1600) Telephone: Outgoing Call (09/06/16 1600)         Patient Visit Type: POEUMP (09/06/16 1600) Treatment Phase: Final Radiation Tx (09/06/16 1600)     Interventions: Referrals (09/06/16 1600) Referrals: Survivorship (09/06/16 1600)          Acuity: Level 1 (09/06/16 1600)         Time Spent with Patient: 15 (09/06/16 1600)

## 2016-09-13 NOTE — Progress Notes (Signed)
  Radiation Oncology         ([phone] ________________________________  Name: Alison [name] MRN: [id number]  Date: 09/05/2016  DOB: Jun 29, 1961  End of Treatment Note  DIAGNOSIS:      ICD-9-CM ICD-10-CM   1. Breast cancer of upper-outer quadrant of right female breast ([place]) 174.4 C50.411    Indication for treatment:  curative       Radiation treatment dates:  07/25/2016-09/05/2016  Site/dose:   1) Right breast / 50 Gy in 25 fractions    2) Right breast boost / 10 Gy in 5 fractions  Beams/energy:   1) 3D / 10, 6 MV photons   2)  Photon boost  / 10, 15 MV photons  Narrative: The patient tolerated radiation treatment relatively well.      Plan: The patient has completed radiation treatment. The patient will return to radiation oncology clinic for routine followup in one month. I advised them to call or return sooner if they have any questions or concerns related to their recovery or treatment.  -----------------------------------  Eppie Gibson, MD

## 2016-09-23 ENCOUNTER — Telehealth: Payer: Self-pay | Admitting: *Deleted

## 2016-09-23 ENCOUNTER — Other Ambulatory Visit (HOSPITAL_BASED_OUTPATIENT_CLINIC_OR_DEPARTMENT_OTHER): Payer: Medicaid Other

## 2016-09-23 ENCOUNTER — Ambulatory Visit (HOSPITAL_BASED_OUTPATIENT_CLINIC_OR_DEPARTMENT_OTHER): Payer: Medicaid Other | Admitting: Hematology and Oncology

## 2016-09-23 ENCOUNTER — Encounter: Payer: Self-pay | Admitting: Hematology and Oncology

## 2016-09-23 DIAGNOSIS — C50411 Malignant neoplasm of upper-outer quadrant of right female breast: Secondary | ICD-10-CM | POA: Diagnosis not present

## 2016-09-23 DIAGNOSIS — Z171 Estrogen receptor negative status [ER-]: Secondary | ICD-10-CM | POA: Diagnosis not present

## 2016-09-23 LAB — CBC WITH DIFFERENTIAL/PLATELET
BASO%: 0.3 % (ref 0.0–2.0)
Basophils Absolute: 0 10*3/uL (ref 0.0–0.1)
EOS%: 2.4 % (ref 0.0–7.0)
Eosinophils Absolute: 0.1 10*3/uL (ref 0.0–0.5)
HCT: 34.2 % — ABNORMAL LOW (ref 34.8–46.6)
HGB: 11.4 g/dL — ABNORMAL LOW (ref 11.6–15.9)
LYMPH%: 27.2 % (ref 14.0–49.7)
MCH: 28.3 pg (ref 25.1–34.0)
MCHC: 33.3 g/dL (ref 31.5–36.0)
MCV: 85.1 fL (ref 79.5–101.0)
MONO#: 0.3 10*3/uL (ref 0.1–0.9)
MONO%: 11.6 % (ref 0.0–14.0)
NEUT#: 1.7 10*3/uL (ref 1.5–6.5)
NEUT%: 58.5 % (ref 38.4–76.8)
Platelets: 176 10*3/uL (ref 145–400)
RBC: 4.03 10*6/uL (ref 3.70–5.45)
RDW: 13.6 % (ref 11.2–14.5)
WBC: 3 10*3/uL — ABNORMAL LOW (ref 3.9–10.3)
lymph#: 0.8 10*3/uL — ABNORMAL LOW (ref 0.9–3.3)

## 2016-09-23 LAB — COMPREHENSIVE METABOLIC PANEL
ALT: 16 U/L (ref 0–55)
AST: 20 U/L (ref 5–34)
Albumin: 3.6 g/dL (ref 3.5–5.0)
Alkaline Phosphatase: 88 U/L (ref 40–150)
Anion Gap: 10 mEq/L (ref 3–11)
BUN: 11.5 mg/dL (ref 7.0–26.0)
CO2: 28 mEq/L (ref 22–29)
Calcium: 9.3 mg/dL (ref 8.4–10.4)
Chloride: 105 mEq/L (ref 98–109)
Creatinine: 0.8 mg/dL (ref 0.6–1.1)
EGFR: >90 mL/min/{1.73_m2} (ref >90)
Glucose: 120 mg/dl (ref 70–140)
Potassium: 4.2 mEq/L (ref 3.5–5.1)
Sodium: 143 mEq/L (ref 136–145)
Total Bilirubin: 0.25 mg/dL (ref 0.20–1.20)
Total Protein: 7.4 g/dL (ref 6.4–8.3)

## 2016-09-23 NOTE — Progress Notes (Signed)
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      01/20/2016 Surgery    Rt Lumpectomy: IDC 1.7 cm, grade 3, with DCIS, 0/2 LN, ER 0%, PR 0%, Her 2 Neg Ratio 1.35, Ki 67: 40%, T1CN0 (stage 1A)      02/11/2016 - 06/30/2016 Chemotherapy    Adjuvant chemotherapy with dose dense Adriamycin and Cytoxan 4 followed by Abraxane weekly 12      07/25/2016 - 09/05/2016 Radiation Therapy    Adjuvant radiation therapy       CHIEF COMPLIANT: Follow-up after radiation therapy is complete  INTERVAL HISTORY: Alison [name] is a 55 year old with above-mentioned history of right breast cancer who treated with lumpectomy adjuvant chemotherapy and radiation is currently on surveillance. She reports that she is healing very well from the prior radiation. Skin discoloration is lightening up. Denies any lumps or nodules in the breasts. Her hair is starting to come back. She tells me that her brother recently passed away from multiorgan dysfunction and failure.  REVIEW OF SYSTEMS:   Constitutional: Denies fevers, chills or abnormal weight loss Eyes: Denies blurriness of vision Ears, nose, mouth, throat, and face: Denies mucositis or sore throat Respiratory: Denies cough, dyspnea or wheezes Cardiovascular: Denies palpitation, chest  discomfort Gastrointestinal:  Denies nausea, heartburn or change in bowel habits Skin: Denies abnormal skin rashes Lymphatics: Denies new lymphadenopathy or easy bruising Neurological:Denies numbness, tingling or new weaknesses Behavioral/Psych: Mood is stable, no new changes  Extremities: No lower extremity edema Breast:  denies any pain or lumps or nodules in either breasts All other systems were reviewed with the patient and are negative.  I have reviewed the past medical history, past surgical history, social history and family history with the patient and they are unchanged from previous note.  ALLERGIES:  has No Known Allergies.  MEDICATIONS:  Current Outpatient Prescriptions  Medication Sig Dispense Refill  . acetaminophen (TYLENOL) 325 MG tablet Take 650 mg by mouth every 4 (four) hours as needed for mild pain.     . benztropine (COGENTIN) 0.5 MG tablet Take 0.5 mg by mouth 2 (two) times daily.    . carboxymethylcellulose (REFRESH CELLUVISC) 1 % ophthalmic solution Apply 1 drop to eye 2 (two) times daily.    . carvedilol (COREG) 3.125 MG tablet TAKE 1 TABLET BY MOUTH TWICE DAILY. 60 tablet 3  . Cyclophosphamide (CYTOXAN IJ) Inject as directed every 14 (fourteen) days.    . divalproex (DEPAKOTE ER) 500 MG 24 hr tablet Take 1,000 mg by mouth at bedtime.    Marland Kitchen DOXOrubicin HCl (ADRIAMYCIN IV) Inject into the vein every 14 (fourteen) days.    . furosemide (LASIX) 40 MG tablet Take 40 mg by mouth every morning.    Marland Kitchen lisinopril (PRINIVIL,ZESTRIL) 10 MG tablet Take 10 mg by mouth  every morning.     . metFORMIN (GLUCOPHAGE) 500 MG tablet Take by mouth 2 (two) times daily with a meal.    . OLANZapine (ZYPREXA) 10 MG tablet Take 10 mg by mouth at bedtime.    Marland Kitchen omeprazole (PRILOSEC) 20 MG capsule Take 40 mg by mouth every morning.    Marland Kitchen oxyCODONE-acetaminophen (ROXICET) 5-325 MG tablet Take 1-2 tablets by mouth every 4 (four) hours as needed for moderate pain or severe pain. (Patient not  taking: Reported on 09/05/2016) 30 tablet 0  . Polyethyl Glycol-Propyl Glycol (SYSTANE) 0.4-0.3 % SOLN Apply 1 drop to eye 4 (four) times daily.    . simvastatin (ZOCOR) 10 MG tablet Take 10 mg by mouth at bedtime.     No current facility-administered medications for this visit.    Facility-Administered Medications Ordered in Other Visits  Medication Dose Route Frequency Provider Last Rate Last Dose  . 0.9 %  sodium chloride infusion   Intravenous Once Nicholas Lose, MD      . sodium chloride flush (NS) 0.9 % injection 10 mL  10 mL Intracatheter PRN Nicholas Lose, MD   10 mL at 04/14/16 1127    PHYSICAL EXAMINATION: ECOG PERFORMANCE STATUS: 1 - Symptomatic but completely ambulatory  Vitals:   09/23/16 1009  BP: 112/83  Pulse: 91  Resp: 18  Temp: 97.8 F (36.6 C)   Filed Weights   09/23/16 1009  Weight: 162 lb 11.2 oz (73.8 kg)    GENERAL:alert, no distress and comfortable SKIN: skin color, texture, turgor are normal, no rashes or significant lesions EYES: normal, Conjunctiva are pink and non-injected, sclera clear OROPHARYNX:no exudate, no erythema and lips, buccal mucosa, and tongue normal  NECK: supple, thyroid normal size, non-tender, without nodularity LYMPH:  no palpable lymphadenopathy in the cervical, axillary or inguinal LUNGS: clear to auscultation and percussion with normal breathing effort HEART: regular rate & rhythm and no murmurs and no lower extremity edema ABDOMEN:abdomen soft, non-tender and normal bowel sounds MUSCULOSKELETAL:no cyanosis of digits and no clubbing  NEURO: alert & oriented x 3 with fluent speech, no focal motor/sensory deficits EXTREMITIES: No lower extremity edema  LABORATORY DATA:  I have reviewed the data as listed   Chemistry      Component Value Date/Time   NA 142 06/30/2016 0941   K 4.2 06/30/2016 0941   CL 102 01/14/2016 1420   CO2 27 06/30/2016 0941   BUN 13.6 06/30/2016 0941   CREATININE 0.9 06/30/2016 0941      Component  Value Date/Time   CALCIUM 9.1 06/30/2016 0941   ALKPHOS 75 06/30/2016 0941   AST 14 06/30/2016 0941   ALT 12 06/30/2016 0941   BILITOT <0.30 06/30/2016 0941       Lab Results  Component Value Date   WBC 3.0 (L) 09/23/2016   HGB 11.4 (L) 09/23/2016   HCT 34.2 (L) 09/23/2016   MCV 85.1 09/23/2016   PLT 176 09/23/2016   NEUTROABS 1.7 09/23/2016     ASSESSMENT & PLAN:  Breast cancer of upper-outer quadrant of right female breast ([place]) Rt Lumpectomy 01/20/16: 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 Rt Lumpectomy 2017: IDC 1.7 cm, grade 3, with DCIS, 0/2 LN, ER 0%, PR 0%, Her 2 Neg  Ratio 1.35, Ki 67: 40%, T1CN0 (stage 1A)  Treatment summary: 1. Adjuvant chemotherapy (Adriamycin and Cytoxan dose dense 4 followed by Abraxane weekly 12) started 02/11/2016 completed 06/30/2016 2. Followed by radiation started 07/25/2016 completed 09/05/2016  Treatment plan:  1. Surveillance with annual mammograms (which will be arranged for 3 months from now) and breast MRIs Which will be done 6 months from the mammogram  2. bilateral salpingo-oophorectomy (BRCA2 mutation)  I requested Dr.Tsue to remove the port i  Return to clinic in 6 months for surveillance exams and follow-up.   Orders Placed This Encounter  Procedures  . MM DIAG BREAST TOMO BILATERAL    Standing Status:   Future    Standing Expiration Date:   11/23/2017    Order Specific Question:   Reason for Exam (SYMPTOM  OR DIAGNOSIS REQUIRED)    Answer:   Annual mammograms for BRCA mutation breast cancer    Order Specific Question:   Is the patient pregnant?    Answer:   No    Order Specific Question:   Preferred imaging location?    Answer:   [facility]   The patient has a good understanding of the overall plan. she agrees with it. she will call with any problems that may develop before the next visit here.   Rulon Eisenmenger, MD 09/23/16

## 2016-09-23 NOTE — Telephone Encounter (Signed)
FYI " I am the patient's mother.  I had a death in the family, overwhelmed and forgot about [name]'s appointment today.  I can pick her up and bring her in if it's okay." [phone] voicemail left on ext [phone] with this information  Advised she come in.  Estimated arrival at [place] am.

## 2016-09-23 NOTE — Assessment & Plan Note (Signed)
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 Adjuvant chemotherapy (Adriamycin and Cytoxan dose dense 4 followed by Abraxane weekly 12) started 02/11/2016 completed 06/30/2016 2. Followed by radiation started 07/25/2016 completed 09/05/2016  Treatment plan:  1. Surveillance with annual mammograms and breast MRIs 2. bilateral salpingo-oophorectomy (BRCA2 mutation)  Return to clinic in 6 months for surveillance exams and follow-up.

## 2016-09-26 ENCOUNTER — Telehealth: Payer: Self-pay

## 2016-09-26 NOTE — Telephone Encounter (Signed)
Per Dr. Geralyn Flash request, attempted to contact pt to find out name of group home so we could obtain the name of her GYN MD as pt could not recall in office on 10/13.  Attempted x 2 on 10/13 with inability to reach anyone or leave VM requesting call back.  Attempted again this morning and was able to leave VM requesting a call back to our office.  Will continue to attempt to obtain this information.

## 2016-09-28 ENCOUNTER — Other Ambulatory Visit: Payer: Self-pay | Admitting: Surgery

## 2016-10-06 ENCOUNTER — Encounter (HOSPITAL_COMMUNITY): Payer: Self-pay | Admitting: *Deleted

## 2016-10-06 ENCOUNTER — Encounter: Payer: Self-pay | Admitting: Radiation Oncology

## 2016-10-06 NOTE — Progress Notes (Signed)
Patient is in a group home - [facility] in [place].  Spoke with caregiver- Scarlette Shorts.  She will fax current [facility] and medical history.  Patient has had no labs, ekg or etc since 09/23/16 per [place].  I will fax back to her preop instructions for 10/13/2016.  I spoke with patient and she is alert and oriented x 3.  She is aware she is having port removed on 10/13/16 and mother will be bringing her and [facility] personnel will be taking care of her upon discharge. Med techs at [facility] administer medications.  PCP for patient- [facility] [address].

## 2016-10-11 NOTE — Progress Notes (Signed)
Preop instructions faxed to [facility].

## 2016-10-11 NOTE — Progress Notes (Signed)
Left message on phone of Alison [name] ( mother of paitent ) regarding date, time and arrival time of usrgery for daughter on 10/13/2016.  Asked for call back to confirm message had been received.

## 2016-10-12 NOTE — H&P (Signed)
Alison [name] is an 55 y.o. female.   Chief Complaint: Port-A-Cath in place HPI: this is a pleasant female who is finished her treatment for breast cancer.  She has done well and is now ready for Port-A-Cath removal.  She has no complaints.  Again, she has a significant history of mental issues.  Past Medical History:  Diagnosis Date  . Anemia   . Cancer ([place])   . Depression    chronic   . Diabetes mellitus without complication ([place])    type II  . Gallstones   . GERD (gastroesophageal reflux disease)   . History of radiation therapy 07/25/16- 09/05/16   Right Breast  . Hyperlipidemia   . Hypertension   . Schizophrenia [facility])     Past Surgical History:  Procedure Laterality Date  . ABDOMINAL HYSTERECTOMY    . BREAST LUMPECTOMY WITH RADIOACTIVE SEED AND SENTINEL LYMPH NODE BIOPSY Right 01/20/2016   Procedure: RIGHT BREAST LUMPECTOMY WITH RADIOACTIVE SEED AND SENTINEL LYMPH NODE BIOPSY;  Surgeon: Coralie Keens, MD;  Location: [place];  Service: General;  Laterality: Right;  . CHOLECYSTECTOMY N/A 04/16/2014   Procedure: LAPAROSCOPIC CHOLECYSTECTOMY ;  Surgeon: [name]. Georgette Dover, MD;  Location: [facility];  Service: General;  Laterality: N/A;  . PORTACATH PLACEMENT Left 01/20/2016   Procedure: INSERTION PORT-A-CATH;  Surgeon: Coralie Keens, MD;  Location: [place];  Service: General;  Laterality: Left;    Family History  Problem Relation Age of Onset  . Breast cancer Mother 40    s/p partial mastectomy  . Skin cancer Mother     on right knee; unspecified type  . Colon polyps Mother     three total  . Other Mother 34    history of a TAH-BSO for bleeding  . Prostate cancer Maternal Uncle     (x2 maternal uncles)  . Cirrhosis Maternal Grandfather     +EtOH  . Cancer Brother 55    vocal cord ca; not a smoker  . Autism Brother   . Cancer Maternal Uncle     vocal cord ca; +smoker  . Ovarian cancer Maternal Aunt     dx. >50  . Cancer Maternal Aunt      unspecified type  . Diabetes Maternal Aunt   . Colon cancer Cousin     (x2 maternal first cousins--sisters)  . Breast cancer Cousin     dx. 93 or younger  . Cancer Other     NOS cancer; lim info; all three of his daughters had cancer as well  . Breast cancer Other    Social History:  reports that she has been smoking Cigarettes.  She has been smoking about 0.25 packs per day. She has never used smokeless tobacco. She reports that she does not drink alcohol or use drugs.  Allergies: No Known Allergies  No prescriptions prior to admission.    No results found for this or any previous visit (from the past 48 hour(s)). No results found.  Review of Systems  All other systems reviewed and are negative.   There were no vitals taken for this visit. Physical Exam  Constitutional: She appears well-developed and well-nourished. No distress.  HENT:  Head: Normocephalic and atraumatic.  Eyes: Pupils are equal, round, and reactive to light.  Neck: Normal range of motion.  Cardiovascular: Normal rate, regular rhythm and normal heart sounds.   Respiratory: Effort normal and breath sounds normal. No respiratory distress.  GI: Soft. There is no tenderness.  Musculoskeletal:  Normal range of motion. She exhibits no edema.  Skin: Skin is warm and dry. No erythema.     Assessment/Plan Port-A-Cath in place  As her treatment has ended, we will proceed with Port-A-Cath removal.  The risks of this were discussed and she agrees to proceed.  Harl Bowie, MD 10/12/2016, 9:26 AM

## 2016-10-12 NOTE — Progress Notes (Signed)
Spoke with pts mother Genoveva Ill in regards to pts surgery date and time of arrival and location. Pts mother verbalized understanding.

## 2016-10-12 NOTE — Progress Notes (Signed)
Spoke with Marlowe Kays pts caregiver; verified surgical instructions were received with no further questions.

## 2016-10-13 ENCOUNTER — Ambulatory Visit (HOSPITAL_COMMUNITY): Payer: Medicaid Other | Admitting: Anesthesiology

## 2016-10-13 ENCOUNTER — Encounter (HOSPITAL_COMMUNITY): Payer: Self-pay | Admitting: *Deleted

## 2016-10-13 ENCOUNTER — Ambulatory Visit (HOSPITAL_COMMUNITY)
Admission: RE | Admit: 2016-10-13 | Discharge: 2016-10-13 | Disposition: A | Discharge status: Home / Self Care | Payer: Medicaid Other | Source: Ambulatory Visit | Attending: Surgery | Admitting: Surgery

## 2016-10-13 ENCOUNTER — Encounter (HOSPITAL_COMMUNITY)
Admission: RE | Disposition: A | Discharge status: Home / Self Care | Payer: Self-pay | Source: Ambulatory Visit | Attending: Surgery

## 2016-10-13 DIAGNOSIS — Z853 Personal history of malignant neoplasm of breast: Secondary | ICD-10-CM | POA: Diagnosis not present

## 2016-10-13 DIAGNOSIS — K219 Gastro-esophageal reflux disease without esophagitis: Secondary | ICD-10-CM | POA: Diagnosis not present

## 2016-10-13 DIAGNOSIS — Z8042 Family history of malignant neoplasm of prostate: Secondary | ICD-10-CM | POA: Insufficient documentation

## 2016-10-13 DIAGNOSIS — E785 Hyperlipidemia, unspecified: Secondary | ICD-10-CM | POA: Diagnosis not present

## 2016-10-13 DIAGNOSIS — E119 Type 2 diabetes mellitus without complications: Secondary | ICD-10-CM | POA: Insufficient documentation

## 2016-10-13 DIAGNOSIS — Z818 Family history of other mental and behavioral disorders: Secondary | ICD-10-CM | POA: Diagnosis not present

## 2016-10-13 DIAGNOSIS — I10 Essential (primary) hypertension: Secondary | ICD-10-CM | POA: Insufficient documentation

## 2016-10-13 DIAGNOSIS — Z9889 Other specified postprocedural states: Secondary | ICD-10-CM | POA: Insufficient documentation

## 2016-10-13 DIAGNOSIS — Z800 Family history of malignant neoplasm of digestive organs: Secondary | ICD-10-CM | POA: Diagnosis not present

## 2016-10-13 DIAGNOSIS — F17210 Nicotine dependence, cigarettes, uncomplicated: Secondary | ICD-10-CM | POA: Insufficient documentation

## 2016-10-13 DIAGNOSIS — Z90710 Acquired absence of both cervix and uterus: Secondary | ICD-10-CM | POA: Diagnosis not present

## 2016-10-13 DIAGNOSIS — F329 Major depressive disorder, single episode, unspecified: Secondary | ICD-10-CM | POA: Diagnosis not present

## 2016-10-13 DIAGNOSIS — Z923 Personal history of irradiation: Secondary | ICD-10-CM | POA: Insufficient documentation

## 2016-10-13 DIAGNOSIS — Z9049 Acquired absence of other specified parts of digestive tract: Secondary | ICD-10-CM | POA: Diagnosis not present

## 2016-10-13 DIAGNOSIS — Z8371 Family history of colonic polyps: Secondary | ICD-10-CM | POA: Diagnosis not present

## 2016-10-13 DIAGNOSIS — Z808 Family history of malignant neoplasm of other organs or systems: Secondary | ICD-10-CM | POA: Insufficient documentation

## 2016-10-13 DIAGNOSIS — Z833 Family history of diabetes mellitus: Secondary | ICD-10-CM | POA: Diagnosis not present

## 2016-10-13 DIAGNOSIS — Z79899 Other long term (current) drug therapy: Secondary | ICD-10-CM | POA: Diagnosis not present

## 2016-10-13 DIAGNOSIS — Z803 Family history of malignant neoplasm of breast: Secondary | ICD-10-CM | POA: Insufficient documentation

## 2016-10-13 DIAGNOSIS — Z452 Encounter for adjustment and management of vascular access device: Secondary | ICD-10-CM | POA: Diagnosis not present

## 2016-10-13 DIAGNOSIS — Z8041 Family history of malignant neoplasm of ovary: Secondary | ICD-10-CM | POA: Insufficient documentation

## 2016-10-13 DIAGNOSIS — Z7984 Long term (current) use of oral hypoglycemic drugs: Secondary | ICD-10-CM | POA: Diagnosis not present

## 2016-10-13 DIAGNOSIS — F209 Schizophrenia, unspecified: Secondary | ICD-10-CM | POA: Diagnosis not present

## 2016-10-13 HISTORY — DX: Depression, unspecified: F32.A

## 2016-10-13 HISTORY — PX: PORT-A-CATH REMOVAL: SHX5289

## 2016-10-13 HISTORY — DX: Major depressive disorder, single episode, unspecified: F32.9

## 2016-10-13 LAB — GLUCOSE, CAPILLARY
Glucose-Capillary: 105 mg/dL — ABNORMAL HIGH (ref 65–99)
Glucose-Capillary: 91 mg/dL (ref 65–99)

## 2016-10-13 SURGERY — REMOVAL PORT-A-CATH
Anesthesia: Monitor Anesthesia Care | Site: Chest | Laterality: Left

## 2016-10-13 MED ORDER — ONDANSETRON HCL 4 MG/2ML IJ SOLN
INTRAMUSCULAR | Status: DC | PRN
  Administered 2016-10-13: 4 mg via INTRAVENOUS

## 2016-10-13 MED ORDER — ONDANSETRON HCL 4 MG/2ML IJ SOLN
INTRAMUSCULAR | Status: AC
  Filled 2016-10-13: qty 2

## 2016-10-13 MED ORDER — TRAMADOL HCL 50 MG PO TABS: 50.0000 mg | ORAL_TABLET | Freq: Four times a day (QID) | ORAL | 0 refills | Status: DC | PRN

## 2016-10-13 MED ORDER — CEFAZOLIN SODIUM-DEXTROSE 2-4 GM/100ML-% IV SOLN
2.0000 g | INTRAVENOUS | Status: AC
  Administered 2016-10-13: 2 g via INTRAVENOUS

## 2016-10-13 MED ORDER — 0.9 % SODIUM CHLORIDE (POUR BTL) OPTIME
TOPICAL | Status: DC | PRN
  Administered 2016-10-13: 1000 mL

## 2016-10-13 MED ORDER — ACETAMINOPHEN 650 MG RE SUPP
650.0000 mg | RECTAL | Status: DC | PRN
  Filled 2016-10-13: qty 1

## 2016-10-13 MED ORDER — MEPERIDINE HCL 50 MG/ML IJ SOLN: 6.2500 mg | INTRAMUSCULAR | Status: DC | PRN

## 2016-10-13 MED ORDER — DEXAMETHASONE SODIUM PHOSPHATE 10 MG/ML IJ SOLN
INTRAMUSCULAR | Status: DC | PRN
  Administered 2016-10-13: 10 mg via INTRAVENOUS

## 2016-10-13 MED ORDER — MIDAZOLAM HCL 5 MG/5ML IJ SOLN
INTRAMUSCULAR | Status: DC | PRN
  Administered 2016-10-13: 2 mg via INTRAVENOUS

## 2016-10-13 MED ORDER — FENTANYL CITRATE (PF) 100 MCG/2ML IJ SOLN: 25.0000 ug | INTRAMUSCULAR | Status: DC | PRN

## 2016-10-13 MED ORDER — LIDOCAINE HCL 1 % IJ SOLN
INTRAMUSCULAR | Status: DC | PRN
  Administered 2016-10-13: 15 mL

## 2016-10-13 MED ORDER — PROPOFOL 10 MG/ML IV BOLUS
INTRAVENOUS | Status: AC
  Filled 2016-10-13: qty 20

## 2016-10-13 MED ORDER — CEFAZOLIN SODIUM-DEXTROSE 2-4 GM/100ML-% IV SOLN
INTRAVENOUS | Status: AC
  Filled 2016-10-13: qty 100

## 2016-10-13 MED ORDER — SODIUM CHLORIDE 0.9% FLUSH: 3.0000 mL | INTRAVENOUS | Status: DC | PRN

## 2016-10-13 MED ORDER — CHLORHEXIDINE GLUCONATE CLOTH 2 % EX PADS: 6.0000 | MEDICATED_PAD | Freq: Once | CUTANEOUS | Status: DC

## 2016-10-13 MED ORDER — LIDOCAINE 2% (20 MG/ML) 5 ML SYRINGE
INTRAMUSCULAR | Status: AC
  Filled 2016-10-13: qty 10

## 2016-10-13 MED ORDER — ACETAMINOPHEN 325 MG PO TABS: 650.0000 mg | ORAL_TABLET | ORAL | Status: DC | PRN

## 2016-10-13 MED ORDER — LACTATED RINGERS IV SOLN: INTRAVENOUS | Status: DC

## 2016-10-13 MED ORDER — LIDOCAINE 2% (20 MG/ML) 5 ML SYRINGE
INTRAMUSCULAR | Status: DC | PRN
  Administered 2016-10-13: 100 mg via INTRAVENOUS

## 2016-10-13 MED ORDER — LACTATED RINGERS IV SOLN
INTRAVENOUS | Status: DC
  Administered 2016-10-13: 1000 mL via INTRAVENOUS

## 2016-10-13 MED ORDER — OXYCODONE HCL 5 MG PO TABS: 5.0000 mg | ORAL_TABLET | ORAL | Status: DC | PRN

## 2016-10-13 MED ORDER — FENTANYL CITRATE (PF) 100 MCG/2ML IJ SOLN
INTRAMUSCULAR | Status: DC | PRN
  Administered 2016-10-13 (×2): 50 ug via INTRAVENOUS

## 2016-10-13 MED ORDER — SODIUM CHLORIDE 0.9 % IV SOLN: 250.0000 mL | INTRAVENOUS | Status: DC | PRN

## 2016-10-13 MED ORDER — METOCLOPRAMIDE HCL 5 MG/ML IJ SOLN: 10.0000 mg | Freq: Once | INTRAMUSCULAR | Status: DC | PRN

## 2016-10-13 MED ORDER — MORPHINE SULFATE (PF) 10 MG/ML IV SOLN: 1.0000 mg | INTRAVENOUS | Status: DC | PRN

## 2016-10-13 MED ORDER — LIDOCAINE HCL 1 % IJ SOLN
INTRAMUSCULAR | Status: AC
  Filled 2016-10-13: qty 20

## 2016-10-13 MED ORDER — BUPIVACAINE HCL (PF) 0.5 % IJ SOLN
INTRAMUSCULAR | Status: AC
  Filled 2016-10-13: qty 30

## 2016-10-13 MED ORDER — SODIUM CHLORIDE 0.9% FLUSH: 3.0000 mL | Freq: Two times a day (BID) | INTRAVENOUS | Status: DC

## 2016-10-13 MED ORDER — MIDAZOLAM HCL 2 MG/2ML IJ SOLN
INTRAMUSCULAR | Status: AC
  Filled 2016-10-13: qty 2

## 2016-10-13 MED ORDER — FENTANYL CITRATE (PF) 100 MCG/2ML IJ SOLN
INTRAMUSCULAR | Status: AC
  Filled 2016-10-13: qty 2

## 2016-10-13 MED ORDER — PROPOFOL 10 MG/ML IV BOLUS
INTRAVENOUS | Status: DC | PRN
  Administered 2016-10-13 (×3): 20 mg via INTRAVENOUS

## 2016-10-13 MED ORDER — DEXAMETHASONE SODIUM PHOSPHATE 10 MG/ML IJ SOLN
INTRAMUSCULAR | Status: AC
  Filled 2016-10-13: qty 1

## 2016-10-13 SURGICAL SUPPLY — 29 items
BANDAGE ADH SHEER 1  50/CT (GAUZE/BANDAGES/DRESSINGS) IMPLANT
BENZOIN TINCTURE PRP APPL 2/3 (GAUZE/BANDAGES/DRESSINGS) IMPLANT
BLADE SURG 15 STRL LF DISP TIS (BLADE) ×1 IMPLANT
BLADE SURG 15 STRL SS (BLADE) ×1
CHLORAPREP W/TINT 26ML (MISCELLANEOUS) ×2 IMPLANT
COVER SURGICAL LIGHT HANDLE (MISCELLANEOUS) ×2 IMPLANT
DECANTER SPIKE VIAL GLASS SM (MISCELLANEOUS) IMPLANT
DERMABOND ADVANCED (GAUZE/BANDAGES/DRESSINGS) ×1
DERMABOND ADVANCED .7 DNX12 (GAUZE/BANDAGES/DRESSINGS) ×1 IMPLANT
DRAPE LAPAROTOMY TRNSV 102X78 (DRAPE) ×2 IMPLANT
ELECT CAUTERY BLADE 6.4 (BLADE) IMPLANT
ELECT PENCIL ROCKER SW 15FT (MISCELLANEOUS) ×2 IMPLANT
ELECT REM PT RETURN 9FT ADLT (ELECTROSURGICAL) ×2
ELECTRODE REM PT RTRN 9FT ADLT (ELECTROSURGICAL) ×1 IMPLANT
GLOVE SURG SIGNA 7.5 PF LTX (GLOVE) ×2 IMPLANT
GOWN STRL REUS W/TWL XL LVL3 (GOWN DISPOSABLE) ×4 IMPLANT
KIT BASIN OR (CUSTOM PROCEDURE TRAY) ×2 IMPLANT
NEEDLE HYPO 22GX1.5 SAFETY (NEEDLE) ×2 IMPLANT
PACK BASIC VI WITH GOWN DISP (CUSTOM PROCEDURE TRAY) ×2 IMPLANT
SOL PREP POV-IOD 4OZ 10% (MISCELLANEOUS) IMPLANT
SPONGE LAP 4X18 X RAY DECT (DISPOSABLE) ×2 IMPLANT
STRIP CLOSURE SKIN 1/2X4 (GAUZE/BANDAGES/DRESSINGS) IMPLANT
SUT MNCRL AB 4-0 PS2 18 (SUTURE) ×2 IMPLANT
SUT VIC AB 3-0 SH 27 (SUTURE) ×1
SUT VIC AB 3-0 SH 27XBRD (SUTURE) ×1 IMPLANT
SYR 20CC LL (SYRINGE) ×2 IMPLANT
TOWEL OR 17X26 10 PK STRL BLUE (TOWEL DISPOSABLE) ×2 IMPLANT
TOWEL OR NON WOVEN STRL DISP B (DISPOSABLE) ×2 IMPLANT
YANKAUER SUCT BULB TIP 10FT TU (MISCELLANEOUS) IMPLANT

## 2016-10-13 NOTE — Transfer of Care (Signed)
Immediate Anesthesia Transfer of Care Note  Patient: Alison [name]  Procedure(s) Performed: Procedure(s): REMOVAL PORT-A-CATH (Left)  Patient Location: PACU  Anesthesia Type:MAC  Level of Consciousness:  sedated, patient cooperative and responds to stimulation  Airway & Oxygen Therapy:Patient Spontanous Breathing and Patient connected to face mask oxgen  Post-op Assessment:  Report given to PACU RN and Post -op Vital signs reviewed and stable  Post vital signs:  Reviewed and stable  Last Vitals:  Vitals:   10/13/16 0732  BP: 105/77  Pulse: 91  Resp: 16  Temp: 54.8 C    Complications: No apparent anesthesia complications

## 2016-10-13 NOTE — Interval H&P Note (Signed)
History and Physical Interval Note:no change in H and P  10/13/2016 9:07 AM  [name]  has presented today for surgery, with the diagnosis of port no longer needed  The various methods of treatment have been discussed with the patient and family. After consideration of risks, benefits and other options for treatment, the patient has consented to  Procedure(s): REMOVAL PORT-A-CATH (N/A) as a surgical intervention .  The patient's history has been reviewed, patient examined, no change in status, stable for surgery.  I have reviewed the patient's chart and labs.  Questions were answered to the patient's satisfaction.     Alison [name] A

## 2016-10-13 NOTE — Op Note (Signed)
REMOVAL PORT-A-CATH  Procedure Note  Alison [name] 10/13/2016   Pre-op Diagnosis: port no longer needed     Post-op Diagnosis: same  Procedure(s): REMOVAL PORT-A-CATH  Surgeon(s): Coralie Keens, MD  Anesthesia: Monitor Anesthesia Care  Staff:  Circulator: Barbee Shropshire, RN Scrub Person: Rockwell Germany, CST Float Surgical Tech: Kriste Basque  Estimated Blood Loss: Minimal               Indications: She is now finished chemotherapy for breast cancer and now is proceeding to the OR for Port-A-Cath removal  Procedure: The patient was brought to the operating room and identified as the correct patient. She is placed upon the operating table and anesthesia was induced. Her left chest was prepped and draped in the usual sterile fashion. I anesthetized the previous scar with lidocaine. Admitting incision with the scalpel into this down to the port. The suture was excised I completely removed the port and catheter completely intact. I then closed the catheter tract with a aggravate 3-0 Vicryl suture. I then closed the subcutaneous tissue with interrupted 3-0 Vicryl sutures and closed the skin with a running 4-0 Monocryl. Skin glue was then applied. The patient tolerated procedure well. All the counts were correct at the end of the procedure. The patient wasn't taken in a stable condition to the recovery room.          [name]   Date: 10/13/2016  Time: 9:57 AM

## 2016-10-13 NOTE — Anesthesia Preprocedure Evaluation (Signed)
Anesthesia Evaluation  Patient identified by MRN, date of birth, ID band Patient awake    Reviewed: Allergy & Precautions, H&P , NPO status , Patient's Chart, lab work & pertinent test results  Airway Mallampati: II  TM Distance: >3 FB Neck ROM: Full    Dental no notable dental hx. (+) Poor Dentition   Pulmonary Current Smoker,    Pulmonary exam normal breath sounds clear to auscultation       Cardiovascular hypertension, Pt. on medications negative cardio ROS Normal cardiovascular exam Rhythm:Regular Rate:Normal     Neuro/Psych Schizophrenia Patient lives in a group homenegative neurological ROS     GI/Hepatic negative GI ROS, Neg liver ROS,   Endo/Other  negative endocrine ROSdiabetes, Type 2, Oral Hypoglycemic Agents  Renal/GU negative Renal ROS  negative genitourinary   Musculoskeletal negative musculoskeletal ROS (+)   Abdominal   Peds negative pediatric ROS (+)  Hematology negative hematology ROS (+)   Anesthesia Other Findings   Reproductive/Obstetrics negative OB ROS                             Anesthesia Physical  Anesthesia Plan  ASA: II  Anesthesia Plan: General and MAC   Post-op Pain Management:    Induction: Intravenous  Airway Management Planned: Simple Face Mask and LMA  Additional Equipment:   Intra-op Plan:   Post-operative Plan: Extubation in OR  Informed Consent: I have reviewed the patients History and Physical, chart, labs and discussed the procedure including the risks, benefits and alternatives for the proposed anesthesia with the patient or authorized representative who has indicated his/her understanding and acceptance.   Dental advisory given  Plan Discussed with: CRNA  Anesthesia Plan Comments:         Anesthesia Quick Evaluation

## 2016-10-13 NOTE — Anesthesia Postprocedure Evaluation (Signed)
Anesthesia Post Note  Patient: Alison [name]  Procedure(s) Performed: Procedure(s) (LRB): REMOVAL PORT-A-CATH (Left)  Patient location during evaluation: PACU Anesthesia Type: MAC Level of consciousness: awake and alert Pain management: pain level controlled Vital Signs Assessment: post-procedure vital signs reviewed and stable Respiratory status: spontaneous breathing, nonlabored ventilation, respiratory function stable and patient connected to nasal cannula oxygen Cardiovascular status: stable and blood pressure returned to baseline Anesthetic complications: no    Last Vitals:  Vitals:   10/13/16 1119 10/13/16 1151  BP: 121/66 118/83  Pulse:  79  Resp: 14 16  Temp: 36.5 C 36.6 C    Last Pain:  Vitals:   10/13/16 1151  TempSrc: Oral  PainSc: 1                  Montez Hageman

## 2016-10-13 NOTE — Discharge Instructions (Signed)
Derma bond is on your incision. Do not scratch. You may shower in 24 hours. Call Dr. Ninfa Linden at [phone] for any bleeding, pain not relieved by medication, any signs of infection at this site. No change in diet or medications. You can take the pain medication that you have a prescription for or you can take tylenol for minor pain.    PATIENT INSTRUCTIONS POST-ANESTHESIA  IMMEDIATELY FOLLOWING SURGERY:  Do not drive or operate machinery for the first twenty four hours after surgery.  Do not make any important decisions for twenty four hours after surgery or while taking narcotic pain medications or sedatives.  If you develop intractable nausea and vomiting or a severe headache please notify your doctor immediately.  FOLLOW-UP:  Please make an appointment with your surgeon as instructed. You do not need to follow up with anesthesia unless specifically instructed to do so.  WOUND CARE INSTRUCTIONS (if applicable):  Keep a dry clean dressing on the anesthesia/puncture wound site if there is drainage.  Once the wound has quit draining you may leave it open to air.  Generally you should leave the bandage intact for twenty four hours unless there is drainage.  If the epidural site drains for more than 36-48 hours please call the anesthesia department.  QUESTIONS?:  Please feel free to call your physician or the hospital operator if you have any questions, and they will be happy to assist you.      Ok to shower tomorrow  No soaking in a tub for 1 week

## 2016-10-14 ENCOUNTER — Ambulatory Visit
Admission: RE | Admit: 2016-10-14 | Discharge: 2016-10-14 | Disposition: A | Discharge status: Home / Self Care | Payer: Medicaid Other | Source: Ambulatory Visit | Attending: Radiation Oncology | Admitting: Radiation Oncology

## 2016-10-14 ENCOUNTER — Encounter: Payer: Self-pay | Admitting: Radiation Oncology

## 2016-10-14 VITALS — BP 95/72 | HR 91 | Temp 98.6°F | Resp 12 | Wt 169.8 lb

## 2016-10-14 DIAGNOSIS — C50411 Malignant neoplasm of upper-outer quadrant of right female breast: Secondary | ICD-10-CM | POA: Insufficient documentation

## 2016-10-14 DIAGNOSIS — Z171 Estrogen receptor negative status [ER-]: Secondary | ICD-10-CM | POA: Diagnosis not present

## 2016-10-14 HISTORY — DX: Personal history of irradiation: Z92.3

## 2016-10-14 NOTE — Progress Notes (Signed)
Radiation Oncology         [phone] ________________________________  Name: Alison [name] MRN: [id number]  Date: 10/14/2016  DOB: August 31, 1961  Follow-Up Visit Note  Outpatient  CC: [name], MD  Nicholas Lose, MD  Diagnosis and Prior Radiotherapy:    ICD-9-CM ICD-10-CM   1. Malignant neoplasm of upper-outer quadrant of right breast in female, estrogen receptor negative ([place]) 174.4 C50.411    V86.1 Z17.1      Narrative:  The patient returns today for routine follow-up.  Doing well. Mild fatigue, no pain.  Applying vit E lotion to right breast. Lives in group home.                              ALLERGIES:  has No Known Allergies.  Meds: Current Outpatient Prescriptions  Medication Sig Dispense Refill  . benztropine (COGENTIN) 0.5 MG tablet Take 0.5 mg by mouth 2 (two) times daily.    . carboxymethylcellulose (REFRESH CELLUVISC) 1 % ophthalmic solution Place 1 drop into both eyes 4 (four) times daily.     . carvedilol (COREG) 3.125 MG tablet TAKE 1 TABLET BY MOUTH TWICE DAILY. 60 tablet 3  . divalproex (DEPAKOTE ER) 500 MG 24 hr tablet Take 1,000 mg by mouth at bedtime.    . furosemide (LASIX) 40 MG tablet Take 40 mg by mouth every morning.    Marland Kitchen Lifitegrast (XIIDRA) 5 % SOLN Place 1 drop into both eyes 2 (two) times daily.    Marland Kitchen lisinopril (PRINIVIL,ZESTRIL) 10 MG tablet Take 10 mg by mouth every morning.     Marland Kitchen LORazepam (ATIVAN) 0.5 MG tablet Take 0.5 mg by mouth every 6 (six) hours as needed (nausea or vomiting).    . metFORMIN (GLUCOPHAGE) 500 MG tablet Take by mouth 2 (two) times daily with a meal.    . OLANZapine (ZYPREXA) 10 MG tablet Take 10 mg by mouth at bedtime.    . Olopatadine HCl (PAZEO) 0.7 % SOLN Apply 1 drop to eye every evening. 1 drop in each eye every evening.    Marland Kitchen omeprazole (PRILOSEC) 20 MG capsule Take 40 mg by mouth every morning.    . simvastatin (ZOCOR) 10 MG tablet Take 10 mg by mouth at bedtime.    . traMADol (ULTRAM) 50 MG tablet Take 1 tablet  (50 mg total) by mouth every 6 (six) hours as needed (pain). 30 tablet 0   No current facility-administered medications for this encounter.    Facility-Administered Medications Ordered in Other Encounters  Medication Dose Route Frequency Provider Last Rate Last Dose  . 0.9 %  sodium chloride infusion   Intravenous Once Nicholas Lose, MD      . sodium chloride flush (NS) 0.9 % injection 10 mL  10 mL Intracatheter PRN Nicholas Lose, MD   10 mL at 04/14/16 1127    Physical Findings: The patient is in no acute distress. Patient is alert and oriented.  weight is 169 lb 12.8 oz (77 kg). Her oral temperature is 98.6 F (37 C). Her blood pressure is 95/72 and her pulse is 91. Her respiration is 12 and oxygen saturation is 97%. .    Right breast and side of right torso are hyperpigmented and dry, but skin has improved since radiotherapy.  Lab Findings: Lab Results  Component Value Date   WBC 3.0 (L) 09/23/2016   HGB 11.4 (L) 09/23/2016   HCT 34.2 (L) 09/23/2016   MCV 85.1 09/23/2016  PLT 176 09/23/2016    Radiographic Findings: No results found.  Impression/Plan:  Healing from radiotherapy for right breast cancer. 1) Followup: I encouraged her to continue with yearly mammography and followup with medical oncology. I will see her back on an as-needed basis. I have encouraged her to call if she has any issues or concerns in the future. I wished her the very best. I sent a note to Dr. Lindi Adie so he is aware patient is ready to be scheduled for followup with him.  Survivorship scheduled for Dec. 2) Skin care: At patient's request, I wrote a letter to her home's staff to encourage application of Vit E lotion twice daily to skin in RT fields for more healing.  _____________________________________   Eppie Gibson, MD

## 2016-10-14 NOTE — Progress Notes (Signed)
Photon Boost Complex Emergency planning/management officer Note Outpatient Diagnosis: Breast Cancer  1. Breast cancer of upper-outer quadrant of right female breast ([place]) 174.4 C50.411    The patient's CT images from her  simulation were reviewed to plan her boost treatment to her right breast  lumpectomy cavity.  The boost to the lumpectomy cavity will be delivered with 3 photon fields using MLCs for custom blocks again heart and lungs.  This constitutes 3 complex treatment devices. Isodose plan was reviewed and approved. 10 Gy in 5 fractions prescribed.  -----------------------------------  Eppie Gibson, MD

## 2016-10-14 NOTE — Progress Notes (Signed)
She is currently in no pain.  Pt complains of slight fatigue .  Pt right breast- positive for Hyperpigmentation.  Pt denies edema. Pt continues to apply lotion with Vitamin E as directed. BP 95/72   Pulse 91   Temp 98.6 F (37 C) (Oral)   Resp 12   Wt 169 lb 12.8 oz (77 kg)   SpO2 97%   BMI 31.56 kg/m  Wt Readings from Last 3 Encounters:  10/14/16 169 lb 12.8 oz (77 kg)  10/13/16 160 lb (72.6 kg)  09/23/16 162 lb 11.2 oz (73.8 kg)   Pt reports: Yes No Comments  Nolvadex (tamoxifen) '[]'$  '[x]'$    Arimidex (anastrozole) '[]'$  '[x]'$    Last Med Onc Appt: Date: Next: Doctor:   Last Mammogram: Date: Next: 10/27/16   Survivorship Appt: Date:11/25/16 '[x]'$ [facility] pamphlet '[x]'$  [employer] pamphlet

## 2016-10-17 NOTE — Addendum Note (Signed)
Encounter addended by: Ernst Spell, RN on: 10/17/2016  8:15 AM<BR>    Actions taken: Charge Capture section accepted

## 2016-10-27 ENCOUNTER — Ambulatory Visit
Admission: RE | Admit: 2016-10-27 | Discharge: 2016-10-27 | Disposition: A | Discharge status: Home / Self Care | Payer: Medicaid Other | Source: Ambulatory Visit | Attending: Hematology and Oncology | Admitting: Hematology and Oncology

## 2016-10-27 DIAGNOSIS — Z171 Estrogen receptor negative status [ER-]: Secondary | ICD-10-CM

## 2016-10-27 DIAGNOSIS — C50411 Malignant neoplasm of upper-outer quadrant of right female breast: Secondary | ICD-10-CM

## 2016-11-14 NOTE — Assessment & Plan Note (Signed)
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 Additionally, one variant of uncertain significance, called "c.16A>C (p.Thr6Pro)" was found in one copy of the MSH6gene. 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 Adjuvant chemotherapy (Adriamycin and Cytoxan dose dense 4 followed by Abraxane weekly 12) started 02/11/2016 completed 06/30/2016 2. Followed by radiation started 07/25/2016 completed 09/05/2016  Treatment plan:  1. Surveillance with annual mammograms (which will be arranged for 3 months from now) and breast MRIs Which will be done 6 months from the mammogram  2. bilateral salpingo-oophorectomy (BRCA2 mutation)  Return to clinic in 1 year for surveillance exams and follow-up.

## 2016-11-15 ENCOUNTER — Encounter: Payer: Self-pay | Admitting: Hematology and Oncology

## 2016-11-15 ENCOUNTER — Ambulatory Visit (HOSPITAL_BASED_OUTPATIENT_CLINIC_OR_DEPARTMENT_OTHER): Payer: Medicaid Other | Admitting: Hematology and Oncology

## 2016-11-15 DIAGNOSIS — Z171 Estrogen receptor negative status [ER-]: Secondary | ICD-10-CM

## 2016-11-15 DIAGNOSIS — C50411 Malignant neoplasm of upper-outer quadrant of right female breast: Secondary | ICD-10-CM

## 2016-11-15 NOTE — Progress Notes (Signed)
Patient Care Team: Iona Beard, MD as PCP - General (Family Medicine)  DIAGNOSIS:  Encounter Diagnosis  Name Primary?  . Malignant neoplasm of upper-outer quadrant of right breast in female, estrogen receptor negative ([place])     SUMMARY OF ONCOLOGIC HISTORY:   Breast cancer of upper-outer quadrant of right female breast ([place])   11/25/2015 Mammogram    Right breast mass 12 cm from the nipple: 1.4 x 1.3 x 0.9 cm, two right axillary lymph nodes with borderline cortical thickening measuring up to 3 mm      12/04/2015 Initial Diagnosis    Right breast biopsy 10:00: Invasive ductal carcinoma with DCIS, grade 2, ER 0%, PR 0%, HER-2 negative, ratio1.31, Ki-67 40%      01/13/2016 Procedure    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      01/20/2016 Surgery    Rt Lumpectomy: IDC 1.7 cm, grade 3, with DCIS, 0/2 LN, ER 0%, PR 0%, Her 2 Neg Ratio 1.35, Ki 67: 40%, T1CN0 (stage 1A)      02/11/2016 - 06/30/2016 Chemotherapy    Adjuvant chemotherapy with dose dense Adriamycin and Cytoxan 4 followed by Abraxane weekly 12      07/25/2016 - 09/05/2016 Radiation Therapy    Adjuvant radiation therapy       CHIEF COMPLIANT: Surveillance of breast cancer  INTERVAL HISTORY: Alison [name] is a 55 year old with above-mentioned history of right breast cancer underwent lumpectomy followed by adjuvant chemotherapy and completed radiation September 2017. She is here for routine follow-up. She reports that she is doing much better since that treatment has been completed. Her energy levels are improving. Hair is growing. She is feeling back to herself again.  REVIEW OF SYSTEMS:   Constitutional: Denies fevers, chills or abnormal weight loss Eyes: Denies blurriness of vision Ears, nose, mouth, throat, and face: Denies mucositis or sore throat Respiratory: Denies  cough, dyspnea or wheezes Cardiovascular: Denies palpitation, chest discomfort Gastrointestinal:  Denies nausea, heartburn or change in bowel habits Skin: Denies abnormal skin rashes Lymphatics: Denies new lymphadenopathy or easy bruising Neurological:Denies numbness, tingling or new weaknesses Behavioral/Psych: Mood is stable, no new changes  Extremities: No lower extremity edema Breast:  denies any pain or lumps or nodules in either breasts All other systems were reviewed with the patient and are negative.  I have reviewed the past medical history, past surgical history, social history and family history with the patient and they are unchanged from previous note.  ALLERGIES:  has No Known Allergies.  MEDICATIONS:  Current Outpatient Prescriptions  Medication Sig Dispense Refill  . benztropine (COGENTIN) 0.5 MG tablet Take 0.5 mg by mouth 2 (two) times daily.    . carboxymethylcellulose (REFRESH CELLUVISC) 1 % ophthalmic solution Place 1 drop into both eyes 4 (four) times daily.     . carvedilol (COREG) 3.125 MG tablet TAKE 1 TABLET BY MOUTH TWICE DAILY. 60 tablet 3  . divalproex (DEPAKOTE ER) 500 MG 24 hr tablet Take 1,000 mg by mouth at bedtime.    . furosemide (LASIX) 40 MG tablet Take 40 mg by mouth every morning.    Marland Kitchen Lifitegrast (XIIDRA) 5 % SOLN Place 1 drop into both eyes 2 (two) times daily.    Marland Kitchen lisinopril (PRINIVIL,ZESTRIL) 10 MG tablet Take 10 mg by mouth every morning.     Marland Kitchen LORazepam (ATIVAN) 0.5 MG tablet Take 0.5 mg by mouth every 6 (six)  hours as needed (nausea or vomiting).    . metFORMIN (GLUCOPHAGE) 500 MG tablet Take by mouth 2 (two) times daily with a meal.    . OLANZapine (ZYPREXA) 10 MG tablet Take 10 mg by mouth at bedtime.    . Olopatadine HCl (PAZEO) 0.7 % SOLN Apply 1 drop to eye every evening. 1 drop in each eye every evening.    Marland Kitchen omeprazole (PRILOSEC) 20 MG capsule Take 40 mg by mouth every morning.    . simvastatin (ZOCOR) 10 MG tablet Take 10 mg by  mouth at bedtime.    . traMADol (ULTRAM) 50 MG tablet Take 1 tablet (50 mg total) by mouth every 6 (six) hours as needed (pain). 30 tablet 0   No current facility-administered medications for this visit.    Facility-Administered Medications Ordered in Other Visits  Medication Dose Route Frequency Provider Last Rate Last Dose  . 0.9 %  sodium chloride infusion   Intravenous Once Nicholas Lose, MD      . sodium chloride flush (NS) 0.9 % injection 10 mL  10 mL Intracatheter PRN Nicholas Lose, MD   10 mL at 04/14/16 1127    PHYSICAL EXAMINATION: ECOG PERFORMANCE STATUS: 1 - Symptomatic but completely ambulatory  There were no vitals filed for this visit. There were no vitals filed for this visit.  GENERAL:alert, no distress and comfortable SKIN: skin color, texture, turgor are normal, no rashes or significant lesions EYES: normal, Conjunctiva are pink and non-injected, sclera clear OROPHARYNX:no exudate, no erythema and lips, buccal mucosa, and tongue normal  NECK: supple, thyroid normal size, non-tender, without nodularity LYMPH:  no palpable lymphadenopathy in the cervical, axillary or inguinal LUNGS: clear to auscultation and percussion with normal breathing effort HEART: regular rate & rhythm and no murmurs and no lower extremity edema ABDOMEN:abdomen soft, non-tender and normal bowel sounds MUSCULOSKELETAL:no cyanosis of digits and no clubbing  NEURO: alert & oriented x 3 with fluent speech, no focal motor/sensory deficits EXTREMITIES: No lower extremity edema  LABORATORY DATA:  I have reviewed the data as listed   Chemistry      Component Value Date/Time   NA 143 09/23/2016 1000   K 4.2 09/23/2016 1000   CL 102 01/14/2016 1420   CO2 28 09/23/2016 1000   BUN 11.5 09/23/2016 1000   CREATININE 0.8 09/23/2016 1000      Component Value Date/Time   CALCIUM 9.3 09/23/2016 1000   ALKPHOS 88 09/23/2016 1000   AST 20 09/23/2016 1000   ALT 16 09/23/2016 1000   BILITOT 0.25  09/23/2016 1000       Lab Results  Component Value Date   WBC 3.0 (L) 09/23/2016   HGB 11.4 (L) 09/23/2016   HCT 34.2 (L) 09/23/2016   MCV 85.1 09/23/2016   PLT 176 09/23/2016   NEUTROABS 1.7 09/23/2016    ASSESSMENT & PLAN:  Breast cancer of upper-outer quadrant of right female breast ([place]) Rt Lumpectomy 01/20/16: IDC 1.7 cm, grade 3, with DCIS, 0/2 LN, ER 0%, PR 0%, Her 2 Neg Ratio 1.35, Ki 67: 40%, T1CN0 (stage 1A) (patient has mental handicap)  BRCA2 MutationPositive [id number] (p.[id number])". Additionally, one variant of uncertain significance, called "c.16A>C (p.Thr6Pro)" was found in one copy of the MSH6gene. 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 Adjuvant chemotherapy (Adriamycin and Cytoxan dose dense 4 followed by Abraxane weekly 12) started  02/11/2016 completed 06/30/2016 2. Followed by radiation started 07/25/2016 completed 09/05/2016  Treatment plan:  1. Surveillance with annual mammograms (which will be arranged for 3 months from now) and breast MRIs Which will be done 6 months from the mammogram  2. bilateral salpingo-oophorectomy (BRCA2 mutation)   Return to clinic in 6 months for surveillance exams and follow-up. Patient will have mammograms in May 2018 along with her mother   No orders of the defined types were placed in this encounter.  The patient has a good understanding of the overall plan. she agrees with it. she will call with any problems that may develop before the next visit here.   Rulon Eisenmenger, MD 11/15/16

## 2016-11-24 NOTE — Progress Notes (Signed)
CLINIC:  Survivorship   REASON FOR VISIT:  Routine follow-up post-treatment for a recent history of breast cancer.  BRIEF ONCOLOGIC HISTORY:    Breast cancer of upper-outer quadrant of right female breast ([place])   11/25/2015 Mammogram    Right breast mass 12 cm from the nipple: 1.4 x 1.3 x 0.9 cm, two right axillary lymph nodes with borderline cortical thickening measuring up to 3 mm      12/04/2015 Initial Diagnosis    Right breast biopsy 10:00: Invasive ductal carcinoma with DCIS, grade 2, ER 0%, PR 0%, HER-2 negative, ratio1.31, Ki-67 40%      01/13/2016 Procedure    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 Additionally, 1 VUS, called "c.16A>C (p.Thr6Pro)" was found in one copy of the MSH6 gene. Genes analyzed: ATM, BARD1, BRCA1, BRCA2, BRIP1, CDH1, CHEK2, FANCC, MLH1, MSH2, MSH6, NBN, PALB2, PMS2, PTEN, RAD51C, RAD51D, TP53, and XRCC2.  This panel also includes deletion/duplication analysis (without sequencing) for one gene, EPCAM      01/20/2016 Surgery    Rt Lumpectomy Ninfa Linden): IDC 1.7 cm, grade 3, with DCIS, 0/2 LN, ER 0%, PR 0%, Her 2 Neg Ratio 1.35, Ki 67: 40%, T1CN0 (stage 1A)      02/11/2016 - 06/30/2016 Chemotherapy    Adjuvant chemotherapy with dose dense Adriamycin and Cytoxan 4 followed by Abraxane weekly 12      07/25/2016 - 09/05/2016 Radiation Therapy    Adjuvant radiation therapy Isidore Moos). Right breast / 50 Gy in 25 fractions.  Right breast boost / 10 Gy in 5 fractions       INTERVAL HISTORY:  Ms. Poland presents to the [facility] today for our initial meeting to review her survivorship care plan detailing her treatment course for breast cancer, as well as monitoring long-term side effects of that treatment, education regarding health maintenance, screening, and overall wellness and health promotion.     Overall, Ms. Laird reports feeling quite well since completing her radiation therapy approximately 2.5 months ago.  Her  appetite and energy levels are good.  She tells me she is "eating like a horse."  Denies hot flashes or breast pain.  Denies vaginal bleeding.  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  She thinks "it is okay for now; I just move my hands and feet around and it gets better."  Otherwise, she is largely without complaints today.     REVIEW OF SYSTEMS:  Review of Systems  Constitutional: Negative.   HENT:  Negative.   Eyes: Negative.   Respiratory: Negative.   Cardiovascular: Negative.   Gastrointestinal: Negative.   Endocrine: Negative.  Negative for hot flashes.  Genitourinary: Negative.  Negative for vaginal bleeding.   Musculoskeletal: Negative.   Skin: Negative.   Neurological: Positive for numbness (peripheral neuropathy to hands/feet ).  Hematological: Negative.   Psychiatric/Behavioral: Negative.   Breast: Denies any new nodularity, masses, tenderness, nipple changes, or nipple discharge.    A 14-point review of systems was completed and was negative, except as noted above.   ONCOLOGY TREATMENT TEAM:  1. Surgeon:  Dr. Ninfa Linden at [facility] 2. Medical Oncologist: Dr. Lindi Adie 3. Radiation Oncologist: Dr. Isidore Moos    PAST MEDICAL/SURGICAL HISTORY:  Past Medical History:  Diagnosis Date  . Anemia   . Cancer ([place])   . Depression    chronic   . Diabetes mellitus without complication ([place])    type II  . Gallstones   .  GERD (gastroesophageal reflux disease)   . History of radiation therapy 07/25/16- 09/05/16   Right Breast  . Hyperlipidemia   . Hypertension   . Schizophrenia [facility])    Past Surgical History:  Procedure Laterality Date  . ABDOMINAL HYSTERECTOMY    . BREAST LUMPECTOMY WITH RADIOACTIVE SEED AND SENTINEL LYMPH NODE BIOPSY Right 01/20/2016   Procedure: RIGHT BREAST LUMPECTOMY WITH RADIOACTIVE SEED AND SENTINEL LYMPH NODE BIOPSY;  Surgeon: Coralie Keens, MD;   Location: [facility];  Service: General;  Laterality: Right;  . CHOLECYSTECTOMY N/A 04/16/2014   Procedure: LAPAROSCOPIC CHOLECYSTECTOMY ;  Surgeon: [name]. Georgette Dover, MD;  Location: [facility];  Service: General;  Laterality: N/A;  . PORT-A-CATH REMOVAL Left 10/13/2016   Procedure: REMOVAL PORT-A-CATH;  Surgeon: Coralie Keens, MD;  Location: [facility];  Service: General;  Laterality: Left;  . PORTACATH PLACEMENT Left 01/20/2016   Procedure: INSERTION PORT-A-CATH;  Surgeon: Coralie Keens, MD;  Location: [place];  Service: General;  Laterality: Left;     ALLERGIES:  No Known Allergies   CURRENT MEDICATIONS:  Outpatient Encounter Prescriptions as of 11/25/2016  Medication Sig  . benztropine (COGENTIN) 0.5 MG tablet Take 0.5 mg by mouth 2 (two) times daily.  . carboxymethylcellulose (REFRESH CELLUVISC) 1 % ophthalmic solution Place 1 drop into both eyes 4 (four) times daily.   . carvedilol (COREG) 3.125 MG tablet TAKE 1 TABLET BY MOUTH TWICE DAILY.  . divalproex (DEPAKOTE ER) 500 MG 24 hr tablet Take 1,000 mg by mouth at bedtime.  . furosemide (LASIX) 40 MG tablet Take 40 mg by mouth every morning.  Marland Kitchen Lifitegrast (XIIDRA) 5 % SOLN Place 1 drop into both eyes 2 (two) times daily.  Marland Kitchen lisinopril (PRINIVIL,ZESTRIL) 10 MG tablet Take 10 mg by mouth every morning.   Marland Kitchen LORazepam (ATIVAN) 0.5 MG tablet Take 0.5 mg by mouth every 6 (six) hours as needed (nausea or vomiting).  . metFORMIN (GLUCOPHAGE) 500 MG tablet Take by mouth 2 (two) times daily with a meal.  . OLANZapine (ZYPREXA) 10 MG tablet Take 10 mg by mouth at bedtime.  . Olopatadine HCl (PAZEO) 0.7 % SOLN Apply 1 drop to eye every evening. 1 drop in each eye every evening.  Marland Kitchen omeprazole (PRILOSEC) 20 MG capsule Take 40 mg by mouth every morning.  . simvastatin (ZOCOR) 10 MG tablet Take 10 mg by mouth at bedtime.  . traMADol (ULTRAM) 50 MG tablet Take 1 tablet (50 mg total) by mouth every 6 (six) hours as needed  (pain).   Facility-Administered Encounter Medications as of 11/25/2016  Medication  . 0.9 %  sodium chloride infusion  . sodium chloride flush (NS) 0.9 % injection 10 mL     ONCOLOGIC FAMILY HISTORY:  Family History  Problem Relation Age of Onset  . Breast cancer Mother 67    s/p partial mastectomy  . Skin cancer Mother     on right knee; unspecified type  . Colon polyps Mother     three total  . Other Mother 30    history of a TAH-BSO for bleeding  . Prostate cancer Maternal Uncle     (x2 maternal uncles)  . Cirrhosis Maternal Grandfather     +EtOH  . Cancer Brother 55    vocal cord ca; not a smoker  . Autism Brother   . Cancer Maternal Uncle     vocal cord ca; +smoker  . Ovarian cancer Maternal Aunt     dx. >50  . Cancer Maternal  Aunt     unspecified type  . Diabetes Maternal Aunt   . Colon cancer Cousin     (x2 maternal first cousins--sisters)  . Breast cancer Cousin     dx. 2 or younger  . Cancer Other     NOS cancer; lim info; all three of his daughters had cancer as well  . Breast cancer Other      GENETIC COUNSELING/TESTING: 01/13/16-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 Additionally, 1 VUS, called "c.16A>C (p.Thr6Pro)" was found in one copy of the MSH6 gene. Genes analyzed: ATM, BARD1, BRCA1, BRCA2, BRIP1, CDH1, CHEK2, FANCC, MLH1, MSH2, MSH6, NBN, PALB2, PMS2, PTEN, RAD51C, RAD51D, TP53, and XRCC2.  This panel also includes deletion/duplication analysis (without sequencing) for one gene, EPCAM   SOCIAL HISTORY:  Ameliyah Sarno lives in a group home in [place], [place].  She denies any current tobacco use; she is a former smoker and quit 2 years ago.  She denies any illicit drug use.  She drinks alcohol "only on special occasions."    PHYSICAL EXAMINATION:  Vital Signs: Vitals:   11/25/16 1019  BP: 112/78  Pulse: 97  Resp: 18  Temp: 97.6 F (36.4 C)   Filed Weights   11/25/16 1019  Weight: 165 lb 9.6 oz (75.1 kg)    General: Well-nourished, well-appearing female in no acute distress.  She is unaccompanied today.   HEENT: Head is normocephalic.  Pupils equal and reactive to light. Conjunctivae clear without exudate.  Sclerae anicteric. Oral mucosa is pink, moist.  Oropharynx is pink without lesions or erythema.  Lymph: No cervical, supraclavicular, or infraclavicular lymphadenopathy noted on palpation.  Cardiovascular: Regular rate and rhythm.Marland Kitchen Respiratory: Clear to auscultation bilaterally. Chest expansion symmetric; breathing non-labored.  GI: Abdomen soft and round; non-tender, non-distended. Bowel sounds normoactive.  GU: Deferred.  Neuro: No focal deficits. Steady gait.  Psych: Mood and affect normal and appropriate for situation.  Extremities: No edema. Skin: Warm and dry.  LABORATORY DATA:  None for this visit.  DIAGNOSTIC IMAGING:  Most recent mammogram: 10/27/16      ASSESSMENT AND PLAN:  Ms.. Deadwyler is a pleasant 55 y.o. female with Stage IA right breast invasive ductal carcinoma, ER-/PR-/HER2-, diagnosed in 12/04/15. She was found to be BRCA2 (+).  Treated with lumpectomy, adjuvant chemotherapy with Adriamycin/Cytoxan x 4 cycles, followed by Abraxane x 12 cycles completing chemo on 06/30/16.  She went on to have adjuvant radiation therapy, which she completed on 09/05/16. She presents to the Survivorship Clinic for our initial meeting and routine follow-up post-completion of treatment for breast cancer.    1. Stage IA right breast cancer, BRCA2+:  Ms. Northway is continuing to recover from definitive treatment for breast cancer. 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  She will follow-up with her medical oncologist, Dr. Lindi Adie, in 05/2017 with history and physical exam per surveillance protocol.  Today, a comprehensive survivorship  care plan and treatment summary was reviewed with the patient today detailing her breast cancer diagnosis, treatment course, potential late/long-term effects of treatment, appropriate follow-up care with recommendations for the future, and patient education resources.  A copy of this summary, along with a letter will be sent to the patient's primary care provider via mail/fax/In Basket message after today's visit.    2. BRCA2+ genetic mutation: Her mammogram was completed in 10/2016.  She will be due for annual surveillance breast MRI  in 04/2017; orders placed today. She will see Dr. Lindi Adie in 05/2017 for continued follow-up. She tells me she had hysterectomy in the past, but is not sure if she still has her ovaries/fallopian tubes. Reviewed that we generally recommend consideration of bilateral salpingo-oophorectomy in patients who are BRCA2 positive. I recommended that she try to locate the records from her gynecologist to see if she does indeed have her ovaries and fallopian tubes. She will follow-up further with Dr. Lindi Adie in June.  3. Bone health:  Given Ms. Karpowicz's age & history of breast cancer and her current treatment, she is at risk for bone demineralization.  We do not have any DEXA scan records available for review. Given that she will not require adjuvant anti-estrogen therapy, I will defer any future bone mineral density testing to her medical oncologist or PCP, as clinically indicated. In the meantime, she was encouraged to increase her consumption of foods rich in calcium, as well as increase her weight-bearing activities.  She was given education on specific activities to promote bone health.  4. Cancer screening:  Due to Ms. Mangano's history and her age, she should receive screening for skin cancers, colon cancer, and gynecologic cancers.  The information and recommendations are listed on the patient's comprehensive care plan/treatment summary and were reviewed in detail with the patient.     5. Health maintenance and wellness promotion: Ms. Karn was encouraged to consume 5-7 servings of fruits and vegetables per day. We reviewed the "Nutrition Rainbow" handout, as well as the handout "Take Control of Your Health and Reduce Your Cancer Risk" from the [facility].  She was also encouraged to engage in moderate to vigorous exercise for 30 minutes per day most days of the week. We discussed the [employer] fitness program, which is designed for cancer survivors to help them become more physically fit after cancer treatments.  She was instructed to limit her alcohol consumption and continue to abstain from tobacco use.    6. Support services/counseling: It is not uncommon for this period of the patient's cancer care trajectory to be one of many emotions and stressors.  We discussed an opportunity for her to participate in the next session of [facility] ("Finding Your New Normal") support group series designed for patients after they have completed treatment.   Ms. Konz was encouraged to take advantage of our many other support services programs, support groups, and/or counseling in coping with her new life as a cancer survivor after completing anti-cancer treatment.  She was offered support today through active listening and expressive supportive counseling.  She was given information regarding our available services and encouraged to contact me with any questions or for help enrolling in any of our support group/programs.    Dispo:   -Return to cancer center to see Dr. Lindi Adie in 05/2017.  -She is welcome to return back to the Survivorship Clinic at any time; no additional follow-up needed at this time.  -Consider referral back to survivorship as a long-term survivor for continued surveillance  A total of 30 minutes of face-to-face time was spent with this patient with greater than 50% of that time in counseling and care-coordination.   Mike Craze, NP Survivorship  Program [place] [phone]   Note: PRIMARY CARE PROVIDER [name], [place] [phone]

## 2016-11-25 ENCOUNTER — Ambulatory Visit (HOSPITAL_BASED_OUTPATIENT_CLINIC_OR_DEPARTMENT_OTHER): Payer: Medicaid Other | Admitting: Adult Health

## 2016-11-25 VITALS — BP 112/78 | HR 97 | Temp 97.6°F | Resp 18 | Ht 61.5 in | Wt 165.6 lb

## 2016-11-25 DIAGNOSIS — Z853 Personal history of malignant neoplasm of breast: Secondary | ICD-10-CM

## 2016-11-25 DIAGNOSIS — G620 Drug-induced polyneuropathy: Secondary | ICD-10-CM | POA: Diagnosis not present

## 2016-11-25 DIAGNOSIS — Z171 Estrogen receptor negative status [ER-]: Secondary | ICD-10-CM

## 2016-11-25 DIAGNOSIS — C50411 Malignant neoplasm of upper-outer quadrant of right female breast: Secondary | ICD-10-CM

## 2016-11-30 ENCOUNTER — Encounter: Payer: Self-pay | Admitting: Adult Health

## 2016-12-01 ENCOUNTER — Other Ambulatory Visit: Payer: Self-pay

## 2016-12-01 ENCOUNTER — Telehealth: Payer: Self-pay

## 2016-12-01 NOTE — Telephone Encounter (Signed)
Received a phone call from pt group home, [facility], who is caring for pt. Would like to request a copy of current discontinued medications for their records. Pt no longer takes compazine, zofran, ativan and tramadol. Will fax over discontinued medication for their records to keep. Fax [phone]

## 2016-12-08 ENCOUNTER — Encounter: Payer: Self-pay | Admitting: Emergency Medicine

## 2017-02-03 IMAGING — CR DG CHEST 1V PORT
1 series · 1 of 1 positions shown · non-contrast
Comparison: 04/09/2014

CLINICAL DATA: Port placement

EXAM:
PORTABLE CHEST 1 VIEW

[AP]
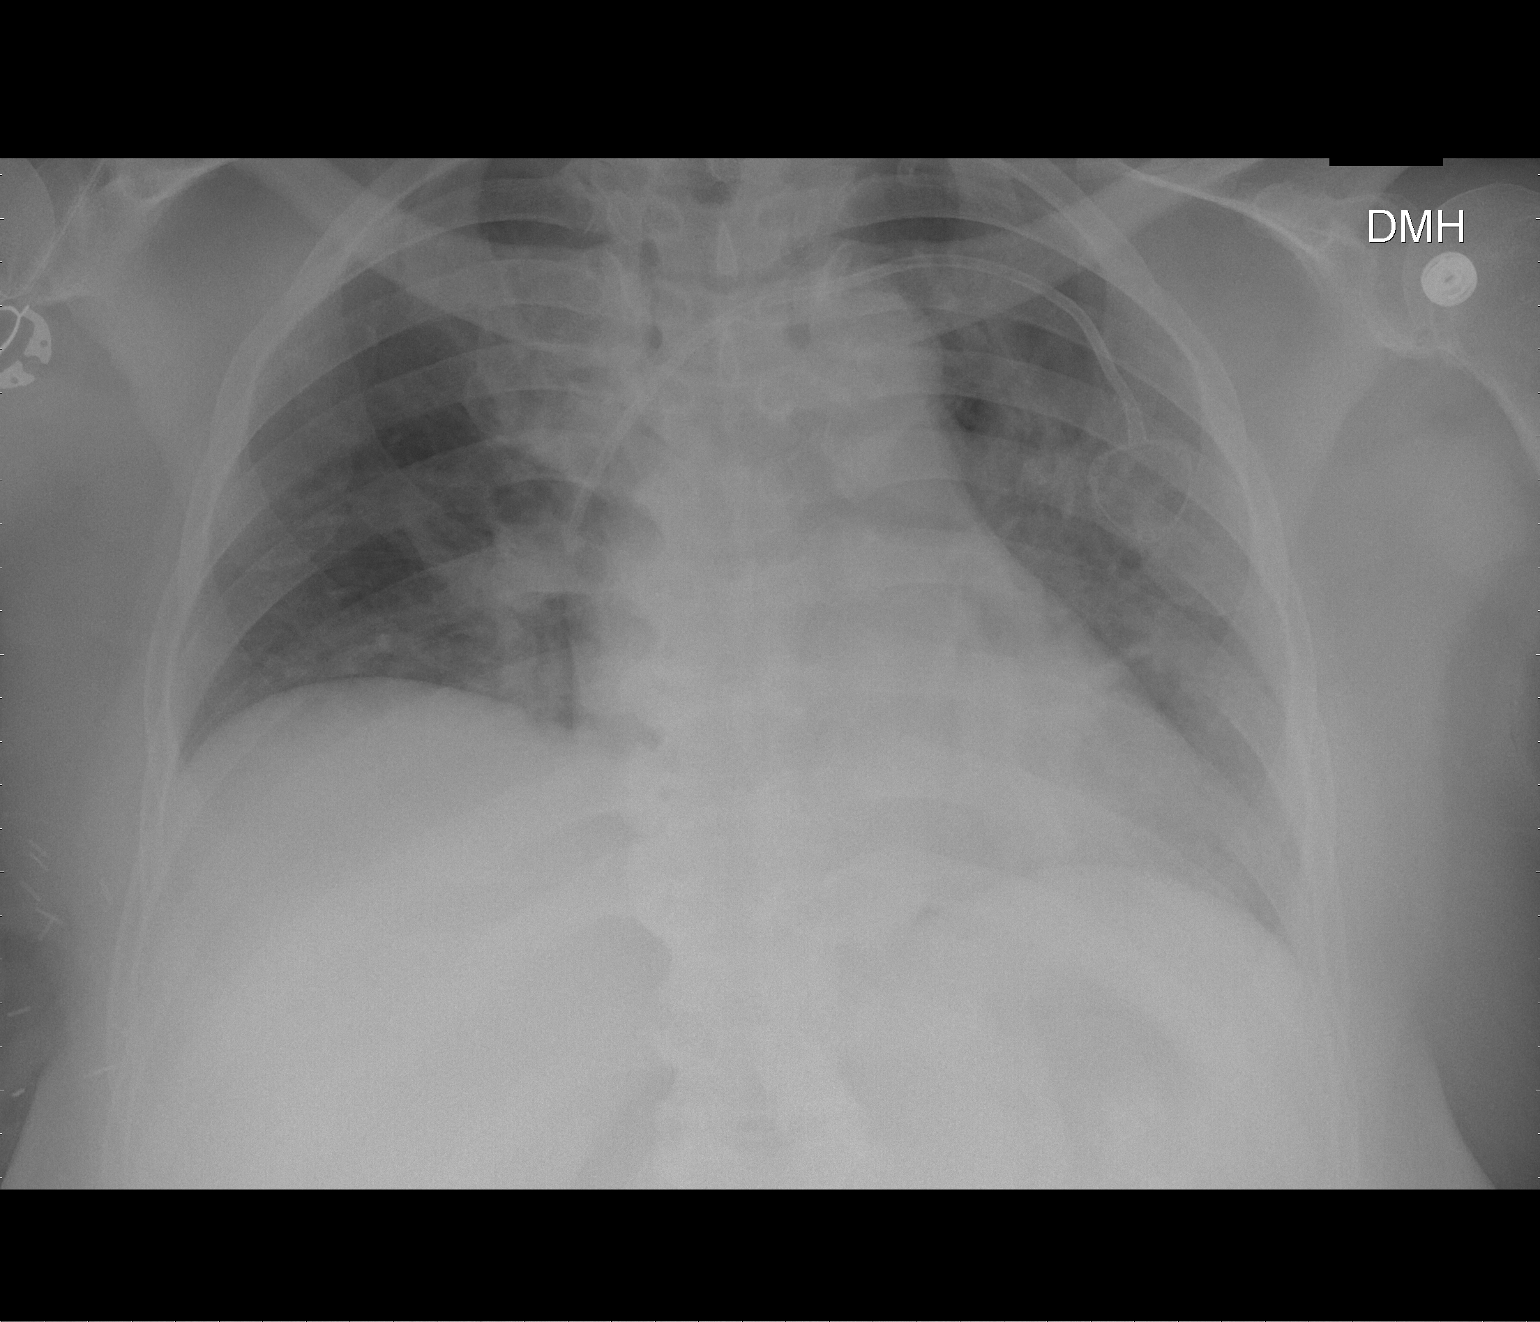

[1 of 1 positions shown; findings below may reference images not displayed]

FINDINGS: LEFT-sided power port with tip in the distal SVC. Normal cardiac
silhouette. There is increase in central venous congestion compared
to prior. No pneumothorax.
IMPRESSION: LEFT power port with tip in the distal SVC.  No pneumothorax.

Increased central venous congestion.

## 2017-02-06 ENCOUNTER — Other Ambulatory Visit: Payer: Self-pay

## 2017-02-06 ENCOUNTER — Telehealth: Payer: Self-pay

## 2017-02-06 DIAGNOSIS — C50411 Malignant neoplasm of upper-outer quadrant of right female breast: Secondary | ICD-10-CM

## 2017-02-06 NOTE — Telephone Encounter (Signed)
Pt called in concerned because she had noticed a large lump under her armpit where she had her lumpectomy.Pt states its uncomfortable but she is not having a lot of pain, just discomfort and firm. Pt states that she had noticed the lump 2 weeks ago and is very concerned. Told pt that she will need to do a diagnostic mm/us to make sure nothing is of a concern. Will schedule pt at [facility]. Will call to verify time/date of appt with pt and will schedule follow up appt with Dr.Gudena in 2 weeks.Pt verbalized understanding.  Called pt back to confirm appt for breast center and f/u with Dr.Gudena next week.

## 2017-02-08 ENCOUNTER — Other Ambulatory Visit: Payer: Self-pay

## 2017-02-08 DIAGNOSIS — C50411 Malignant neoplasm of upper-outer quadrant of right female breast: Secondary | ICD-10-CM

## 2017-02-09 ENCOUNTER — Other Ambulatory Visit: Payer: Medicaid Other

## 2017-02-10 ENCOUNTER — Other Ambulatory Visit: Payer: Self-pay

## 2017-02-10 ENCOUNTER — Telehealth: Payer: Self-pay

## 2017-02-10 NOTE — Telephone Encounter (Signed)
Called pt to let her know that mm/us of the right breast will be cancelled until further evaluation by Dr.Gudena. Pt confirmed appt date/time next Monday (02/13/2017) with Dr.Gudena. Called pt mother to let her know of appt as well. Pt mother is always with pt during dr visits and appreciates call.

## 2017-02-13 ENCOUNTER — Ambulatory Visit: Payer: Medicaid Other | Admitting: Hematology and Oncology

## 2017-02-13 ENCOUNTER — Encounter: Payer: Self-pay | Admitting: Hematology and Oncology

## 2017-02-13 ENCOUNTER — Ambulatory Visit (HOSPITAL_BASED_OUTPATIENT_CLINIC_OR_DEPARTMENT_OTHER): Payer: Medicaid Other | Admitting: Hematology and Oncology

## 2017-02-13 VITALS — BP 124/79 | HR 101 | Temp 97.5°F | Resp 18 | Ht 61.5 in | Wt 170.0 lb

## 2017-02-13 DIAGNOSIS — C50411 Malignant neoplasm of upper-outer quadrant of right female breast: Secondary | ICD-10-CM

## 2017-02-13 DIAGNOSIS — Z171 Estrogen receptor negative status [ER-]: Secondary | ICD-10-CM

## 2017-02-13 DIAGNOSIS — Z1501 Genetic susceptibility to malignant neoplasm of breast: Secondary | ICD-10-CM

## 2017-02-13 DIAGNOSIS — Z1509 Genetic susceptibility to other malignant neoplasm: Secondary | ICD-10-CM

## 2017-02-13 NOTE — Assessment & Plan Note (Signed)
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 Additionally, one variant of uncertain significance, called "c.16A>C (p.Thr6Pro)" was found in one copy of the MSH6gene. 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 Adjuvant chemotherapy (Adriamycin and Cytoxan dose dense 4 followed by Abraxane weekly 12) started 02/11/2016 completed 06/30/2016 2. Followed by radiation started 07/25/2016 completed 09/05/2016  Treatment plan:  1. Surveillance with annual mammograms 10/27/2016: Benign breast density category C 2. Breast MRIs Which will be done 6 months from the mammogram  2. bilateral salpingo-oophorectomy (BRCA2 mutation)   Return to clinic in 1 year  for surveillance exams and follow-up.

## 2017-02-13 NOTE — Progress Notes (Signed)
 Patient Care Team: Gerald Hill, MD as PCP - General (Family Medicine)  DIAGNOSIS:  Encounter Diagnoses  Name Primary?  . Malignant neoplasm of upper-outer quadrant of right breast in female, estrogen receptor negative (HCC)   . BRCA gene mutation positive Yes    SUMMARY OF ONCOLOGIC HISTORY:   Breast cancer of upper-outer quadrant of right female breast (HCC)   11/25/2015 Mammogram    Right breast mass 12 cm from the nipple: 1.4 x 1.3 x 0.9 cm, two right axillary lymph nodes with borderline cortical thickening measuring up to 3 mm      12/04/2015 Initial Diagnosis    Right breast biopsy 10:00: Invasive ductal carcinoma with DCIS, grade 2, ER 0%, PR 0%, HER-2 negative, ratio1.31, Ki-67 40%      01/13/2016 Procedure    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 Additionally, 1 VUS, called "c.16A>C (p.Thr6Pro)" was found in one copy of the MSH6 gene. Genes analyzed: ATM, BARD1, BRCA1, BRCA2, BRIP1, CDH1, CHEK2, FANCC, MLH1, MSH2, MSH6, NBN, PALB2, PMS2, PTEN, RAD51C, RAD51D, TP53, and XRCC2.  This panel also includes deletion/duplication analysis (without sequencing) for one gene, EPCAM      01/20/2016 Surgery    Rt Lumpectomy ([facility]): IDC 1.7 cm, grade 3, with DCIS, 0/2 LN, ER 0%, PR 0%, Her 2 Neg Ratio 1.35, Ki 67: 40%, T1CN0 (stage 1A)      02/11/2016 - 06/30/2016 Chemotherapy    Adjuvant chemotherapy with dose dense Adriamycin and Cytoxan 4 followed by Abraxane weekly 12      07/25/2016 - 09/05/2016 Radiation Therapy    Adjuvant radiation therapy ([place]). Right breast / 50 Gy in 25 fractions.  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 She complains of a lump underneath the right arm. This is been  going on for 2 weeks. We try to obtain a mammogram and ultrasound but her insurance denied it. She does not have any pain or discomfort associated with it.  REVIEW OF SYSTEMS:   Constitutional: Denies fevers, chills or abnormal weight loss Eyes: Denies blurriness of vision Ears, nose, mouth, throat, and face: Denies mucositis or sore throat Respiratory: Denies cough, dyspnea or wheezes Cardiovascular: Denies palpitation, chest discomfort Gastrointestinal:  Denies nausea, heartburn or change in bowel habits Skin: Denies abnormal skin rashes Lymphatics: Denies new lymphadenopathy or easy bruising Neurological:Denies numbness, tingling or new weaknesses Behavioral/Psych: Mood is stable, no new changes  Extremities: No lower extremity edema Breast: Lump under the rt arm  All other systems were reviewed with the patient and are negative.  I have reviewed the past medical history, past surgical history, social history and family history with the patient and they are unchanged from previous note.  ALLERGIES:  has No Known Allergies.  MEDICATIONS:  Current Outpatient Prescriptions  Medication Sig Dispense Refill  . acetaminophen (TYLENOL) 325 MG tablet Take 650 mg by mouth every 6 (six) hours as needed for mild pain.    . benztropine (COGENTIN) 0.5 MG tablet Take 0.5 mg by mouth 2 (two) times daily.    . carboxymethylcellulose (REFRESH CELLUVISC) 1 % ophthalmic solution Place 1 drop into both eyes 4 (four) times daily.     . carvedilol (COREG) 3.125 MG tablet TAKE 1 TABLET BY MOUTH TWICE DAILY. 60 tablet 3  .   divalproex (DEPAKOTE ER) 500 MG 24 hr tablet Take 1,000 mg by mouth at bedtime.    . furosemide (LASIX) 40 MG tablet Take 40 mg by mouth every morning.    Marland Kitchen Lifitegrast (XIIDRA) 5 % SOLN Place 1 drop into both eyes 2 (two) times daily.    Marland Kitchen lisinopril (PRINIVIL,ZESTRIL) 10 MG tablet Take 10 mg by mouth every morning.     . metFORMIN (GLUCOPHAGE) 500 MG tablet Take by mouth 2 (two) times  daily with a meal.    . OLANZapine (ZYPREXA) 10 MG tablet Take 10 mg by mouth at bedtime.    . Olopatadine HCl (PAZEO) 0.7 % SOLN Apply 1 drop to eye every evening. 1 drop in each eye every evening.    Marland Kitchen omeprazole (PRILOSEC) 20 MG capsule Take 40 mg by mouth every morning.    . simvastatin (ZOCOR) 10 MG tablet Take 10 mg by mouth at bedtime.     No current facility-administered medications for this visit.    Facility-Administered Medications Ordered in Other Visits  Medication Dose Route Frequency Provider Last Rate Last Dose  . 0.9 %  sodium chloride infusion   Intravenous Once Nicholas Lose, MD      . sodium chloride flush (NS) 0.9 % injection 10 mL  10 mL Intracatheter PRN Nicholas Lose, MD   10 mL at 04/14/16 1127    PHYSICAL EXAMINATION: ECOG PERFORMANCE STATUS: 1 - Symptomatic but completely ambulatory  Vitals:   02/13/17 0810  BP: 124/79  Pulse: (!) 101  Resp: 18  Temp: 97.5 F (36.4 C)   Filed Weights   02/13/17 0810  Weight: 170 lb (77.1 kg)    GENERAL:alert, no distress and comfortable SKIN: skin color, texture, turgor are normal, no rashes or significant lesions EYES: normal, Conjunctiva are pink and non-injected, sclera clear OROPHARYNX:no exudate, no erythema and lips, buccal mucosa, and tongue normal  NECK: supple, thyroid normal size, non-tender, without nodularity LYMPH:  no palpable lymphadenopathy in the cervical, axillary or inguinal LUNGS: clear to auscultation and percussion with normal breathing effort HEART: regular rate & rhythm and no murmurs and no lower extremity edema ABDOMEN:abdomen soft, non-tender and normal bowel sounds MUSCULOSKELETAL:no cyanosis of digits and no clubbing  NEURO: alert & oriented x 3 with fluent speech, no focal motor/sensory deficits EXTREMITIES: No lower extremity edema BREAST: Palpable lump under the right axilla along the surgical scar tissue in the axilla yet (exam performed in the presence of a chaperone)  LABORATORY  DATA:  I have reviewed the data as listed   Chemistry      Component Value Date/Time   NA 143 09/23/2016 1000   K 4.2 09/23/2016 1000   CL 102 01/14/2016 1420   CO2 28 09/23/2016 1000   BUN 11.5 09/23/2016 1000   CREATININE 0.8 09/23/2016 1000      Component Value Date/Time   CALCIUM 9.3 09/23/2016 1000   ALKPHOS 88 09/23/2016 1000   AST 20 09/23/2016 1000   ALT 16 09/23/2016 1000   BILITOT 0.25 09/23/2016 1000       Lab Results  Component Value Date   WBC 3.0 (L) 09/23/2016   HGB 11.4 (L) 09/23/2016   HCT 34.2 (L) 09/23/2016   MCV 85.1 09/23/2016   PLT 176 09/23/2016   NEUTROABS 1.7 09/23/2016    ASSESSMENT & PLAN:  Breast cancer of upper-outer quadrant of right female breast ([place]) Rt Lumpectomy 01/20/16: IDC 1.7 cm, grade 3, with DCIS, 0/2 LN, ER 0%, PR 0%, Her  2 Neg Ratio 1.35, Ki 67: 40%, T1CN0 (stage 1A) (patient has mental handicap)  BRCA2 MutationPositive c.[id number] (p.[id number])". Additionally, one variant of uncertain significance, called "c.16A>C (p.Thr6Pro)" was found in one copy of the MSH6gene. 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 Adjuvant chemotherapy (Adriamycin and Cytoxan dose dense 4 followed by Abraxane weekly 12) started 02/11/2016 completed 06/30/2016 2. Followed by radiation started 07/25/2016 completed 09/05/2016  Treatment plan:  1. Surveillance with annual mammograms 10/27/2016: Benign breast density category C 2. Breast MRIs Which will be done in 3 months  3. bilateral salpingo-oophorectomy (BRCA2 mutation): I sent a referral to GYN oncology  Palpable lump under the right axilla: I recommended obtaining an immediate ultrasound evaluation We will try to resubmitted to her insurance regarding these new findings.  Return to clinic in 6 months  for surveillance exams and follow-up.   I spent 25 minutes talking  to the patient of which more than half was spent in counseling and coordination of care.  Orders Placed This Encounter  Procedures  . US BREAST LTD UNI RIGHT INC AXILLA    Standing Status:   Future    Standing Expiration Date:   04/15/2018    Order Specific Question:   Reason for Exam (SYMPTOM  OR DIAGNOSIS REQUIRED)    Answer:   Right axillary lump, new with a prior history of triple negative breast cancer    Order Specific Question:   Preferred imaging location?    Answer:   [facility]  . MR BREAST BILATERAL W WO CONTRAST    BRCA2 mutation annual evaluation    Standing Status:   Future    Standing Expiration Date:   04/15/2018    Order Specific Question:   If indicated for the ordered procedure, I authorize the administration of contrast media per Radiology protocol    Answer:   Yes    Order Specific Question:   Reason for Exam (SYMPTOM  OR DIAGNOSIS REQUIRED)    Answer:   BRCA2 mutation high risk for breast cancer recurrence    Order Specific Question:   Preferred imaging location?    Answer:   [facility] (table limit-550lbs)    Order Specific Question:   Does the patient have a pacemaker or implanted devices?    Answer:   No    Order Specific Question:   What is the patient's sedation requirement?    Answer:   No Sedation  . Ambulatory referral to Gynecologic Oncology    Referral Priority:   Routine    Referral Type:   Consultation    Referral Reason:   Specialty Services Required    Requested Specialty:   Oncology    Number of Visits Requested:   1   The patient has a good understanding of the overall plan. she agrees with it. she will call with any problems that may develop before the next visit here.   Rulon Eisenmenger, MD 02/13/17

## 2017-02-16 ENCOUNTER — Ambulatory Visit
Admission: RE | Admit: 2017-02-16 | Discharge: 2017-02-16 | Disposition: A | Discharge status: Home / Self Care | Payer: Medicaid Other | Source: Ambulatory Visit | Attending: Hematology and Oncology | Admitting: Hematology and Oncology

## 2017-02-16 ENCOUNTER — Other Ambulatory Visit: Payer: Self-pay | Admitting: Hematology and Oncology

## 2017-02-16 DIAGNOSIS — Z171 Estrogen receptor negative status [ER-]: Secondary | ICD-10-CM

## 2017-02-16 DIAGNOSIS — C50411 Malignant neoplasm of upper-outer quadrant of right female breast: Secondary | ICD-10-CM

## 2017-02-22 ENCOUNTER — Encounter: Payer: Self-pay | Admitting: Hematology and Oncology

## 2017-02-22 ENCOUNTER — Telehealth: Payer: Self-pay | Admitting: Gynecologic Oncology

## 2017-02-22 ENCOUNTER — Encounter: Payer: Self-pay | Admitting: Gynecologic Oncology

## 2017-02-22 NOTE — Telephone Encounter (Signed)
Appt has been scheduled to see Dr. Skeet Latch on 4/2. Unable to reach the pt with the appt date and time. Letter mailed to the pt.

## 2017-03-13 ENCOUNTER — Ambulatory Visit: Payer: Medicaid Other | Attending: Gynecologic Oncology | Admitting: Gynecologic Oncology

## 2017-03-13 ENCOUNTER — Ambulatory Visit (HOSPITAL_BASED_OUTPATIENT_CLINIC_OR_DEPARTMENT_OTHER): Payer: Medicaid Other

## 2017-03-13 ENCOUNTER — Encounter: Payer: Self-pay | Admitting: Gynecologic Oncology

## 2017-03-13 VITALS — BP 109/72 | HR 91 | Temp 98.3°F | Resp 18 | Ht 64.06 in | Wt 167.8 lb

## 2017-03-13 DIAGNOSIS — Z803 Family history of malignant neoplasm of breast: Secondary | ICD-10-CM | POA: Diagnosis not present

## 2017-03-13 DIAGNOSIS — Z90710 Acquired absence of both cervix and uterus: Secondary | ICD-10-CM | POA: Diagnosis not present

## 2017-03-13 DIAGNOSIS — I10 Essential (primary) hypertension: Secondary | ICD-10-CM | POA: Insufficient documentation

## 2017-03-13 DIAGNOSIS — Z1501 Genetic susceptibility to malignant neoplasm of breast: Secondary | ICD-10-CM

## 2017-03-13 DIAGNOSIS — Z9221 Personal history of antineoplastic chemotherapy: Secondary | ICD-10-CM | POA: Insufficient documentation

## 2017-03-13 DIAGNOSIS — Z87891 Personal history of nicotine dependence: Secondary | ICD-10-CM | POA: Insufficient documentation

## 2017-03-13 DIAGNOSIS — Z8042 Family history of malignant neoplasm of prostate: Secondary | ICD-10-CM | POA: Diagnosis not present

## 2017-03-13 DIAGNOSIS — K219 Gastro-esophageal reflux disease without esophagitis: Secondary | ICD-10-CM | POA: Diagnosis not present

## 2017-03-13 DIAGNOSIS — Z7984 Long term (current) use of oral hypoglycemic drugs: Secondary | ICD-10-CM | POA: Diagnosis not present

## 2017-03-13 DIAGNOSIS — Z1509 Genetic susceptibility to other malignant neoplasm: Secondary | ICD-10-CM

## 2017-03-13 DIAGNOSIS — E119 Type 2 diabetes mellitus without complications: Secondary | ICD-10-CM | POA: Diagnosis present

## 2017-03-13 DIAGNOSIS — E139 Other specified diabetes mellitus without complications: Secondary | ICD-10-CM

## 2017-03-13 DIAGNOSIS — E785 Hyperlipidemia, unspecified: Secondary | ICD-10-CM | POA: Diagnosis not present

## 2017-03-13 DIAGNOSIS — Z1502 Genetic susceptibility to malignant neoplasm of ovary: Secondary | ICD-10-CM

## 2017-03-13 DIAGNOSIS — Z853 Personal history of malignant neoplasm of breast: Secondary | ICD-10-CM | POA: Diagnosis present

## 2017-03-13 DIAGNOSIS — F329 Major depressive disorder, single episode, unspecified: Secondary | ICD-10-CM | POA: Insufficient documentation

## 2017-03-13 DIAGNOSIS — Z79899 Other long term (current) drug therapy: Secondary | ICD-10-CM | POA: Diagnosis not present

## 2017-03-13 DIAGNOSIS — Z8041 Family history of malignant neoplasm of ovary: Secondary | ICD-10-CM

## 2017-03-13 DIAGNOSIS — F209 Schizophrenia, unspecified: Secondary | ICD-10-CM | POA: Insufficient documentation

## 2017-03-13 DIAGNOSIS — Z923 Personal history of irradiation: Secondary | ICD-10-CM | POA: Diagnosis not present

## 2017-03-13 NOTE — Progress Notes (Signed)
Consult Note: Gyn-Onc  Consult was requested by Dr. Olivia Mackie the evaluation of Alison [name] 56 y.o. female  CC:  Chief Complaint  Patient presents with  . BRCA Gene Mutation Positive    Assessment/Plan:  Alison [name] is a 56 y.o. wiht 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 Additionally, 1 VUS, called "c.16A>C (p.Thr6Pro)" was found in one copy of the MSH6 gene . Physical examination is notable for the absence of the cervix and a uterus is not palpable. Adnexa additionally are not assessable, review of her medical record is notable for documentation of a vaginal hysterectomy in the past surgical history on 1 document. A pelvic ultrasound will be requested to evaluate the adnexa and confirm the absence of her uterus. If the adnexa are present the recommendation is for risk reducing bilateral salpingo-oophorectomy. If the uterus is in placeMs. [name] will be counseled about hysterectomy given that [facility] 6 gene mutation of unknown significance.  A surgery date of May 15 with Dr. Denman George is tentatively in place with the plan for robotic-assisted bilateral salpingo-oophorectomy with washings..  The risks of this procedure been discussed with [name] and her mother [name] and inclusive of infection bleeding damage to surrounding structures and prolonged hospitalization. A CA-125 will be obtained today as will a hemoglobin A1c to assess the management of her glucose.  [name] and [name] will follow-up in my clinic on May 10 to finalize surgical planning and to discuss in more depth the infant's of the procedure   HPI: Alison [name] is a 56 y.o.   5 para 5 with a diagnosis of a right upper outer quadrant breast mass in December 2016. She subsequently has undergone lumpectomy and adjuvant chemotherapy Adriamycin and Cytoxan followed by radiation therapy which was completed in September 2017. Patient assessment was performed on for every first 2017 and  notable for 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 Additionally, 1 VUS, called "c.16A>C (p.Thr6Pro)" was found in one copy of the MSH6 gene .  Alison [name]  Has a diagnosis of schizophrenia and defers to her mother for her medical history. They  report NSVD 4, one cesarean section and a bilateral tubal ligation. Family history is notable for mother with breast cancer diagnosed at the age of 42 a maternal aunt with ovarian cancer diagnosed in his 5s a maternal cousin diagnosed with breast cancer at an unspecified age. There are 2 maternal uncles with prostate cancer diagnosed at an unspecified age and a brother with vocal cord cancer.   Alison [name] is unclear about when she became menopausal. Both she and her mother appear unaware of a prior hysterectomy.  Review of the medical record is notable for a note in 2015 within the past surgical history documentation of a laparoscopic vaginal hysterectomy. An MRI of the hip in 2007 notes that the uterus is not visualized.  Review of Systems:  Constitutional  Feels well,  Cardiovascular  No chest pain, shortness of breath, or edema  Pulmonary  No cough or wheeze.  Gastro Intestinal  No nausea, vomitting, or diarrhoea. No bright red blood per rectum, no abdominal pain, change in bowel movement, or constipation. No abdominal bloating excellent appetite Genito Urinary  No frequency, urgency, dysuria, no vaginal bleeding Musculo Skeletal  No myalgia, arthralgia, joint swelling or pain  Neurologic  No weakness, numbness, change in gait,  Psychology  No depression, anxiety, insomnia.    Current Meds:  Outpatient Encounter Prescriptions as of 03/13/2017  Medication Sig  . acetaminophen (TYLENOL)  325 MG tablet Take 650 mg by mouth every 6 (six) hours as needed for mild pain.  . benztropine (COGENTIN) 0.5 MG tablet Take 0.5 mg by mouth 2 (two) times daily.  . carboxymethylcellulose (REFRESH CELLUVISC) 1 % ophthalmic  solution Place 1 drop into both eyes 4 (four) times daily.   . carvedilol (COREG) 3.125 MG tablet TAKE 1 TABLET BY MOUTH TWICE DAILY.  . divalproex (DEPAKOTE ER) 500 MG 24 hr tablet Take 1,000 mg by mouth at bedtime.  . furosemide (LASIX) 40 MG tablet Take 40 mg by mouth every morning.  Marland Kitchen Lifitegrast (XIIDRA) 5 % SOLN Place 1 drop into both eyes 2 (two) times daily.  Marland Kitchen lisinopril (PRINIVIL,ZESTRIL) 10 MG tablet Take 10 mg by mouth every morning.   . metFORMIN (GLUCOPHAGE) 500 MG tablet Take by mouth 2 (two) times daily with a meal.  . OLANZapine (ZYPREXA) 10 MG tablet Take 10 mg by mouth at bedtime.  . Olopatadine HCl (PAZEO) 0.7 % SOLN Apply 1 drop to eye every evening. 1 drop in each eye every evening.  Marland Kitchen omeprazole (PRILOSEC) 20 MG capsule Take 40 mg by mouth every morning.  . simvastatin (ZOCOR) 10 MG tablet Take 10 mg by mouth at bedtime.   Facility-Administered Encounter Medications as of 03/13/2017  Medication  . 0.9 %  sodium chloride infusion  . sodium chloride flush (NS) 0.9 % injection 10 mL    Allergy: No Known Allergies  Social Hx:   Social History   Social History  . Marital status: Single    Spouse name: N/A  . Number of children: N/A  . Years of education: N/A   Occupational History  . Not on file.   Social History Main Topics  . Smoking status: Former Smoker    Packs/day: 0.25    Types: Cigarettes    Quit date: 12/2015  . Smokeless tobacco: Never Used  . Alcohol use Yes     Comment: "only on special occasions"   . Drug use: No  . Sexual activity: Yes    Birth control/ protection: Surgical   Other Topics Concern  . Not on file   Social History Narrative  . No narrative on file    Past Surgical Hx:  Past Surgical History:  Procedure Laterality Date  . ABDOMINAL HYSTERECTOMY    . BREAST LUMPECTOMY Right 2017  . BREAST LUMPECTOMY WITH RADIOACTIVE SEED AND SENTINEL LYMPH NODE BIOPSY Right 01/20/2016   Procedure: RIGHT BREAST LUMPECTOMY WITH RADIOACTIVE  SEED AND SENTINEL LYMPH NODE BIOPSY;  Surgeon: Coralie Keens, MD;  Location: [place];  Service: General;  Laterality: Right;  . CHOLECYSTECTOMY N/A 04/16/2014   Procedure: LAPAROSCOPIC CHOLECYSTECTOMY ;  Surgeon: [name]. Georgette Dover, MD;  Location: [facility];  Service: General;  Laterality: N/A;  . PORT-A-CATH REMOVAL Left 10/13/2016   Procedure: REMOVAL PORT-A-CATH;  Surgeon: Coralie Keens, MD;  Location: [facility];  Service: General;  Laterality: Left;  . PORTACATH PLACEMENT Left 01/20/2016   Procedure: INSERTION PORT-A-CATH;  Surgeon: Coralie Keens, MD;  Location: [place];  Service: General;  Laterality: Left;    Past Medical Hx:  Past Medical History:  Diagnosis Date  . Anemia   . Cancer ([place])   . Depression    chronic   . Diabetes mellitus without complication ([place])    type II  . Gallstones   . GERD (gastroesophageal reflux disease)   . History of radiation therapy 07/25/16- 09/05/16   Right Breast  . Hyperlipidemia   .  Hypertension   . Schizophrenia ([place])   Bilateral cataracts   Past Gynecological History: Gravida 5 para 5 unknown dates of menarche or menopause. Reports prior bilateral tubal ligation, reports a normal Pap test last year and denies any history of abnormal Pap tests  No LMP recorded. Patient has had a hysterectomy. Per the medical record  Family Hx:  Family History  Problem Relation Age of Onset  . Breast cancer Mother 37    s/p partial mastectomy  . Skin cancer Mother     on right knee; unspecified type  . Colon polyps Mother     three total  . Other Mother 66    history of a TAH-BSO for bleeding  . Prostate cancer Maternal Uncle     (x2 maternal uncles)  . Cirrhosis Maternal Grandfather     +EtOH  . Cancer Brother 55    vocal cord ca; not a smoker  . Autism Brother   . Cancer Maternal Uncle     vocal cord ca; +smoker  . Ovarian cancer Maternal Aunt     dx. >50  . Cancer Maternal Aunt     unspecified type  .  Diabetes Maternal Aunt   . Colon cancer Cousin     (x2 maternal first cousins--sisters)  . Breast cancer Cousin     dx. 66 or younger  . Cancer Other     NOS cancer; lim info; all three of his daughters had cancer as well  . Breast cancer Other     Vitals:  Blood pressure 109/72, pulse 91, temperature 98.3 F (36.8 C), temperature source Oral, resp. rate 18, height 5' 4.06" (1.627 m), weight 167 lb 12.8 oz (76.1 kg).  Physical Exam: WD in NAD Neck  Supple NROM, without any enlargements.  Lymph Node Survey No cervical supraclavicular or inguinal adenopathy Cardiovascular  Pulse normal rate, regularity and rhythm.  Lungs  Clear to auscultation bilaterally, Good air movement.  Skin  No rash/lesions/breakdown  Psychiatry  Alert and oriented appropriate mood affect speech and reasoning. Abdomen  Normoactive bowel sounds, abdomen soft, non-tender. Midline vertical incision intact without evidence of hernia.  Back No CVA tenderness Genito Urinary  Vulva/vagina: Normal external female genitalia.  No lesions. No discharge or bleeding.  Bladder/urethra:  No lesions or masses  Vagina: atrophic no masseas   Cervix:absent  Uterus: absent  Adnexa: No palpable masses. No cul de sac nodularity Rectal  Good tone, no masses no cul de sac nodularity.  Extremities  No bilateral cyanosis, clubbing or edema.   Janie Morning, MD, PhD 03/13/2017, 10:42 AM

## 2017-03-13 NOTE — Patient Instructions (Signed)
For ultrasound appointment you will need to have a full bladder. The test is scheduled for April 10, at 11 am at [facility] ultrasound department. At your follow up appointment with Dr Skeet Latch on 04/20/17 we will give you details for your surgical procedure.

## 2017-03-14 LAB — HEMOGLOBIN A1C
Est. average glucose Bld gHb Est-mCnc: 126 mg/dL
Hemoglobin A1c: 6 % — ABNORMAL HIGH (ref 4.8–5.6)

## 2017-03-14 LAB — CA 125: Cancer Antigen (CA) 125: 9.9 U/mL (ref 0.0–38.1)

## 2017-03-21 ENCOUNTER — Ambulatory Visit (HOSPITAL_COMMUNITY)
Admission: RE | Admit: 2017-03-21 | Discharge: 2017-03-21 | Disposition: A | Discharge status: Home / Self Care | Payer: Medicaid Other | Source: Ambulatory Visit | Attending: Gynecologic Oncology | Admitting: Gynecologic Oncology

## 2017-03-21 DIAGNOSIS — Z1502 Genetic susceptibility to malignant neoplasm of ovary: Secondary | ICD-10-CM | POA: Diagnosis present

## 2017-03-21 DIAGNOSIS — E109 Type 1 diabetes mellitus without complications: Secondary | ICD-10-CM | POA: Insufficient documentation

## 2017-03-21 DIAGNOSIS — Z1501 Genetic susceptibility to malignant neoplasm of breast: Secondary | ICD-10-CM

## 2017-03-21 DIAGNOSIS — Z803 Family history of malignant neoplasm of breast: Secondary | ICD-10-CM | POA: Diagnosis not present

## 2017-03-21 DIAGNOSIS — Z90710 Acquired absence of both cervix and uterus: Secondary | ICD-10-CM | POA: Diagnosis not present

## 2017-03-21 DIAGNOSIS — Z8041 Family history of malignant neoplasm of ovary: Secondary | ICD-10-CM | POA: Diagnosis not present

## 2017-03-21 DIAGNOSIS — E139 Other specified diabetes mellitus without complications: Secondary | ICD-10-CM

## 2017-03-21 DIAGNOSIS — Z1509 Genetic susceptibility to other malignant neoplasm: Secondary | ICD-10-CM

## 2017-03-24 ENCOUNTER — Encounter (HOSPITAL_COMMUNITY): Payer: Self-pay

## 2017-03-24 ENCOUNTER — Ambulatory Visit: Payer: Medicaid Other | Admitting: Hematology and Oncology

## 2017-04-13 ENCOUNTER — Telehealth: Payer: Self-pay | Admitting: Gynecologic Oncology

## 2017-04-13 NOTE — Telephone Encounter (Signed)
Patient's caretaker called and stated the patient has too many things on May 10 and would need to reschedule her appt.  Not wanting to see another provider.  Appt rescheduled.

## 2017-04-14 ENCOUNTER — Telehealth: Payer: Self-pay | Admitting: *Deleted

## 2017-04-14 NOTE — Telephone Encounter (Signed)
Patient's caregiver called and moved the appt from July to June 7th.

## 2017-04-14 NOTE — Telephone Encounter (Signed)
I have called and left a message for the patient's caretaker to call the office back. We can move the appt form July to June 7th.

## 2017-04-20 ENCOUNTER — Ambulatory Visit: Payer: Medicaid Other | Admitting: Gynecologic Oncology

## 2017-05-18 ENCOUNTER — Ambulatory Visit: Payer: Medicaid Other | Attending: Gynecologic Oncology | Admitting: Gynecologic Oncology

## 2017-05-18 ENCOUNTER — Ambulatory Visit: Payer: Medicaid Other | Admitting: Hematology and Oncology

## 2017-05-18 ENCOUNTER — Encounter: Payer: Self-pay | Admitting: Gynecologic Oncology

## 2017-05-18 VITALS — BP 130/87 | HR 88 | Temp 98.5°F | Resp 20 | Wt 173.5 lb

## 2017-05-18 DIAGNOSIS — E119 Type 2 diabetes mellitus without complications: Secondary | ICD-10-CM | POA: Diagnosis not present

## 2017-05-18 DIAGNOSIS — E785 Hyperlipidemia, unspecified: Secondary | ICD-10-CM | POA: Diagnosis not present

## 2017-05-18 DIAGNOSIS — K219 Gastro-esophageal reflux disease without esophagitis: Secondary | ICD-10-CM | POA: Diagnosis not present

## 2017-05-18 DIAGNOSIS — Z7984 Long term (current) use of oral hypoglycemic drugs: Secondary | ICD-10-CM | POA: Insufficient documentation

## 2017-05-18 DIAGNOSIS — F209 Schizophrenia, unspecified: Secondary | ICD-10-CM | POA: Insufficient documentation

## 2017-05-18 DIAGNOSIS — F329 Major depressive disorder, single episode, unspecified: Secondary | ICD-10-CM | POA: Insufficient documentation

## 2017-05-18 DIAGNOSIS — Z1501 Genetic susceptibility to malignant neoplasm of breast: Secondary | ICD-10-CM

## 2017-05-18 DIAGNOSIS — Z79899 Other long term (current) drug therapy: Secondary | ICD-10-CM | POA: Insufficient documentation

## 2017-05-18 DIAGNOSIS — Z8041 Family history of malignant neoplasm of ovary: Secondary | ICD-10-CM | POA: Insufficient documentation

## 2017-05-18 DIAGNOSIS — I10 Essential (primary) hypertension: Secondary | ICD-10-CM | POA: Diagnosis not present

## 2017-05-18 DIAGNOSIS — Z87891 Personal history of nicotine dependence: Secondary | ICD-10-CM | POA: Insufficient documentation

## 2017-05-18 DIAGNOSIS — N6311 Unspecified lump in the right breast, upper outer quadrant: Secondary | ICD-10-CM | POA: Insufficient documentation

## 2017-05-18 DIAGNOSIS — Z1509 Genetic susceptibility to other malignant neoplasm: Secondary | ICD-10-CM | POA: Diagnosis present

## 2017-05-18 DIAGNOSIS — Z8042 Family history of malignant neoplasm of prostate: Secondary | ICD-10-CM | POA: Diagnosis not present

## 2017-05-18 DIAGNOSIS — Z803 Family history of malignant neoplasm of breast: Secondary | ICD-10-CM | POA: Diagnosis not present

## 2017-05-18 DIAGNOSIS — Z808 Family history of malignant neoplasm of other organs or systems: Secondary | ICD-10-CM | POA: Diagnosis not present

## 2017-05-18 NOTE — Patient Instructions (Signed)
Preparing for your Surgery  Plan for surgery on June 21,2018 at [facility]  with Dr. Janie Morning  Procedure is a Robotic Assisted Bilateral Salpingo-oophorectomy Bilateral Salpingo-Oophorectomy Bilateral salpingo-oophorectomy is the surgical removal of both fallopian tubes and both ovaries. The ovaries are reproductive organs that produce eggs in women. The fallopian tubes allow eggs to move from the ovaries to the uterus. You may need this procedure if you:  Have had your uterus removed. This procedure is usually done after the uterus is removed.  Have cancer of the fallopian tubes or ovaries.  Have a high risk of cancer of the fallopian tubes or ovaries.  There are three different techniques that can be used for this procedure:  Open. One large incision will be made in your abdomen.  Laparoscopic. A thin, lighted tube with a small camera on the end (laparoscope) will be used to help perform the procedure. The laparoscope will allow your surgeon to make several small incisions in the abdomen instead of one large incision.  Robot-assisted. A computer will be used to control surgical instruments that are attached to robotic arms. A laparoscope may also be used with this technique.  As a result of this procedure, you will become sterile (unable to become pregnant), and you will go into menopause (no longer able to have menstrual periods). You may develop symptoms of menopause such as hot flashes, night sweats, and mood changes. Your sex drive may also be affected. Tell a health care provider about:  Any allergies you have.  All medicines you are taking, including vitamins, herbs, eye drops, creams, and over-the-counter medicines.  Any problems you or family members have had with anesthetic medicines.  Any blood disorders you have.  Any surgeries you have had.  Any medical conditions you have.  Whether you are pregnant or may be pregnant. What are the  risks? Generally, this is a safe procedure. However, problems may occur, including:  Infection.  Bleeding.  Allergic reactions to medicines.  Damage to other structures or organs.  Blood clots in the legs or lungs.  What happens before the procedure? Staying hydrated Follow instructions from your health care provider about hydration, which may include:  Up to 2 hours before the procedure - you may continue to drink clear liquids, such as water, clear fruit juice, black coffee, and plain tea.  Eating and drinking restrictions Follow instructions from your health care provider about eating and drinking, which may include:  8 hours before the procedure - stop eating heavy meals or foods such as meat, fried foods, or fatty foods.  6 hours before the procedure - stop eating light meals or foods, such as toast or cereal.  6 hours before the procedure - stop drinking milk or drinks that contain milk.  2 hours before the procedure - stop drinking clear liquids.  Medicines  Ask your health care provider about: ? Changing or stopping your regular medicines. This is especially important if you are taking diabetes medicines or blood thinners. ? Taking medicines such as aspirin and ibuprofen. These medicines can thin your blood. Do not take these medicines before your procedure if your health care provider instructs you not to.  You may be given antibiotic medicine to help prevent infection. General instructions  Do not smoke for at least 2 weeks before your procedure or as told by your health care provider.  You may have an exam or testing.  You may have a blood or urine sample taken.  Ask  your health care provider how your surgical site will be marked or identified.  Plan to have someone take you home from the hospital.  If you will be going home right after the procedure, plan to have someone with you for 24 hours. What happens during the procedure?  To reduce your risk of  infection: ? Your health care team will wash or sanitize their hands. ? Your skin will be washed with soap. ? Hair may be removed from the surgical area.  An IV tube will be inserted into one of your veins.  You will be given one or more of the following: ? A medicine to help you relax (sedative). ? A medicine to make you fall asleep (general anesthetic).  A thin tube (catheter) will be inserted through your urethra and into your bladder. The catheter drains urine during your procedure.  Depending on the type of surgery you are having, your surgeon will do one of the following: ? Make one incision in your abdomen (open surgery). ? Make two small incisions in your abdomen (laparoscopic surgery). The laparoscope will be passed through one incision, and surgical instruments will be passed through the other. ? Make several small incisions in your abdomen (robot-assisted surgery). A laparoscope and other surgical instruments may be passed through the incisions.  Your fallopian tubes and ovaries will be cut away from the uterus and removed.  Your blood vessels will be clamped and tied to prevent too much bleeding.  The incision(s) in your abdomen will be closed with stitches (sutures) or staples.  A bandage (dressing) may be placed over your incision(s). The procedure may vary among health care providers and hospitals. What happens after the procedure?  Your blood pressure, heart rate, breathing rate, and blood oxygen level will be monitored until the medicines you were given have worn off.  You may continue to receive fluids and medicines through an IV tube.  You may continue to have a catheter draining your urine.  You may have to wear compression stockings. These stockings help to prevent blood clots and reduce swelling in your legs.  You will be given pain medicine as needed.  Do not drive for 24 hours if you received a sedative. Summary  Bilateral salpingo-oophorectomy is a  procedure to remove both fallopian tubes and both ovaries.  There are three different techniques that can be used for this procedure, including open, laparoscopic, and robotic. Talk with your health care provider about how your procedure will be done.  As a result of this procedure, you will become sterile and you will go into menopause.  Plan to have someone take you home from the hospital. This information is not intended to replace advice given to you by your health care provider. Make sure you discuss any questions you have with your health care provider. Document Released: 11/28/2005 Document Revised: 01/02/2017 Document Reviewed: 01/02/2017 Elsevier Interactive Patient Education  2018 [employer].   Pre-operative Alison [name] will receive a phone call from presurgical testing at [facility] to arrange for a pre-operative testing appointment before your surgery.  This appointment normally occurs one to two weeks before your scheduled surgery.   -Bring your insurance card, copy of an advanced directive if applicable, medication list  -At that visit, you will be asked to sign a consent for a possible blood transfusion in case a transfusion becomes necessary during surgery.  The need for a blood transfusion is rare but having consent is a necessary part of  your care.     -You should not be taking blood thinners or aspirin at least ten days prior to surgery unless instructed by your surgeon.  Day Before Surgery [place] will be asked to take in a light diet the day before surgery.  Avoid carbonated beverages.  You will be advised to have nothing to eat or drink after midnight the evening before.     Eat a light diet the day before surgery.  Examples including soups, broths,  toast, yogurt, mashed potatoes.  Things to avoid include carbonated beverages  (fizzy beverages), raw fruits and raw vegetables, or beans.    If your bowels are filled with gas, your surgeon will have  difficulty  visualizing your pelvic organs which increases your surgical risks.  Your role in recovery Your role is to become active as soon as directed by your doctor, while still giving yourself time to heal.  Rest when you feel tired. You will be asked to do the following in order to speed your recovery:  - Cough and breathe deeply. This helps toclear and expand your lungs and can prevent pneumonia. You may be given a spirometer to practice deep breathing. A staff member will show you how to use the spirometer. - Do mild physical activity. Walking or moving your legs help your circulation and body functions return to normal. A staff member will help you when you try to walk and will provide you with simple exercises. Do not try to get up or walk alone the first time. - Actively manage your pain. Managing your pain lets you move in comfort. We will ask you to rate your pain on a scale of zero to 10. It is your responsibility to tell your doctor or nurse where and how much you hurt so your pain can be treated.  Special Considerations -If you are diabetic, you may be placed on insulin after surgery to have closer control over your blood sugars to promote healing and recovery.  This does not mean that you will be discharged on insulin.  If applicable, your oral antidiabetics will be resumed when you are tolerating a solid diet.  -Your final pathology results from surgery should be available by the Friday after surgery and the results will be relayed to you when available.  Eat a light diet the day before surgery.  Examples including soups, broths, toast, yogurt, mashed potatoes.  Things to avoid include carbonated beverages (fizzy beverages), raw fruits and raw vegetables, or beans.   If your bowels are filled with gas, your surgeon will have difficulty visualizing your pelvic organs which increases your surgical risks. Blood Transfusion Information WHAT IS A BLOOD TRANSFUSION? A transfusion is the  replacement of blood or some of its parts. Blood is made up of multiple cells which provide different functions.  Red blood cells carry oxygen and are used for blood loss replacement.  White blood cells fight against infection.  Platelets control bleeding.  Plasma helps clot blood.  Other blood products are available for specialized needs, such as hemophilia or other clotting disorders. BEFORE THE TRANSFUSION  Who gives blood for transfusions?   You may be able to donate blood to be used at a later date on yourself (autologous donation).  Relatives can be asked to donate blood. This is generally not any safer than if you have received blood from a stranger. The same precautions are taken to ensure safety when a relative's blood is donated.  Healthy volunteers who  are fully evaluated to make sure their blood is safe. This is blood bank blood. Transfusion therapy is the safest it has ever been in the practice of medicine. Before blood is taken from a donor, a complete history is taken to make sure that person has no history of diseases nor engages in risky social behavior (examples are intravenous drug use or sexual activity with multiple partners). The donor's travel history is screened to minimize risk of transmitting infections, such as malaria. The donated blood is tested for signs of infectious diseases, such as HIV and hepatitis. The blood is then tested to be sure it is compatible with you in order to minimize the chance of a transfusion reaction. If you or a relative donates blood, this is often done in anticipation of surgery and is not appropriate for emergency situations. It takes many days to process the donated blood. RISKS AND COMPLICATIONS Although transfusion therapy is very safe and saves many lives, the main dangers of transfusion include:   Getting an infectious disease.  Developing a transfusion reaction. This is an allergic reaction to something in the blood you were  given. Every precaution is taken to prevent this. The decision to have a blood transfusion has been considered carefully by your caregiver before blood is given. Blood is not given unless the benefits outweigh the risks.

## 2017-05-18 NOTE — Progress Notes (Signed)
PRE OP NOTE  Referring Clinician:  Dr. Bard Herbert 56 y.o. female  CC:  Chief Complaint  Patient presents with  . BRCA 2 Gene Mutation    Assessment/Plan:  Ms. Lesleyann Fichter is a 56 y.o. 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 Additionally, 1 VUS, called "c.16A>C (p.Thr6Pro)" was found in one copy of the MSH6 gene.  Adnexa additionally are normal on UTZ and CA 125  Is wnl.  There is a remote history of hysterectomy.  A surgery date of June 01, 2017 with me is tentatively in place with the plan for robotic-assisted bilateral salpingo-oophorectomy with washings.  The risks of this procedure been discussed with Ms. Wheeland and her mother Mrs. Brown and inclusive of infection bleeding damage to surrounding structures and prolonged hospitalization. They are aware of a 4 possibility of reoperation for surgical staging if microscopic evaluation determines the presence of a malignancy.  If there is intraoperative diagnosis of  malignancy staging and debulking will be performed which may include laparotomy and bowel procedures.     HPI: Ms. Emalynn Clewis is a 56 y.o.   5 para 5 with a diagnosis of a right upper outer quadrant breast mass in December 2016. She subsequently has undergone lumpectomy and adjuvant chemotherapy Adriamycin and Cytoxan followed by radiation therapy which was completed in September 2017. 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 Additionally, 1 VUS, called "c.16A>C (p.Thr6Pro)" was found in one copy of the MSH6 gene .  [name]  Has a diagnosis of schizophrenia and defers to her mother for her medical history. They  report NSVD 4, one cesarean section and a bilateral tubal ligation. Family history is notable for mother with breast cancer diagnosed at the age of 78 a maternal aunt with ovarian cancer diagnosed in his 59s a  maternal cousin diagnosed with breast cancer at an unspecified age. There are 2 maternal uncles with prostate cancer diagnosed at an unspecified age and a brother with vocal cord cancer.   Ms. Bianca is unclear about when she became menopausal.  Both she and her mother appear unaware of a prior hysterectomy.  Review of the medical record is notable for a note in 2015 within the past surgical history documentation of a laparoscopic vaginal hysterectomy. An MRI of the hip in 2007 notes that the uterus is not visualized.  Pelvic ultrasound 03/21/2017 confirms the absence of a uterus.  Left ovary 2.2x1.0x1.8 with a 53m calcification along the edge R ovary 3.0x1.3x2.2cm .  No free fluid.  CA 125 03/2017 9.9   Review of Systems:  Constitutional  Feels well,  Cardiovascular  No chest pain, shortness of breath, or edema  Pulmonary  No cough or wheeze.  Gastro Intestinal  No nausea, vomitting, or diarrhoea. No bright red blood per rectum, no abdominal pain, change in bowel movement, or constipation. No abdominal bloating excellent appetite Genito Urinary  No frequency, urgency, dysuria, no vaginal bleeding Musculo Skeletal  No myalgia, arthralgia, joint swelling or pain  Neurologic  No weakness, numbness, change in gait,  Psychology  No depression, anxiety, insomnia.    Current Meds:  Outpatient Encounter Prescriptions as of 05/18/2017  Medication Sig  . acetaminophen (TYLENOL) 325 MG tablet Take 650 mg by mouth every 6 (six) hours as needed for mild pain.  . benztropine (COGENTIN) 0.5 MG tablet Take 0.5 mg by mouth 2 (two) times daily.  . carboxymethylcellulose (REFRESH CELLUVISC) 1 %  ophthalmic solution Place 1 drop into both eyes 4 (four) times daily.   . carvedilol (COREG) 3.125 MG tablet TAKE 1 TABLET BY MOUTH TWICE DAILY.  . divalproex (DEPAKOTE ER) 500 MG 24 hr tablet Take 1,000 mg by mouth at bedtime.  . furosemide (LASIX) 40 MG tablet Take 40 mg by mouth every morning.  Marland Kitchen Lifitegrast  (XIIDRA) 5 % SOLN Place 1 drop into both eyes 2 (two) times daily.  Marland Kitchen lisinopril (PRINIVIL,ZESTRIL) 10 MG tablet Take 10 mg by mouth every morning.   . metFORMIN (GLUCOPHAGE) 500 MG tablet Take by mouth 2 (two) times daily with a meal.  . OLANZapine (ZYPREXA) 10 MG tablet Take 10 mg by mouth at bedtime.  . Olopatadine HCl (PAZEO) 0.7 % SOLN Apply 1 drop to eye every evening. 1 drop in each eye every evening.  Marland Kitchen omeprazole (PRILOSEC) 20 MG capsule Take 40 mg by mouth every morning.  . simvastatin (ZOCOR) 10 MG tablet Take 10 mg by mouth at bedtime.   Facility-Administered Encounter Medications as of 05/18/2017  Medication  . 0.9 %  sodium chloride infusion  . sodium chloride flush (NS) 0.9 % injection 10 mL    Allergy: No Known Allergies  Social Hx:   Social History   Social History  . Marital status: Single    Spouse name: N/A  . Number of children: N/A  . Years of education: N/A   Occupational History  . Not on file.   Social History Main Topics  . Smoking status: Former Smoker    Packs/day: 0.25    Types: Cigarettes    Quit date: 12/2015  . Smokeless tobacco: Never Used  . Alcohol use Yes     Comment: "only on special occasions"   . Drug use: No  . Sexual activity: Yes    Birth control/ protection: Surgical   Other Topics Concern  . Not on file   Social History Narrative  . No narrative on file    Past Surgical Hx:  Past Surgical History:  Procedure Laterality Date  . ABDOMINAL HYSTERECTOMY    . BREAST LUMPECTOMY Right 2017  . BREAST LUMPECTOMY WITH RADIOACTIVE SEED AND SENTINEL LYMPH NODE BIOPSY Right 01/20/2016   Procedure: RIGHT BREAST LUMPECTOMY WITH RADIOACTIVE SEED AND SENTINEL LYMPH NODE BIOPSY;  Surgeon: Coralie Keens, MD;  Location: [place];  Service: General;  Laterality: Right;  . CHOLECYSTECTOMY N/A 04/16/2014   Procedure: LAPAROSCOPIC CHOLECYSTECTOMY ;  Surgeon: [name]. Georgette Dover, MD;  Location: [facility];  Service: General;   Laterality: N/A;  . PORT-A-CATH REMOVAL Left 10/13/2016   Procedure: REMOVAL PORT-A-CATH;  Surgeon: Coralie Keens, MD;  Location: [facility];  Service: General;  Laterality: Left;  . PORTACATH PLACEMENT Left 01/20/2016   Procedure: INSERTION PORT-A-CATH;  Surgeon: Coralie Keens, MD;  Location: [place];  Service: General;  Laterality: Left;    Past Medical Hx:  Past Medical History:  Diagnosis Date  . Anemia   . Cancer ([place])   . Depression    chronic   . Diabetes mellitus without complication ([place])    type II  . Gallstones   . GERD (gastroesophageal reflux disease)   . History of radiation therapy 07/25/16- 09/05/16   Right Breast  . Hyperlipidemia   . Hypertension   . Schizophrenia ([place])   Bilateral cataracts   Past Gynecological History: Gravida 5 para 5 unknown dates of menarche or menopause. Reports prior bilateral tubal ligation, reports a normal Pap test last year and  denies any history of abnormal Pap tests  No LMP recorded. Patient has had a hysterectomy. Per the medical record  Family Hx:  Family History  Problem Relation Age of Onset  . Breast cancer Mother 79       s/p partial mastectomy  . Skin cancer Mother        on right knee; unspecified type  . Colon polyps Mother        three total  . Other Mother 53       history of a TAH-BSO for bleeding  . Prostate cancer Maternal Uncle        (x2 maternal uncles)  . Cirrhosis Maternal Grandfather        +EtOH  . Cancer Brother 55       vocal cord ca; not a smoker  . Autism Brother   . Cancer Maternal Uncle        vocal cord ca; +smoker  . Ovarian cancer Maternal Aunt        dx. >50  . Cancer Maternal Aunt        unspecified type  . Diabetes Maternal Aunt   . Colon cancer Cousin        (x2 maternal first cousins--sisters)  . Breast cancer Cousin        dx. 62 or younger  . Cancer Other        NOS cancer; lim info; all three of his daughters had cancer as well  . Breast cancer Other      Vitals:  Blood pressure 130/87, pulse 88, temperature 98.5 F (36.9 C), temperature source Oral, resp. rate 20, weight 173 lb 8 oz (78.7 kg).  Physical Exam: WD in NAD Neck  Supple NROM, without any enlargements.  Lymph Node Survey No cervical supraclavicular or inguinal adenopathy Cardiovascular  Pulse normal rate, regularity and rhythm.  Lungs  Clear to auscultation bilaterally, Good air movement.  Skin  No rash/lesions/breakdown  Psychiatry  Alert and oriented appropriate mood affect speech and reasoning. Abdomen  Normoactive bowel sounds, abdomen soft, non-tender. Midline vertical incision intact without evidence of hernia.  Back No CVA tenderness Pelvic deferred.  Extremities  No bilateral cyanosis, clubbing or edema.   Janie Morning, MD, PhD 05/18/2017, 10:48 AM

## 2017-05-23 NOTE — Patient Instructions (Addendum)
Alison [name]  05/23/2017   Your procedure is scheduled on: 06-01-17   Report to [facility] Main  Entrance Take [place]  Elevators to 3rd floor to  [place] at 5:30 AM.    Call this number if you have problems the morning of surgery [phone]    Remember: ONLY 1 PERSON MAY GO WITH YOU TO SHORT STAY TO GET  READY MORNING OF [place].  Do not eat food or drink liquids :After Midnight.    Eat a light diet the day before surgery.  Examples including soups, broths, toast, yogurt, mashed potatoes.  Things to avoid include carbonated beverages (fizzy  beverages), raw fruits and raw vegetables, or beans.    If your bowels are filled with gas, your surgeon will have difficulty visualizing your pelvic organs which increases your surgical risks.   Take these medicines the morning of surgery with A SIP OF WATER: Carvedilol (Coreg), Omeprazole (Prilosec) and Benzatropine.   You may also bring and use your eyedrops as needed.    DO NOT TAKE ANY DIABETIC MEDICATIONS DAY OF YOUR SURGERY                               You may not have any metal on your body including hair pins and              piercings  Do not wear jewelry, make-up, lotions, powders or perfumes, deodorant             Do not wear nail polish.  Do not shave  48 hours prior to surgery.                 Do not bring valuables to the hospital. [place].  Contacts, dentures or bridgework may not be worn into surgery.     Patients discharged the day of surgery will not be allowed to drive home.  Name and phone number of your driver: Altha Harm [phone]               Please read over the following fact sheets you were given: _____________________________________________________________________             How to Manage Your Diabetes Before and After Surgery  Why is it important to control my blood sugar before and after surgery? . Improving  blood sugar levels before and after surgery helps healing and can limit problems. . A way of improving blood sugar control is eating a healthy diet by: o  Eating less sugar and carbohydrates o  Increasing activity/exercise o  Talking with your doctor about reaching your blood sugar goals . High blood sugars (greater than 180 mg/dL) can raise your risk of infections and slow your recovery, so you will need to focus on controlling your diabetes during the weeks before surgery. . Make sure that the doctor who takes care of your diabetes knows about your planned surgery including the date and location.  How do I manage my blood sugar before surgery? . Check your blood sugar at least 4 times a day, starting 2 days before surgery, to make sure that the level is not too high or low. o Check your blood sugar the morning of your surgery when you wake  up and every 2 hours until you get to the Short Stay unit. . If your blood sugar is less than 70 mg/dL, you will need to treat for low blood sugar: o Do not take insulin. o Treat a low blood sugar (less than 70 mg/dL) with  cup of clear juice (cranberry or apple), 4 glucose tablets, OR glucose gel. o Recheck blood sugar in 15 minutes after treatment (to make sure it is greater than 70 mg/dL). If your blood sugar is not greater than 70 mg/dL on recheck, call [phone] for further instructions. . Report your blood sugar to the short stay nurse when you get to Short Stay.  . If you are admitted to the hospital after surgery: o Your blood sugar will be checked by the staff and you will probably be given insulin after surgery (instead of oral diabetes medicines) to make sure you have good blood sugar levels. o The goal for blood sugar control after surgery is 80-180 mg/dL.   WHAT DO I DO ABOUT MY DIABETES MEDICATION?  Marland Kitchen Do not take oral diabetes medicines (pills) the morning of surgery.  . THE DAY BEFORE SURGERY, take your usual dose of  Metformin      . The day of surgery, do not take other diabetes injectables, including Byetta (exenatide), Bydureon (exenatide ER), Victoza (liraglutide), or Trulicity (dulaglutide).  IPatient Signature:  Date:   Nurse Signature:  Date:   Reviewed and Endorsed by [facility] Patient Education Committee, August 2015  Incentive Spirometer  An incentive spirometer is a tool that can help keep your lungs clear and active. This tool measures how well you are filling your lungs with each breath. Taking long deep breaths may help reverse or decrease the chance of developing breathing (pulmonary) problems (especially infection) following:  A long period of time when you are unable to move or be active. BEFORE THE PROCEDURE   If the spirometer includes an indicator to show your best effort, your nurse or respiratory therapist will set it to a desired goal.  If possible, sit up straight or lean slightly forward. Try not to slouch.  Hold the incentive spirometer in an upright position. INSTRUCTIONS FOR USE  1. Sit on the edge of your bed if possible, or sit up as far as you can in bed or on a chair. 2. Hold the incentive spirometer in an upright position. 3. Breathe out normally. 4. Place the mouthpiece in your mouth and seal your lips tightly around it. 5. Breathe in slowly and as deeply as possible, raising the piston or the ball toward the top of the column. 6. Hold your breath for 3-5 seconds or for as long as possible. Allow the piston or ball to fall to the bottom of the column. 7. Remove the mouthpiece from your mouth and breathe out normally. 8. Rest for a few seconds and repeat Steps 1 through 7 at least 10 times every 1-2 hours when you are awake. Take your time and take a few normal breaths between deep breaths. 9. The spirometer may include an indicator to show your best effort. Use the indicator as a goal to work toward during each repetition. 10. After each set of 10 deep breaths,  practice coughing to be sure your lungs are clear. If you have an incision (the cut made at the time of surgery), support your incision when coughing by placing a pillow or rolled up towels firmly against it. Once you are able to get out of  bed, walk around indoors and cough well. You may stop using the incentive spirometer when instructed by your caregiver.  RISKS AND COMPLICATIONS  Take your time so you do not get dizzy or light-headed.  If you are in pain, you may need to take or ask for pain medication before doing incentive spirometry. It is harder to take a deep breath if you are having pain. AFTER USE  Rest and breathe slowly and easily.  It can be helpful to keep track of a log of your progress. Your caregiver can provide you with a simple table to help with this. If you are using the spirometer at home, follow these instructions: [place] IF:   You are having difficultly using the spirometer.  You have trouble using the spirometer as often as instructed.  Your pain medication is not giving enough relief while using the spirometer.  You develop fever of 100.5 F (38.1 C) or higher. SEEK IMMEDIATE MEDICAL CARE IF:   You cough up bloody sputum that had not been present before.  You develop fever of 102 F (38.9 C) or greater.  You develop worsening pain at or near the incision site. MAKE SURE YOU:   Understand these instructions.  Will watch your condition.  Will get help right away if you are not doing well or get worse. Document Released: 04/10/2007 Document Revised: 02/20/2012 Document Reviewed: 06/11/2007 Exit[employer] Patient Information 2014 [place].   ________________________________________________________________________ [facility] - Preparing for Surgery Before surgery, you can play an important role.  Because skin is not sterile, your skin needs to be as free of germs as possible.  You can reduce the number of germs on your skin by washing  with CHG (chlorahexidine gluconate) soap before surgery.  CHG is an antiseptic cleaner which kills germs and bonds with the skin to continue killing germs even after washing. Please DO NOT use if you have an allergy to CHG or antibacterial soaps.  If your skin becomes reddened/irritated stop using the CHG and inform your nurse when you arrive at Short Stay. Do not shave (including legs and underarms) for at least 48 hours prior to the first CHG shower.  You may shave your face/neck. Please follow these instructions carefully:  1.  Shower with CHG Soap the night before surgery and the  morning of Surgery.  2.  If you choose to wash your hair, wash your hair first as usual with your  normal  shampoo.  3.  After you shampoo, rinse your hair and body thoroughly to remove the  shampoo.                           4.  Use CHG as you would any other liquid soap.  You can apply chg directly  to the skin and wash                       Gently with a scrungie or clean washcloth.  5.  Apply the CHG Soap to your body ONLY FROM THE NECK DOWN.   Do not use on face/ open                           Wound or open sores. Avoid contact with eyes, ears mouth and genitals (private parts).  Wash face,  Genitals (private parts) with your normal soap.             6.  Wash thoroughly, paying special attention to the area where your surgery  will be performed.  7.  Thoroughly rinse your body with warm water from the neck down.  8.  DO NOT shower/wash with your normal soap after using and rinsing off  the CHG Soap.                9.  Pat yourself dry with a clean towel.            10.  Wear clean pajamas.            11.  Place clean sheets on your bed the night of your first shower and do not  sleep with pets. Day of Surgery : Do not apply any lotions/deodorants the morning of surgery.  Please wear clean clothes to the hospital/surgery center.  FAILURE TO FOLLOW THESE INSTRUCTIONS MAY RESULT IN THE  CANCELLATION OF YOUR SURGERY PATIENT SIGNATURE_________________________________  NURSE SIGNATURE__________________________________  ________________________________________________________________________  WHAT IS A BLOOD TRANSFUSION? Blood Transfusion Information  A transfusion is the replacement of blood or some of its parts. Blood is made up of multiple cells which provide different functions.  Red blood cells carry oxygen and are used for blood loss replacement.  White blood cells fight against infection.  Platelets control bleeding.  Plasma helps clot blood.  Other blood products are available for specialized needs, such as hemophilia or other clotting disorders. BEFORE THE TRANSFUSION  Who gives blood for transfusions?   Healthy volunteers who are fully evaluated to make sure their blood is safe. This is blood bank blood. Transfusion therapy is the safest it has ever been in the practice of medicine. Before blood is taken from a donor, a complete history is taken to make sure that person has no history of diseases nor engages in risky social behavior (examples are intravenous drug use or sexual activity with multiple partners). The donor's travel history is screened to minimize risk of transmitting infections, such as malaria. The donated blood is tested for signs of infectious diseases, such as HIV and hepatitis. The blood is then tested to be sure it is compatible with you in order to minimize the chance of a transfusion reaction. If you or a relative donates blood, this is often done in anticipation of surgery and is not appropriate for emergency situations. It takes many days to process the donated blood. RISKS AND COMPLICATIONS Although transfusion therapy is very safe and saves many lives, the main dangers of transfusion include:   Getting an infectious disease.  Developing a transfusion reaction. This is an allergic reaction to something in the blood you were given. Every  precaution is taken to prevent this. The decision to have a blood transfusion has been considered carefully by your caregiver before blood is given. Blood is not given unless the benefits outweigh the risks. AFTER THE TRANSFUSION  Right after receiving a blood transfusion, you will usually feel much better and more energetic. This is especially true if your red blood cells have gotten low (anemic). The transfusion raises the level of the red blood cells which carry oxygen, and this usually causes an energy increase.  The nurse administering the transfusion will monitor you carefully for complications. HOME CARE INSTRUCTIONS  No special instructions are needed after a transfusion. You may find your energy is better. Speak with your caregiver about any  limitations on activity for underlying diseases you may have. SEEK MEDICAL CARE IF:   Your condition is not improving after your transfusion.  You develop redness or irritation at the intravenous (IV) site. SEEK IMMEDIATE MEDICAL CARE IF:  Any of the following symptoms occur over the next 12 hours:  Shaking chills.  You have a temperature by mouth above 102 F (38.9 C), not controlled by medicine.  Chest, back, or muscle pain.  People around you feel you are not acting correctly or are confused.  Shortness of breath or difficulty breathing.  Dizziness and fainting.  You get a rash or develop hives.  You have a decrease in urine output.  Your urine turns a dark color or changes to pink, red, or brown. Any of the following symptoms occur over the next 10 days:  You have a temperature by mouth above 102 F (38.9 C), not controlled by medicine.  Shortness of breath.  Weakness after normal activity.  The white part of the eye turns yellow (jaundice).  You have a decrease in the amount of urine or are urinating less often.  Your urine turns a dark color or changes to pink, red, or brown. Document Released: 11/25/2000 Document  Revised: 02/20/2012 Document Reviewed: 07/14/2008 [facility] Patient Information 2014 [place], [place].  _______________________________________________________________________

## 2017-05-25 ENCOUNTER — Encounter: Payer: Self-pay | Admitting: Hematology and Oncology

## 2017-05-25 ENCOUNTER — Encounter: Payer: Self-pay | Admitting: *Deleted

## 2017-05-25 ENCOUNTER — Encounter (HOSPITAL_COMMUNITY): Payer: Self-pay

## 2017-05-25 ENCOUNTER — Other Ambulatory Visit: Payer: Medicaid Other

## 2017-05-25 ENCOUNTER — Encounter (HOSPITAL_COMMUNITY)
Admission: RE | Admit: 2017-05-25 | Discharge: 2017-05-25 | Disposition: A | Discharge status: Home / Self Care | Payer: Medicaid Other | Source: Ambulatory Visit | Attending: Obstetrics & Gynecology | Admitting: Obstetrics & Gynecology

## 2017-05-25 DIAGNOSIS — Z1501 Genetic susceptibility to malignant neoplasm of breast: Secondary | ICD-10-CM | POA: Diagnosis not present

## 2017-05-25 DIAGNOSIS — Z01812 Encounter for preprocedural laboratory examination: Secondary | ICD-10-CM | POA: Diagnosis not present

## 2017-05-25 DIAGNOSIS — Z803 Family history of malignant neoplasm of breast: Secondary | ICD-10-CM | POA: Insufficient documentation

## 2017-05-25 DIAGNOSIS — E782 Mixed hyperlipidemia: Secondary | ICD-10-CM | POA: Insufficient documentation

## 2017-05-25 DIAGNOSIS — I10 Essential (primary) hypertension: Secondary | ICD-10-CM | POA: Insufficient documentation

## 2017-05-25 DIAGNOSIS — Z1502 Genetic susceptibility to malignant neoplasm of ovary: Secondary | ICD-10-CM | POA: Diagnosis not present

## 2017-05-25 DIAGNOSIS — Z8041 Family history of malignant neoplasm of ovary: Secondary | ICD-10-CM | POA: Insufficient documentation

## 2017-05-25 DIAGNOSIS — E119 Type 2 diabetes mellitus without complications: Secondary | ICD-10-CM | POA: Diagnosis not present

## 2017-05-25 LAB — CBC WITH DIFFERENTIAL/PLATELET
Basophils Absolute: 0 10*3/uL (ref 0.0–0.1)
Basophils Relative: 0 %
Eosinophils Absolute: 0.1 10*3/uL (ref 0.0–0.7)
Eosinophils Relative: 2 %
HCT: 33.3 % — ABNORMAL LOW (ref 36.0–46.0)
Hemoglobin: 11.2 g/dL — ABNORMAL LOW (ref 12.0–15.0)
Lymphocytes Relative: 35 %
Lymphs Abs: 1.4 10*3/uL (ref 0.7–4.0)
MCH: 28.8 pg (ref 26.0–34.0)
MCHC: 33.6 g/dL (ref 30.0–36.0)
MCV: 85.6 fL (ref 78.0–100.0)
Monocytes Absolute: 0.3 10*3/uL (ref 0.1–1.0)
Monocytes Relative: 7 %
Neutro Abs: 2.2 10*3/uL (ref 1.7–7.7)
Neutrophils Relative %: 56 %
Platelets: 205 10*3/uL (ref 150–400)
RBC: 3.89 MIL/uL (ref 3.87–5.11)
RDW: 13.4 % (ref 11.5–15.5)
WBC: 4 10*3/uL (ref 4.0–10.5)

## 2017-05-25 LAB — COMPREHENSIVE METABOLIC PANEL
ALT: 33 U/L (ref 14–54)
AST: 28 U/L (ref 15–41)
Albumin: 3.8 g/dL (ref 3.5–5.0)
Alkaline Phosphatase: 70 U/L (ref 38–126)
Anion gap: 10 (ref 5–15)
BUN: 12 mg/dL (ref 6–20)
CO2: 29 mmol/L (ref 22–32)
Calcium: 9 mg/dL (ref 8.9–10.3)
Chloride: 104 mmol/L (ref 101–111)
Creatinine, Ser: 0.8 mg/dL (ref 0.44–1.00)
GFR calc Af Amer: >60 mL/min (ref >60)
GFR calc non Af Amer: >60 mL/min (ref >60)
Glucose, Bld: 148 mg/dL — ABNORMAL HIGH (ref 65–99)
Potassium: 3.8 mmol/L (ref 3.5–5.1)
Sodium: 143 mmol/L (ref 135–145)
Total Bilirubin: 0.3 mg/dL (ref 0.3–1.2)
Total Protein: 7 g/dL (ref 6.5–8.1)

## 2017-05-25 LAB — GLUCOSE, CAPILLARY: Glucose-Capillary: 135 mg/dL — ABNORMAL HIGH (ref 65–99)

## 2017-05-25 NOTE — Progress Notes (Signed)
[place] Work  Clinical Social Work was referred by patient and her mother for assistance with appointments and assisted living placement resources.  Clinical Social Worker met with patient and her mother at [facility] to offer support and assess for needs.  CSW introduced self, explained role of CSW/Pt and Family Support Team, support groups and other resources to assist.    Pt resides in a group home as she has schizoaffective disorder. Mother voiced concerns about this living arrangement and requested other options. Daughter/pt appears content at current group home and appears to be able to make her own decisions. Per mother and pt, she does not have a guardian and manages her medical care. CSW provided education on how group homes are run and charge for services. Mother appeared to not understand this process and CSW gave ALF/group home list for them to explore further if they wish. CSW educated pt and her mother that she would need to explore alternative group home placement through her PCP or psychiatric provider. CSW phoned RN about cancelled ultrasound appointment and she will explore this issue. Pt and mother aware of pre-admit appt later today and plan attend. No other needs noted. Pt and mother plan to reach out as needed.     Clinical Social Work interventions: Resource education and referral  Loren Racer, LCSW, OSW-C Clinical Social Worker [place]  [place] Phone: [phone] Fax: [phone]

## 2017-05-25 NOTE — Progress Notes (Signed)
Talked with Ms. Schank regarding assistance through [employer] and/ or [employer]. I explained we will need proof of income to see if she quality for the grant. Ms. Kennington gave me permission to call the group home to speak with Gara Kroner. I called Ms. Tamala Julian at [phone] and she agreed to fax income information to [phone].  Ms. Tamala Julian also provided Mardene Celeste (group home [facility]) contact information [phone]. I spoke with Lenise regarding this situation and asked her to be on the lookout for the income information.

## 2017-05-25 NOTE — Progress Notes (Signed)
06-27-16 (EPIC) EKG, ECHO

## 2017-05-26 ENCOUNTER — Encounter: Payer: Self-pay | Admitting: Hematology and Oncology

## 2017-05-26 LAB — HEMOGLOBIN A1C
Hgb A1c MFr Bld: 6.4 % — ABNORMAL HIGH (ref 4.8–5.6)
Mean Plasma Glucose: 137 mg/dL

## 2017-05-26 LAB — ABO/RH: ABO/RH(D): O POS

## 2017-05-26 NOTE — Progress Notes (Signed)
I have received pt's income info but she does not qualify for the [employer] or [employer] because she is no longer in active treatment.

## 2017-05-31 NOTE — Anesthesia Preprocedure Evaluation (Addendum)
Anesthesia Evaluation  Patient identified by MRN, date of birth, ID band Patient awake    Reviewed: Allergy & Precautions, H&P , NPO status , Patient's Chart, lab work & pertinent test results  History of Anesthesia Complications Negative for: history of anesthetic complications  Airway Mallampati: II  TM Distance: >3 FB Neck ROM: Full    Dental  (+) Poor Dentition, Dental Advisory Given   Pulmonary Current Smoker, former smoker,    Pulmonary exam normal        Cardiovascular hypertension, Pt. on medications and Pt. on home beta blockers Normal cardiovascular exam  Systolic function was normal. The estimated ejection fraction was   50%. Wall motion was normal; there were no regional wall motion   abnormalities.    Neuro/Psych PSYCHIATRIC DISORDERS Depression Schizophrenia Patient lives in a group homenegative neurological ROS     GI/Hepatic Neg liver ROS,   Endo/Other  negative endocrine ROSdiabetes, Type 2, Oral Hypoglycemic Agents  Renal/GU negative Renal ROS  negative genitourinary   Musculoskeletal negative musculoskeletal ROS (+)   Abdominal   Peds negative pediatric ROS (+)  Hematology negative hematology ROS (+)   Anesthesia Other Findings   Reproductive/Obstetrics negative OB ROS                            Anesthesia Physical  Anesthesia Plan  ASA: III  Anesthesia Plan: General   Post-op Pain Management:    Induction: Intravenous  PONV Risk Score and Plan: 3 and Ondansetron, Dexamethasone and Scopolamine patch - Pre-op  Airway Management Planned: Oral ETT  Additional Equipment:   Intra-op Plan:   Post-operative Plan: Extubation in OR  Informed Consent: I have reviewed the patients History and Physical, chart, labs and discussed the procedure including the risks, benefits and alternatives for the proposed anesthesia with the patient or authorized representative who  has indicated his/her understanding and acceptance.   Dental advisory given  Plan Discussed with: CRNA, Anesthesiologist and Surgeon  Anesthesia Plan Comments:        Anesthesia Quick Evaluation

## 2017-06-01 ENCOUNTER — Ambulatory Visit (HOSPITAL_COMMUNITY): Payer: Medicaid Other | Admitting: Anesthesiology

## 2017-06-01 ENCOUNTER — Ambulatory Visit (HOSPITAL_COMMUNITY)
Admission: RE | Admit: 2017-06-01 | Discharge: 2017-06-02 | Disposition: A | Discharge status: Home / Self Care | Payer: Medicaid Other | Source: Ambulatory Visit | Attending: Obstetrics & Gynecology | Admitting: Obstetrics & Gynecology

## 2017-06-01 ENCOUNTER — Encounter (HOSPITAL_COMMUNITY)
Admission: RE | Disposition: A | Discharge status: Home / Self Care | Payer: Self-pay | Source: Ambulatory Visit | Attending: Obstetrics & Gynecology

## 2017-06-01 ENCOUNTER — Telehealth: Payer: Self-pay | Admitting: *Deleted

## 2017-06-01 ENCOUNTER — Encounter (HOSPITAL_COMMUNITY): Payer: Self-pay | Admitting: *Deleted

## 2017-06-01 DIAGNOSIS — Z8041 Family history of malignant neoplasm of ovary: Secondary | ICD-10-CM | POA: Insufficient documentation

## 2017-06-01 DIAGNOSIS — Z90710 Acquired absence of both cervix and uterus: Secondary | ICD-10-CM | POA: Insufficient documentation

## 2017-06-01 DIAGNOSIS — Z8042 Family history of malignant neoplasm of prostate: Secondary | ICD-10-CM | POA: Insufficient documentation

## 2017-06-01 DIAGNOSIS — Z803 Family history of malignant neoplasm of breast: Secondary | ICD-10-CM | POA: Insufficient documentation

## 2017-06-01 DIAGNOSIS — E785 Hyperlipidemia, unspecified: Secondary | ICD-10-CM | POA: Diagnosis not present

## 2017-06-01 DIAGNOSIS — K219 Gastro-esophageal reflux disease without esophagitis: Secondary | ICD-10-CM | POA: Diagnosis not present

## 2017-06-01 DIAGNOSIS — Z1501 Genetic susceptibility to malignant neoplasm of breast: Secondary | ICD-10-CM | POA: Insufficient documentation

## 2017-06-01 DIAGNOSIS — Z1502 Genetic susceptibility to malignant neoplasm of ovary: Secondary | ICD-10-CM

## 2017-06-01 DIAGNOSIS — F329 Major depressive disorder, single episode, unspecified: Secondary | ICD-10-CM | POA: Insufficient documentation

## 2017-06-01 DIAGNOSIS — H269 Unspecified cataract: Secondary | ICD-10-CM | POA: Diagnosis not present

## 2017-06-01 DIAGNOSIS — Z87891 Personal history of nicotine dependence: Secondary | ICD-10-CM | POA: Insufficient documentation

## 2017-06-01 DIAGNOSIS — Z853 Personal history of malignant neoplasm of breast: Secondary | ICD-10-CM

## 2017-06-01 DIAGNOSIS — Z79899 Other long term (current) drug therapy: Secondary | ICD-10-CM | POA: Insufficient documentation

## 2017-06-01 DIAGNOSIS — I10 Essential (primary) hypertension: Secondary | ICD-10-CM | POA: Diagnosis not present

## 2017-06-01 DIAGNOSIS — Z7984 Long term (current) use of oral hypoglycemic drugs: Secondary | ICD-10-CM | POA: Diagnosis not present

## 2017-06-01 DIAGNOSIS — Z4002 Encounter for prophylactic removal of ovary: Secondary | ICD-10-CM | POA: Diagnosis not present

## 2017-06-01 DIAGNOSIS — Z9221 Personal history of antineoplastic chemotherapy: Secondary | ICD-10-CM | POA: Diagnosis not present

## 2017-06-01 DIAGNOSIS — E119 Type 2 diabetes mellitus without complications: Secondary | ICD-10-CM | POA: Diagnosis not present

## 2017-06-01 DIAGNOSIS — Z923 Personal history of irradiation: Secondary | ICD-10-CM | POA: Diagnosis not present

## 2017-06-01 DIAGNOSIS — Z808 Family history of malignant neoplasm of other organs or systems: Secondary | ICD-10-CM | POA: Insufficient documentation

## 2017-06-01 DIAGNOSIS — Z1509 Genetic susceptibility to other malignant neoplasm: Secondary | ICD-10-CM

## 2017-06-01 DIAGNOSIS — F209 Schizophrenia, unspecified: Secondary | ICD-10-CM | POA: Diagnosis not present

## 2017-06-01 HISTORY — PX: ROBOTIC ASSISTED BILATERAL SALPINGO OOPHERECTOMY: SHX6078

## 2017-06-01 HISTORY — PX: DEBULKING: SHX6277

## 2017-06-01 LAB — TYPE AND SCREEN
ABO/RH(D): O POS
Antibody Screen: NEGATIVE

## 2017-06-01 LAB — GLUCOSE, CAPILLARY
Glucose-Capillary: 105 mg/dL — ABNORMAL HIGH (ref 65–99)
Glucose-Capillary: 125 mg/dL — ABNORMAL HIGH (ref 65–99)
Glucose-Capillary: 129 mg/dL — ABNORMAL HIGH (ref 65–99)
Glucose-Capillary: 190 mg/dL — ABNORMAL HIGH (ref 65–99)
Glucose-Capillary: 203 mg/dL — ABNORMAL HIGH (ref 65–99)

## 2017-06-01 SURGERY — SALPINGO-OOPHORECTOMY, BILATERAL, ROBOT-ASSISTED
Anesthesia: General

## 2017-06-01 MED ORDER — BENZTROPINE MESYLATE 0.5 MG PO TABS
0.5000 mg | ORAL_TABLET | Freq: Two times a day (BID) | ORAL | Status: DC
  Administered 2017-06-01 – 2017-06-02 (×2): 0.5 mg via ORAL
  Filled 2017-06-01 (×2): qty 1

## 2017-06-01 MED ORDER — LISINOPRIL 10 MG PO TABS
10.0000 mg | ORAL_TABLET | Freq: Every morning | ORAL | Status: DC
Start: 2017-06-02 — End: 2017-06-02
  Administered 2017-06-02: 10 mg via ORAL
  Filled 2017-06-01: qty 1

## 2017-06-01 MED ORDER — INSULIN ASPART 100 UNIT/ML ~~LOC~~ SOLN
0.0000 [IU] | Freq: Three times a day (TID) | SUBCUTANEOUS | Status: DC
  Administered 2017-06-01: 1 [IU] via SUBCUTANEOUS
  Administered 2017-06-02 (×2): 2 [IU] via SUBCUTANEOUS

## 2017-06-01 MED ORDER — FENTANYL CITRATE (PF) 250 MCG/5ML IJ SOLN
INTRAMUSCULAR | Status: AC
  Filled 2017-06-01: qty 5

## 2017-06-01 MED ORDER — PROMETHAZINE HCL 25 MG/ML IJ SOLN: 6.2500 mg | INTRAMUSCULAR | Status: DC | PRN

## 2017-06-01 MED ORDER — METFORMIN HCL 500 MG PO TABS
500.0000 mg | ORAL_TABLET | Freq: Two times a day (BID) | ORAL | Status: DC
  Administered 2017-06-01 – 2017-06-02 (×2): 500 mg via ORAL
  Filled 2017-06-01 (×2): qty 1

## 2017-06-01 MED ORDER — EPHEDRINE 5 MG/ML INJ
INTRAVENOUS | Status: AC
  Filled 2017-06-01: qty 10

## 2017-06-01 MED ORDER — MIDAZOLAM HCL 5 MG/5ML IJ SOLN
INTRAMUSCULAR | Status: DC | PRN
  Administered 2017-06-01: 2 mg via INTRAVENOUS

## 2017-06-01 MED ORDER — OLANZAPINE 5 MG PO TABS
10.0000 mg | ORAL_TABLET | Freq: Every day | ORAL | Status: DC
  Administered 2017-06-01: 10 mg via ORAL
  Filled 2017-06-01: qty 2

## 2017-06-01 MED ORDER — PROPOFOL 10 MG/ML IV BOLUS
INTRAVENOUS | Status: AC
  Filled 2017-06-01: qty 20

## 2017-06-01 MED ORDER — EPHEDRINE SULFATE-NACL 50-0.9 MG/10ML-% IV SOSY
PREFILLED_SYRINGE | INTRAVENOUS | Status: DC | PRN
  Administered 2017-06-01: 5 mg via INTRAVENOUS

## 2017-06-01 MED ORDER — SIMVASTATIN 10 MG PO TABS
10.0000 mg | ORAL_TABLET | Freq: Every day | ORAL | Status: DC
  Administered 2017-06-01: 10 mg via ORAL
  Filled 2017-06-01: qty 1

## 2017-06-01 MED ORDER — ROCURONIUM BROMIDE 50 MG/5ML IV SOSY
PREFILLED_SYRINGE | INTRAVENOUS | Status: AC
  Filled 2017-06-01: qty 5

## 2017-06-01 MED ORDER — LIDOCAINE 2% (20 MG/ML) 5 ML SYRINGE
INTRAMUSCULAR | Status: DC | PRN
  Administered 2017-06-01: 100 mg via INTRAVENOUS

## 2017-06-01 MED ORDER — CEFAZOLIN SODIUM-DEXTROSE 2-4 GM/100ML-% IV SOLN
2.0000 g | INTRAVENOUS | Status: AC
  Administered 2017-06-01: 2 g via INTRAVENOUS

## 2017-06-01 MED ORDER — CEFAZOLIN SODIUM-DEXTROSE 2-4 GM/100ML-% IV SOLN
INTRAVENOUS | Status: AC
  Filled 2017-06-01: qty 100

## 2017-06-01 MED ORDER — ONDANSETRON HCL 4 MG/2ML IJ SOLN
INTRAMUSCULAR | Status: DC | PRN
  Administered 2017-06-01: 4 mg via INTRAVENOUS

## 2017-06-01 MED ORDER — ONDANSETRON HCL 4 MG/2ML IJ SOLN: 4.0000 mg | Freq: Four times a day (QID) | INTRAMUSCULAR | Status: DC | PRN

## 2017-06-01 MED ORDER — SUGAMMADEX SODIUM 200 MG/2ML IV SOLN
INTRAVENOUS | Status: AC
  Filled 2017-06-01: qty 2

## 2017-06-01 MED ORDER — LACTATED RINGERS IR SOLN
Status: DC | PRN
  Administered 2017-06-01: 1000 mL

## 2017-06-01 MED ORDER — MIDAZOLAM HCL 2 MG/2ML IJ SOLN
INTRAMUSCULAR | Status: AC
  Filled 2017-06-01: qty 2

## 2017-06-01 MED ORDER — ONDANSETRON HCL 4 MG PO TABS: 4.0000 mg | ORAL_TABLET | Freq: Four times a day (QID) | ORAL | Status: DC | PRN

## 2017-06-01 MED ORDER — HYDROMORPHONE HCL 1 MG/ML IJ SOLN
INTRAMUSCULAR | Status: AC
  Filled 2017-06-01: qty 0.5

## 2017-06-01 MED ORDER — PANTOPRAZOLE SODIUM 40 MG PO TBEC
40.0000 mg | DELAYED_RELEASE_TABLET | Freq: Every day | ORAL | Status: DC
  Administered 2017-06-02: 40 mg via ORAL
  Filled 2017-06-01: qty 1

## 2017-06-01 MED ORDER — OXYCODONE-ACETAMINOPHEN 5-325 MG PO TABS: 1.0000 | ORAL_TABLET | ORAL | 0 refills | Status: DC | PRN

## 2017-06-01 MED ORDER — SCOPOLAMINE 1 MG/3DAYS TD PT72
1.0000 | MEDICATED_PATCH | TRANSDERMAL | Status: DC
  Administered 2017-06-01: 1.5 mg via TRANSDERMAL
  Filled 2017-06-01: qty 1

## 2017-06-01 MED ORDER — ROCURONIUM BROMIDE 10 MG/ML (PF) SYRINGE
PREFILLED_SYRINGE | INTRAVENOUS | Status: DC | PRN
  Administered 2017-06-01: 10 mg via INTRAVENOUS
  Administered 2017-06-01: 40 mg via INTRAVENOUS
  Administered 2017-06-01: 10 mg via INTRAVENOUS

## 2017-06-01 MED ORDER — PROPOFOL 10 MG/ML IV BOLUS
INTRAVENOUS | Status: DC | PRN
  Administered 2017-06-01: 170 mg via INTRAVENOUS

## 2017-06-01 MED ORDER — LACTATED RINGERS IV SOLN
INTRAVENOUS | Status: DC
  Administered 2017-06-01: 13:00:00 via INTRAVENOUS
  Administered 2017-06-01: 1000 mL via INTRAVENOUS

## 2017-06-01 MED ORDER — SUCCINYLCHOLINE CHLORIDE 200 MG/10ML IV SOSY
PREFILLED_SYRINGE | INTRAVENOUS | Status: DC | PRN
  Administered 2017-06-01: 100 mg via INTRAVENOUS

## 2017-06-01 MED ORDER — IBUPROFEN 800 MG PO TABS: 800.0000 mg | ORAL_TABLET | Freq: Three times a day (TID) | ORAL | Status: DC | PRN

## 2017-06-01 MED ORDER — HYDROMORPHONE HCL 1 MG/ML IJ SOLN
0.2500 mg | INTRAMUSCULAR | Status: DC | PRN
  Administered 2017-06-01 (×2): 0.5 mg via INTRAVENOUS

## 2017-06-01 MED ORDER — DEXAMETHASONE SODIUM PHOSPHATE 10 MG/ML IJ SOLN
INTRAMUSCULAR | Status: DC | PRN
  Administered 2017-06-01: 10 mg via INTRAVENOUS

## 2017-06-01 MED ORDER — KCL IN DEXTROSE-NACL 20-5-0.45 MEQ/L-%-% IV SOLN
INTRAVENOUS | Status: DC
  Administered 2017-06-01: 17:00:00 via INTRAVENOUS
  Filled 2017-06-01 (×2): qty 1000

## 2017-06-01 MED ORDER — FENTANYL CITRATE (PF) 100 MCG/2ML IJ SOLN
INTRAMUSCULAR | Status: DC | PRN
Start: 2017-06-01 — End: 2017-06-01
  Administered 2017-06-01 (×5): 50 ug via INTRAVENOUS

## 2017-06-01 MED ORDER — ONDANSETRON HCL 4 MG/2ML IJ SOLN
INTRAMUSCULAR | Status: AC
  Filled 2017-06-01: qty 2

## 2017-06-01 MED ORDER — STERILE WATER FOR IRRIGATION IR SOLN
Status: DC | PRN
  Administered 2017-06-01: 1000 mL

## 2017-06-01 MED ORDER — LIDOCAINE 2% (20 MG/ML) 5 ML SYRINGE
INTRAMUSCULAR | Status: AC
  Filled 2017-06-01: qty 5

## 2017-06-01 MED ORDER — DIVALPROEX SODIUM ER 500 MG PO TB24
1000.0000 mg | ORAL_TABLET | Freq: Every day | ORAL | Status: DC
  Administered 2017-06-01: 1000 mg via ORAL
  Filled 2017-06-01: qty 2

## 2017-06-01 MED ORDER — HYDROMORPHONE HCL 1 MG/ML IJ SOLN
0.2000 mg | INTRAMUSCULAR | Status: DC | PRN
  Administered 2017-06-01: 0.2 mg via INTRAVENOUS
  Filled 2017-06-01: qty 1

## 2017-06-01 MED ORDER — CARVEDILOL 3.125 MG PO TABS
3.1250 mg | ORAL_TABLET | Freq: Two times a day (BID) | ORAL | Status: DC
  Administered 2017-06-01 – 2017-06-02 (×2): 3.125 mg via ORAL
  Filled 2017-06-01 (×2): qty 1

## 2017-06-01 MED ORDER — SUCCINYLCHOLINE CHLORIDE 200 MG/10ML IV SOSY
PREFILLED_SYRINGE | INTRAVENOUS | Status: AC
  Filled 2017-06-01: qty 10

## 2017-06-01 MED ORDER — OXYCODONE-ACETAMINOPHEN 5-325 MG PO TABS
1.0000 | ORAL_TABLET | ORAL | Status: DC | PRN
  Administered 2017-06-01 – 2017-06-02 (×2): 2 via ORAL
  Filled 2017-06-01: qty 1
  Filled 2017-06-01: qty 2
  Filled 2017-06-01: qty 1

## 2017-06-01 MED ORDER — SUGAMMADEX SODIUM 200 MG/2ML IV SOLN
INTRAVENOUS | Status: DC | PRN
  Administered 2017-06-01: 160 mg via INTRAVENOUS

## 2017-06-01 MED ORDER — DEXAMETHASONE SODIUM PHOSPHATE 10 MG/ML IJ SOLN
INTRAMUSCULAR | Status: AC
  Filled 2017-06-01: qty 1

## 2017-06-01 SURGICAL SUPPLY — 47 items
CHLORAPREP W/TINT 26ML (MISCELLANEOUS) ×2 IMPLANT
COVER BACK TABLE 60X90IN (DRAPES) IMPLANT
COVER SURGICAL LIGHT HANDLE (MISCELLANEOUS) ×2 IMPLANT
COVER TIP SHEARS 8 DVNC (MISCELLANEOUS) ×1 IMPLANT
COVER TIP SHEARS 8MM DA VINCI (MISCELLANEOUS) ×1
DECANTER SPIKE VIAL GLASS SM (MISCELLANEOUS) IMPLANT
DERMABOND ADVANCED (GAUZE/BANDAGES/DRESSINGS) ×1
DERMABOND ADVANCED .7 DNX12 (GAUZE/BANDAGES/DRESSINGS) ×1 IMPLANT
DRAPE ARM DVNC X/XI (DISPOSABLE) ×4 IMPLANT
DRAPE COLUMN DVNC XI (DISPOSABLE) ×1 IMPLANT
DRAPE DA VINCI XI ARM (DISPOSABLE) ×4
DRAPE DA VINCI XI COLUMN (DISPOSABLE) ×1
DRAPE SHEET LG 3/4 BI-LAMINATE (DRAPES) ×4 IMPLANT
DRAPE SURG IRRIG POUCH 19X23 (DRAPES) ×2 IMPLANT
ELECT REM PT RETURN 15FT ADLT (MISCELLANEOUS) ×2 IMPLANT
GLOVE BIO SURGEON STRL SZ 6.5 (GLOVE) ×8 IMPLANT
GLOVE BIO SURGEON STRL SZ7.5 (GLOVE) ×4 IMPLANT
GLOVE BIOGEL PI IND STRL 8 (GLOVE) ×2 IMPLANT
GLOVE BIOGEL PI INDICATOR 8 (GLOVE) ×2
GOWN STRL REUS W/ TWL XL LVL3 (GOWN DISPOSABLE) ×3 IMPLANT
GOWN STRL REUS W/TWL XL LVL3 (GOWN DISPOSABLE) ×3
HOLDER FOLEY CATH W/STRAP (MISCELLANEOUS) ×2 IMPLANT
IRRIG SUCT STRYKERFLOW 2 WTIP (MISCELLANEOUS) ×2
IRRIGATION SUCT STRKRFLW 2 WTP (MISCELLANEOUS) ×1 IMPLANT
MANIPULATOR UTERINE 4.5 ZUMI (MISCELLANEOUS) IMPLANT
MARKER SKIN DUAL TIP RULER LAB (MISCELLANEOUS) ×2 IMPLANT
OCCLUDER COLPOPNEUMO (BALLOONS) IMPLANT
PAD POSITIONING PINK XL (MISCELLANEOUS) ×2 IMPLANT
PORT ACCESS TROCAR AIRSEAL 12 (TROCAR) ×1 IMPLANT
PORT ACCESS TROCAR AIRSEAL 5M (TROCAR) ×1
POUCH SPECIMEN RETRIEVAL 10MM (ENDOMECHANICALS) ×4 IMPLANT
SCISSORS LAP 5X35 DISP (ENDOMECHANICALS) ×2 IMPLANT
SEAL CANN UNIV 5-8 DVNC XI (MISCELLANEOUS) ×4 IMPLANT
SEAL XI 5MM-8MM UNIVERSAL (MISCELLANEOUS) ×4
SET TRI-LUMEN FLTR TB AIRSEAL (TUBING) ×2 IMPLANT
SHEET LAVH (DRAPES) ×2 IMPLANT
SOLUTION ELECTROLUBE (MISCELLANEOUS) ×2 IMPLANT
SPONGE LAP 18X18 X RAY DECT (DISPOSABLE) IMPLANT
SUT MNCRL AB 4-0 PS2 18 (SUTURE) ×4 IMPLANT
SUT VIC AB 0 CT1 27 (SUTURE)
SUT VIC AB 0 CT1 27XBRD ANTBC (SUTURE) IMPLANT
TOWEL OR 17X26 10 PK STRL BLUE (TOWEL DISPOSABLE) ×4 IMPLANT
TRAP SPECIMEN MUCOUS 40CC (MISCELLANEOUS) ×2 IMPLANT
TRAY LAPAROSCOPIC (CUSTOM PROCEDURE TRAY) ×2 IMPLANT
TROCAR BLADELESS OPT 5 100 (ENDOMECHANICALS) ×2 IMPLANT
UNDERPAD 30X30 (UNDERPADS AND DIAPERS) ×2 IMPLANT
WATER STERILE IRR 1000ML POUR (IV SOLUTION) ×4 IMPLANT

## 2017-06-01 NOTE — Anesthesia Postprocedure Evaluation (Signed)
Anesthesia Post Note  Patient: Alison [name]  Procedure(s) Performed: Procedure(s) (LRB): XI ROBOTIC ASSISTED BILATERAL SALPINGO OOPHORECTOMY AND PELVIC WASHINGS (N/A) POSSIBLE DEBULKING (N/A)     Patient location during evaluation: PACU Anesthesia Type: General Level of consciousness: sedated Pain management: pain level controlled Vital Signs Assessment: post-procedure vital signs reviewed and stable Respiratory status: spontaneous breathing and respiratory function stable Cardiovascular status: stable Anesthetic complications: no    Last Vitals:  Vitals:   06/01/17 1230 06/01/17 1237  BP: (!) 144/86   Pulse: 91 88  Resp: 20 18  Temp:      Last Pain:  Vitals:   06/01/17 1215  TempSrc:   PainSc: 0-No pain                 Zoeie Ritter DANIEL

## 2017-06-01 NOTE — H&P (View-Only) (Signed)
PRE OP NOTE  Referring Clinician:  Dr. Bard Herbert 56 y.o. female  CC:  Chief Complaint  Patient presents with  . BRCA 2 Gene Mutation    Assessment/Plan:  [name] is a 56 y.o. 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 Additionally, 1 VUS, called "c.16A>C (p.Thr6Pro)" was found in one copy of the MSH6 gene.  Adnexa additionally are normal on UTZ and CA 125  Is wnl.  There is a remote history of hysterectomy.  A surgery date of June 01, 2017 with me is tentatively in place with the plan for robotic-assisted bilateral salpingo-oophorectomy with washings.  The risks of this procedure been discussed with [name] and her mother Alison [name] and inclusive of infection bleeding damage to surrounding structures and prolonged hospitalization. They are aware of a 4 possibility of reoperation for surgical staging if microscopic evaluation determines the presence of a malignancy.  If there is intraoperative diagnosis of  malignancy staging and debulking will be performed which may include laparotomy and bowel procedures.     HPI: [name] is a 56 y.o.   5 para 5 with a diagnosis of a right upper outer quadrant breast mass in December 2016. She subsequently has undergone lumpectomy and adjuvant chemotherapy Adriamycin and Cytoxan followed by radiation therapy which was completed in September 2017. 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 Additionally, 1 VUS, called "c.16A>C (p.Thr6Pro)" was found in one copy of the MSH6 gene .  Alison [name]  Has a diagnosis of schizophrenia and defers to her mother for her medical history. They  report NSVD 4, one cesarean section and a bilateral tubal ligation. Family history is notable for mother with breast cancer diagnosed at the age of 78 a maternal aunt with ovarian cancer diagnosed in his 59s a  maternal cousin diagnosed with breast cancer at an unspecified age. There are 2 maternal uncles with prostate cancer diagnosed at an unspecified age and a brother with vocal cord cancer.   [name] is unclear about when she became menopausal.  Both she and her mother appear unaware of a prior hysterectomy.  Review of the medical record is notable for a note in 2015 within the past surgical history documentation of a laparoscopic vaginal hysterectomy. An MRI of the hip in 2007 notes that the uterus is not visualized.  Pelvic ultrasound 03/21/2017 confirms the absence of a uterus.  Left ovary 2.2x1.0x1.8 with a 53m calcification along the edge R ovary 3.0x1.3x2.2cm .  No free fluid.  CA 125 03/2017 9.9   Review of Systems:  Constitutional  Feels well,  Cardiovascular  No chest pain, shortness of breath, or edema  Pulmonary  No cough or wheeze.  Gastro Intestinal  No nausea, vomitting, or diarrhoea. No bright red blood per rectum, no abdominal pain, change in bowel movement, or constipation. No abdominal bloating excellent appetite Genito Urinary  No frequency, urgency, dysuria, no vaginal bleeding Musculo Skeletal  No myalgia, arthralgia, joint swelling or pain  Neurologic  No weakness, numbness, change in gait,  Psychology  No depression, anxiety, insomnia.    Current Meds:  Outpatient Encounter Prescriptions as of 05/18/2017  Medication Sig  . acetaminophen (TYLENOL) 325 MG tablet Take 650 mg by mouth every 6 (six) hours as needed for mild pain.  . benztropine (COGENTIN) 0.5 MG tablet Take 0.5 mg by mouth 2 (two) times daily.  . carboxymethylcellulose (REFRESH CELLUVISC) 1 %  ophthalmic solution Place 1 drop into both eyes 4 (four) times daily.   . carvedilol (COREG) 3.125 MG tablet TAKE 1 TABLET BY MOUTH TWICE DAILY.  . divalproex (DEPAKOTE ER) 500 MG 24 hr tablet Take 1,000 mg by mouth at bedtime.  . furosemide (LASIX) 40 MG tablet Take 40 mg by mouth every morning.  Marland Kitchen Lifitegrast  (XIIDRA) 5 % SOLN Place 1 drop into both eyes 2 (two) times daily.  Marland Kitchen lisinopril (PRINIVIL,ZESTRIL) 10 MG tablet Take 10 mg by mouth every morning.   . metFORMIN (GLUCOPHAGE) 500 MG tablet Take by mouth 2 (two) times daily with a meal.  . OLANZapine (ZYPREXA) 10 MG tablet Take 10 mg by mouth at bedtime.  . Olopatadine HCl (PAZEO) 0.7 % SOLN Apply 1 drop to eye every evening. 1 drop in each eye every evening.  Marland Kitchen omeprazole (PRILOSEC) 20 MG capsule Take 40 mg by mouth every morning.  . simvastatin (ZOCOR) 10 MG tablet Take 10 mg by mouth at bedtime.   Facility-Administered Encounter Medications as of 05/18/2017  Medication  . 0.9 %  sodium chloride infusion  . sodium chloride flush (NS) 0.9 % injection 10 mL    Allergy: No Known Allergies  Social Hx:   Social History   Social History  . Marital status: Single    Spouse name: N/A  . Number of children: N/A  . Years of education: N/A   Occupational History  . Not on file.   Social History Main Topics  . Smoking status: Former Smoker    Packs/day: 0.25    Types: Cigarettes    Quit date: 12/2015  . Smokeless tobacco: Never Used  . Alcohol use Yes     Comment: "only on special occasions"   . Drug use: No  . Sexual activity: Yes    Birth control/ protection: Surgical   Other Topics Concern  . Not on file   Social History Narrative  . No narrative on file    Past Surgical Hx:  Past Surgical History:  Procedure Laterality Date  . ABDOMINAL HYSTERECTOMY    . BREAST LUMPECTOMY Right 2017  . BREAST LUMPECTOMY WITH RADIOACTIVE SEED AND SENTINEL LYMPH NODE BIOPSY Right 01/20/2016   Procedure: RIGHT BREAST LUMPECTOMY WITH RADIOACTIVE SEED AND SENTINEL LYMPH NODE BIOPSY;  Surgeon: Coralie Keens, MD;  Location: [facility];  Service: General;  Laterality: Right;  . CHOLECYSTECTOMY N/A 04/16/2014   Procedure: LAPAROSCOPIC CHOLECYSTECTOMY ;  Surgeon: [name]. Georgette Dover, MD;  Location: [facility];  Service: General;   Laterality: N/A;  . PORT-A-CATH REMOVAL Left 10/13/2016   Procedure: REMOVAL PORT-A-CATH;  Surgeon: Coralie Keens, MD;  Location: [facility];  Service: General;  Laterality: Left;  . PORTACATH PLACEMENT Left 01/20/2016   Procedure: INSERTION PORT-A-CATH;  Surgeon: Coralie Keens, MD;  Location: [facility];  Service: General;  Laterality: Left;    Past Medical Hx:  Past Medical History:  Diagnosis Date  . Anemia   . Cancer ([place])   . Depression    chronic   . Diabetes mellitus without complication ([place])    type II  . Gallstones   . GERD (gastroesophageal reflux disease)   . History of radiation therapy 07/25/16- 09/05/16   Right Breast  . Hyperlipidemia   . Hypertension   . Schizophrenia ([place])   Bilateral cataracts   Past Gynecological History: Gravida 5 para 5 unknown dates of menarche or menopause. Reports prior bilateral tubal ligation, reports a normal Pap test last year and  denies any history of abnormal Pap tests  No LMP recorded. Patient has had a hysterectomy. Per the medical record  Family Hx:  Family History  Problem Relation Age of Onset  . Breast cancer Mother 79       s/p partial mastectomy  . Skin cancer Mother        on right knee; unspecified type  . Colon polyps Mother        three total  . Other Mother 53       history of a TAH-BSO for bleeding  . Prostate cancer Maternal Uncle        (x2 maternal uncles)  . Cirrhosis Maternal Grandfather        +EtOH  . Cancer Brother 55       vocal cord ca; not a smoker  . Autism Brother   . Cancer Maternal Uncle        vocal cord ca; +smoker  . Ovarian cancer Maternal Aunt        dx. >50  . Cancer Maternal Aunt        unspecified type  . Diabetes Maternal Aunt   . Colon cancer Cousin        (x2 maternal first cousins--sisters)  . Breast cancer Cousin        dx. 62 or younger  . Cancer Other        NOS cancer; lim info; all three of his daughters had cancer as well  . Breast cancer Other      Vitals:  Blood pressure 130/87, pulse 88, temperature 98.5 F (36.9 C), temperature source Oral, resp. rate 20, weight 173 lb 8 oz (78.7 kg).  Physical Exam: WD in NAD Neck  Supple NROM, without any enlargements.  Lymph Node Survey No cervical supraclavicular or inguinal adenopathy Cardiovascular  Pulse normal rate, regularity and rhythm.  Lungs  Clear to auscultation bilaterally, Good air movement.  Skin  No rash/lesions/breakdown  Psychiatry  Alert and oriented appropriate mood affect speech and reasoning. Abdomen  Normoactive bowel sounds, abdomen soft, non-tender. Midline vertical incision intact without evidence of hernia.  Back No CVA tenderness Pelvic deferred.  Extremities  No bilateral cyanosis, clubbing or edema.   Janie Morning, MD, PhD 05/18/2017, 10:48 AM

## 2017-06-01 NOTE — Telephone Encounter (Signed)
Scheduled post op appt, patient will receive with discharge summary

## 2017-06-01 NOTE — Transfer of Care (Signed)
Immediate Anesthesia Transfer of Care Note  Patient: Alison [name]  Procedure(s) Performed: Procedure(s): XI ROBOTIC ASSISTED BILATERAL SALPINGO OOPHORECTOMY AND PELVIC WASHINGS (N/A) POSSIBLE DEBULKING (N/A)  Patient Location: PACU  Anesthesia Type:General  Level of Consciousness: drowsy and patient cooperative  Airway & Oxygen Therapy: Patient Spontanous Breathing and Patient connected to T-piece oxygen  Post-op Assessment: Report given to RN, Post -op Vital signs reviewed and stable and Patient moving all extremities  Post vital signs: Reviewed and stable  Last Vitals:  Vitals:   06/01/17 0542  BP: 118/80  Pulse: 90  Resp: 18  Temp: 36.8 C    Last Pain:  Vitals:   06/01/17 0542  TempSrc: Oral      Patients Stated Pain Goal: 4 (33/82/50 5397)  Complications: No apparent anesthesia complications

## 2017-06-01 NOTE — Interval H&P Note (Signed)
History and Physical Interval Note:  06/01/2017 8:12 AM  Alison [name]  has presented today for surgery, with the diagnosis of BRCA 2  The various methods of treatment have been discussed with the patient and family. After consideration of risks, benefits and other options for treatment, the patient has consented to  Procedure(s): XI ROBOTIC ASSISTED BILATERAL SALPINGO OOPHORECTOMY AND PELVIC WASHINGS AND POSSIBLE STAGING AND DEBULKING (N/A) POSSIBLE DEBULKING (N/A) as a surgical intervention .  The patient's history has been reviewed, patient examined, no change in status, stable for surgery.  I have reviewed the patient's chart and labs.  Questions were answered to the patient's satisfaction.     Leona [place], [facility]

## 2017-06-01 NOTE — Progress Notes (Addendum)
Dr Tobias Alexander Aware of Low o2 sats in pacu on RA and admission orders for med surg bed for observation per Dr Skeet Latch.  Order received for Cont pulse ox and nasal O2

## 2017-06-01 NOTE — Anesthesia Procedure Notes (Signed)
Procedure Name: Intubation Date/Time: 06/01/2017 8:23 AM Performed by: Carleene Cooper A Pre-anesthesia Checklist: Patient identified, Emergency Drugs available, Suction available, Patient being monitored and Timeout performed Patient Re-evaluated:Patient Re-evaluated prior to inductionOxygen Delivery Method: Circle system utilized Preoxygenation: Pre-oxygenation with 100% oxygen Intubation Type: IV induction Ventilation: Mask ventilation without difficulty Laryngoscope Size: Mac and 4 Grade View: Grade I Tube type: Oral Tube size: 7.5 mm Number of attempts: 1 Airway Equipment and Method: Stylet Placement Confirmation: ETT inserted through vocal cords under direct vision,  positive ETCO2 and breath sounds checked- equal and bilateral Secured at: 21 cm Tube secured with: Tape Dental Injury: Teeth and Oropharynx as per pre-operative assessment

## 2017-06-01 NOTE — Progress Notes (Signed)
Patient opening eyes- holding head off of bed 6 seconds- MAE X4- bilateral hand grips strong-patient orally suctioned- ETT removed without difficulty by Dr. Nyoka Cowden and patient place on Aerosol Mask FI02 40%

## 2017-06-01 NOTE — Op Note (Addendum)
OPERATIVE NOTE  Preoperative Diagnosis: Deleterious BRCA mutation  Postoperative Diagnosis: Deleterious BRCA mutation  Procedure(s) Performed: Risk reducing robotic laparoscopic bilateral salpingo oophorectomy, pelvic washings   Anesthesia: Gen. endotracheal  Surgeon: Francetta Found.  Skeet Latch, M.D. PhD  Assistant Surgeon: Lahoma Crocker MD.   Specimens: Pelvic washings bilateral tubes and ovaries   Estimated Blood Loss: Minimal mL.   Complications: None  Indication for Procedure: This is a 56 y.o. with prior hysterectomy with a deleterious BRCA mutation.  Operative Findings: Bilateral adnexa adherent to the vaginal cuff and bladder.  No evidence of ascites normal-appearing appendix omentum liver surface and diaphragmatic surfaces. No evidence of metastatic disease.    Procedure: Patient was taken to the operating room and placed under general endotracheal anesthesia without any difficulty. She is placed in the dorsal lithotomy position and secured to the operative table over the chest with tape.   The patient was prepped and draped and the uterine manipulator placed within the endometrial cavity. A sponge stick was placed in the vagina The balloon was placed within the vagina. An OG tube was present and functional. At an area on the left in line with the nipple approximately 2 cm below the ribs a skin incision was made and a 5 mm Optiview inserted under direct visualization. The abdomen was insufflated to 15 mm of mercury and the pressure never deviated above that throughout the remainder of the procedure. Maximum Trendelenburg positioning was obtained. At approximately 22 cm proximal to the symphysis pubis an incision was made just superior to the umbilicus.  the location 10 cm bilateral to this incision 8 mm Millimeter robotic ports were placed in the other 3 incisions. Adhesions inferior to the umbilicus of the omentum were sharply resected.  to the anterior abdominal wall were released.   The left upper quadrant port site was replaced with a 12 mm port. This was all completed under direct visualization. The small and large bowel were reflected as much as possible into the upper abdomen. The robot was docked and instruments placed.  Wasings were collected.  The right peritoneum was transected just lateral to the infundibulopelvic ligament and the ureter was identified. The right infundibulopelvic ligament was cauterized and transected  The right adnexa was sharply transected from the vagina.   The right adnexa was placed in Endo Catch bag. In a similar manner the left salpingo-oophorectomy was performed. The ovary was adherent to the vaginal cuff.  The adhesive disease was resected to isolate the left adnexa.  The areas of surgical dissection were inspected and hemostasis was assured. The pelvis was irrigated and drained. The robot was undocked and the specimens were delivered through the left upper quadrant port   The instruments were removed from the abdomen and pelvis and the port sites irrigated. The continuous tissue in the left upper quadrant was closed with an interrupted 0 Vicryl  suture. All skin incisions were closed with a subcuticular suture.  The vaginal vault was cleared with a moist sponge stick.  Sponge, lap and needle counts were correct x 3.    The patient had sequential compression devices.         Disposition: PACU - hemodynamically stable.         Condition:stable Foley draining clear urine.

## 2017-06-02 DIAGNOSIS — Z1501 Genetic susceptibility to malignant neoplasm of breast: Secondary | ICD-10-CM | POA: Diagnosis not present

## 2017-06-02 LAB — GLUCOSE, CAPILLARY
Glucose-Capillary: 178 mg/dL — ABNORMAL HIGH (ref 65–99)
Glucose-Capillary: 166 mg/dL — ABNORMAL HIGH (ref 65–99)
Glucose-Capillary: 177 mg/dL — ABNORMAL HIGH (ref 65–99)

## 2017-06-02 NOTE — Discharge Instructions (Signed)
Activity: 1. Be up and out of the bed during the day.  Take a nap if needed.  You may walk up steps but be careful and use the hand rail.  Stair climbing will tire you more than you think, you may need to stop part way and rest.   2. No lifting or straining for 6 weeks.  3. No driving for 1-2 weeks.  Do Not drive if you are taking narcotic pain medicine.  4. Shower daily.  Use soap and water on your incision and pat dry; don't rub.   5. No sexual activity and nothing in the vagina for 4 weeks.  Diet: 1. Low sodium Heart Healthy Diet is recommended.  2. It is safe to use a laxative if you have difficulty moving your bowels.   Wound Care: 1. Keep clean and dry.  Shower daily.  Reasons to call the Doctor:   Fever - Oral temperature greater than 100.4 degrees Fahrenheit  Foul-smelling vaginal discharge  Difficulty urinating  Nausea and vomiting  Increased pain at the site of the incision that is unrelieved with pain medicine.  Difficulty breathing with or without chest pain  New calf pain especially if only on one side  Sudden, continuing increased vaginal bleeding with or without clots.   Follow-up: 1. See Everitt Amber in 4 weeks.  Contacts: For questions or concerns you should contact:  Dr. Everitt Amber at [phone]  or at [place]    Bilateral Salpingo-Oophorectomy, Care After This sheet gives you information about how to care for yourself after your procedure. Your health care provider may also give you more specific instructions. If you have problems or questions, contact your health care provider. What can I expect after the procedure? After the procedure, it is common to have:  Abdominal pain.  Some occasional vaginal bleeding (spotting).  Tiredness.  Symptoms of menopause, such as hot flashes, night sweats, or mood swings.  Follow these instructions at home: Incision care  Keep your incision area and your bandage (dressing) clean and  dry.  Follow instructions from your health care provider about how to take care of your incision. Make sure you: ? Wash your hands with soap and water before you change your dressing. If soap and water are not available, use hand sanitizer. ? Change your dressing as told by your health care provider. ? Leave stitches (sutures), staples, skin glue, or adhesive strips in place. These skin closures may need to stay in place for 2 weeks or longer. If adhesive strip edges start to loosen and curl up, you may trim the loose edges. Do not remove adhesive strips completely unless your health care provider tells you to do that.  Check your incision area every day for signs of infection. Check for: ? Redness, swelling, or pain. ? Fluid or blood. ? Warmth. ? Pus or a bad smell. Activity  Do not drive or use heavy machinery while taking prescription pain medicine.  Do not drive for 24 hours if you received a medicine to help you relax (sedative) during your procedure.  Take frequent, short walks throughout the day. Rest when you get tired. Ask your health care provider what activities are safe for you.  Avoid activity that requires great effort. Also, avoid heavy lifting. Do not lift anything that is heavier than 10 lbs. (4.5 kg), or the limit that your health care provider tells you, until he or she says that it is safe to do so.  Do not  douche, use tampons, or have sex until your health care provider approves. General instructions  To prevent or treat constipation while you are taking prescription pain medicine, your health care provider may recommend that you: ? Drink enough fluid to keep your urine clear or pale yellow. ? Take over-the-counter or prescription medicines. ? Eat foods that are high in fiber, such as fresh fruits and vegetables, whole grains, and beans. ? Limit foods that are high in fat and processed sugars, such as fried and sweet foods.  Take over-the-counter and prescription  medicines only as told by your health care provider.  Do not take baths, swim, or use a hot tub until your health care provider approves. Ask your health care provider if you can take showers. You may only be allowed to take sponge baths for bathing.  Wear compression stockings as told by your health care provider. These stockings help to prevent blood clots and reduce swelling in your legs.  Keep all follow-up visits as told by your health care provider. This is important. Contact a health care provider if:  You have pain when you urinate.  You have pus or a bad smelling discharge coming from your vagina.  You have redness, swelling, or pain around your incision.  You have fluid or blood coming from your incision.  Your incision feels warm to the touch.  You have pus or a bad smell coming from your incision.  You have a fever.  Your incision starts to break open.  You have pain in the abdomen, and it gets worse or does not get better when you take medicine.  You develop a rash.  You develop nausea and vomiting.  You feel lightheaded. Get help right away if:  You develop pain in your chest or leg.  You become short of breath.  You faint.  You have increased bleeding from your vagina. Summary  After the procedure, it is common to have pain, bleeding in the vagina, and symptoms of menopause.  Follow instructions from your health care provider about how to take care of your incision.  Follow instructions from your health care provider about activities and restrictions.  Check your incision every day for signs of infection and report any symptoms to your health care provider. This information is not intended to replace advice given to you by your health care provider. Make sure you discuss any questions you have with your health care provider. Document Released: 11/28/2005 Document Revised: 01/02/2017 Document Reviewed: 01/02/2017 Elsevier Interactive Patient Education   [employer].

## 2017-06-02 NOTE — Clinical Social Work Note (Signed)
Clinical Social Work Assessment  Patient Details  Name: Alison [name] MRN: [id number] Date of Birth: 01-04-1961  Date of referral:  06/02/17               Reason for consult:  Facility Placement, Discharge Planning                Permission sought to share information with:  Facility Art therapist granted to share information::  Yes, Verbal Permission Granted  Name::        Agency::     Relationship::     Contact Information:     Housing/Transportation Living arrangements for the past 2 months:  [place] of Information:  Patient, Facility Patient Interpreter Needed:  None Criminal Activity/Legal Involvement Pertinent to Current Situation/Hospitalization:  No - Comment as needed Significant Relationships:  Parents Lives with:  Facility Resident Do you feel safe going back to the place where you live?  Yes Need for family participation in patient care:  Yes (Comment)  Care giving concerns:  No concerns at this time.   Social Worker assessment / plan:  Pt hospitalized from [facility] on 06/01/17 under out pt in bed status for BRCA 2 Gene Mutation and surgery. CSW met with pt today to assist with d/c planning. Pt reports that when she is ready for d/c her mother will pick her up and [facility] CSW spoke with staff at [facility] to confirm dc plan. CSW informed that residents are at Day Programs until 2 pm. Staff report back to [facility] just prior to 2pm. Pt can return this afternoon. CSW has left a VM for pt's mother. Genoveva Ill [phone] to contact CSW for assistance with d/c planning.             Employment status:  Disabled (Comment on whether or not currently receiving Disability) Insurance information:  Medicaid In [place] PT Recommendations:  Not assessed at this time Information / Referral to community resources:     Patient/Family's Response to care:  Pt is looking forward to returning to [facility].  Patient/Family's Understanding of and Emotional Response to Diagnosis, Current Treatment, and Prognosis: Pt reports that she is feeling better today. Pt appreciates CSW assistance with d/c planning.  Emotional Assessment Appearance:  Appears stated age Attitude/Demeanor/Rapport:  Other (cooperative) Affect (typically observed):  Appropriate, Pleasant Orientation:  Oriented to Self, Oriented to Place, Oriented to  Time, Oriented to Situation Alcohol / Substance use:  Not Applicable Psych involvement (Current and /or in the community):  No (Comment)  Discharge Needs  Concerns to be addressed:  Discharge Planning Concerns Readmission within the last 30 days:  No Current discharge risk:  None Barriers to Discharge:  No Barriers Identified   Luretha Rued, [place] 06/02/2017, 9:00 AM

## 2017-06-02 NOTE — Progress Notes (Signed)
Discharge paper work discussed with patient and her mother.  Iv is removed and am assessment unchanged.

## 2017-06-05 ENCOUNTER — Telehealth: Payer: Self-pay

## 2017-06-05 NOTE — Telephone Encounter (Signed)
-----   Message from Dorothyann Gibbs, NP sent at 06/05/2017 11:11 AM EDT -----  Please see how she is post-op and inform her that final path with no abnormalities.  Thank you Melissa ----- Message ----- From: Interface, Lab In [facility] Sent: 06/02/2017   1:52 PM To: Dorothyann Gibbs, NP

## 2017-06-05 NOTE — Telephone Encounter (Signed)
Told Alison [name] the results of the surgical pathology from 06-01-17 as noted below by Alison Cross,Alison [name]. Alison [name] is doing well.  No pain or other issues noted postoperatively.

## 2017-06-08 ENCOUNTER — Other Ambulatory Visit: Payer: Self-pay | Admitting: Hematology and Oncology

## 2017-06-08 DIAGNOSIS — Z1231 Encounter for screening mammogram for malignant neoplasm of breast: Secondary | ICD-10-CM

## 2017-06-08 DIAGNOSIS — N63 Unspecified lump in unspecified breast: Secondary | ICD-10-CM

## 2017-06-14 ENCOUNTER — Inpatient Hospital Stay (HOSPITAL_COMMUNITY)
Admission: EM | Admit: 2017-06-14 | Discharge: 2017-06-16 | DRG: 921 | Disposition: A | Discharge status: Home Health | Payer: Medicaid Other | Attending: Family Medicine | Admitting: Family Medicine

## 2017-06-14 ENCOUNTER — Encounter (HOSPITAL_COMMUNITY): Payer: Self-pay | Admitting: Emergency Medicine

## 2017-06-14 ENCOUNTER — Emergency Department (HOSPITAL_COMMUNITY): Payer: Medicaid Other

## 2017-06-14 DIAGNOSIS — Z79899 Other long term (current) drug therapy: Secondary | ICD-10-CM

## 2017-06-14 DIAGNOSIS — Z79891 Long term (current) use of opiate analgesic: Secondary | ICD-10-CM | POA: Diagnosis not present

## 2017-06-14 DIAGNOSIS — Z87891 Personal history of nicotine dependence: Secondary | ICD-10-CM

## 2017-06-14 DIAGNOSIS — Z1502 Genetic susceptibility to malignant neoplasm of ovary: Secondary | ICD-10-CM | POA: Diagnosis not present

## 2017-06-14 DIAGNOSIS — K219 Gastro-esophageal reflux disease without esophagitis: Secondary | ICD-10-CM | POA: Diagnosis present

## 2017-06-14 DIAGNOSIS — I10 Essential (primary) hypertension: Secondary | ICD-10-CM | POA: Diagnosis present

## 2017-06-14 DIAGNOSIS — Z803 Family history of malignant neoplasm of breast: Secondary | ICD-10-CM

## 2017-06-14 DIAGNOSIS — Z794 Long term (current) use of insulin: Secondary | ICD-10-CM | POA: Diagnosis not present

## 2017-06-14 DIAGNOSIS — F329 Major depressive disorder, single episode, unspecified: Secondary | ICD-10-CM | POA: Diagnosis present

## 2017-06-14 DIAGNOSIS — Z90710 Acquired absence of both cervix and uterus: Secondary | ICD-10-CM | POA: Diagnosis not present

## 2017-06-14 DIAGNOSIS — Z7984 Long term (current) use of oral hypoglycemic drugs: Secondary | ICD-10-CM

## 2017-06-14 DIAGNOSIS — E119 Type 2 diabetes mellitus without complications: Secondary | ICD-10-CM | POA: Diagnosis present

## 2017-06-14 DIAGNOSIS — E118 Type 2 diabetes mellitus with unspecified complications: Secondary | ICD-10-CM

## 2017-06-14 DIAGNOSIS — N99841 Postprocedural hematoma of a genitourinary system organ or structure following other procedure: Secondary | ICD-10-CM | POA: Diagnosis present

## 2017-06-14 DIAGNOSIS — Z853 Personal history of malignant neoplasm of breast: Secondary | ICD-10-CM | POA: Diagnosis not present

## 2017-06-14 DIAGNOSIS — R188 Other ascites: Secondary | ICD-10-CM | POA: Diagnosis present

## 2017-06-14 DIAGNOSIS — F209 Schizophrenia, unspecified: Secondary | ICD-10-CM | POA: Diagnosis present

## 2017-06-14 DIAGNOSIS — E785 Hyperlipidemia, unspecified: Secondary | ICD-10-CM | POA: Diagnosis present

## 2017-06-14 DIAGNOSIS — N99840 Postprocedural hematoma of a genitourinary system organ or structure following a genitourinary system procedure: Secondary | ICD-10-CM | POA: Diagnosis present

## 2017-06-14 DIAGNOSIS — R339 Retention of urine, unspecified: Secondary | ICD-10-CM | POA: Diagnosis present

## 2017-06-14 DIAGNOSIS — Y836 Removal of other organ (partial) (total) as the cause of abnormal reaction of the patient, or of later complication, without mention of misadventure at the time of the procedure: Secondary | ICD-10-CM | POA: Diagnosis present

## 2017-06-14 DIAGNOSIS — Z90722 Acquired absence of ovaries, bilateral: Secondary | ICD-10-CM | POA: Diagnosis not present

## 2017-06-14 DIAGNOSIS — Z923 Personal history of irradiation: Secondary | ICD-10-CM | POA: Diagnosis not present

## 2017-06-14 DIAGNOSIS — R1032 Left lower quadrant pain: Secondary | ICD-10-CM

## 2017-06-14 DIAGNOSIS — Z9049 Acquired absence of other specified parts of digestive tract: Secondary | ICD-10-CM

## 2017-06-14 DIAGNOSIS — R109 Unspecified abdominal pain: Secondary | ICD-10-CM

## 2017-06-14 LAB — URINALYSIS, ROUTINE W REFLEX MICROSCOPIC
Bilirubin Urine: NEGATIVE
Glucose, UA: NEGATIVE mg/dL
Hgb urine dipstick: NEGATIVE
Ketones, ur: NEGATIVE mg/dL
Leukocytes, UA: NEGATIVE
Nitrite: NEGATIVE
Protein, ur: NEGATIVE mg/dL
Specific Gravity, Urine: 1.01 (ref 1.005–1.030)
pH: 6 (ref 5.0–8.0)

## 2017-06-14 LAB — COMPREHENSIVE METABOLIC PANEL
ALT: 23 U/L (ref 14–54)
AST: 23 U/L (ref 15–41)
Albumin: 3.8 g/dL (ref 3.5–5.0)
Alkaline Phosphatase: 87 U/L (ref 38–126)
Anion gap: 11 (ref 5–15)
BUN: 10 mg/dL (ref 6–20)
CO2: 26 mmol/L (ref 22–32)
Calcium: 8.9 mg/dL (ref 8.9–10.3)
Chloride: 103 mmol/L (ref 101–111)
Creatinine, Ser: 0.68 mg/dL (ref 0.44–1.00)
GFR calc Af Amer: >60 mL/min (ref >60)
GFR calc non Af Amer: >60 mL/min (ref >60)
Glucose, Bld: 106 mg/dL — ABNORMAL HIGH (ref 65–99)
Potassium: 3.9 mmol/L (ref 3.5–5.1)
Sodium: 140 mmol/L (ref 135–145)
Total Bilirubin: 0.5 mg/dL (ref 0.3–1.2)
Total Protein: 7.5 g/dL (ref 6.5–8.1)

## 2017-06-14 LAB — CBC WITH DIFFERENTIAL/PLATELET
Basophils Absolute: 0 10*3/uL (ref 0.0–0.1)
Basophils Relative: 0 %
Eosinophils Absolute: 0.1 10*3/uL (ref 0.0–0.7)
Eosinophils Relative: 2 %
HCT: 29.7 % — ABNORMAL LOW (ref 36.0–46.0)
Hemoglobin: 9.9 g/dL — ABNORMAL LOW (ref 12.0–15.0)
Lymphocytes Relative: 24 %
Lymphs Abs: 1.4 10*3/uL (ref 0.7–4.0)
MCH: 28.4 pg (ref 26.0–34.0)
MCHC: 33.3 g/dL (ref 30.0–36.0)
MCV: 85.3 fL (ref 78.0–100.0)
Monocytes Absolute: 0.4 10*3/uL (ref 0.1–1.0)
Monocytes Relative: 7 %
Neutro Abs: 3.8 10*3/uL (ref 1.7–7.7)
Neutrophils Relative %: 67 %
Platelets: 327 10*3/uL (ref 150–400)
RBC: 3.48 MIL/uL — ABNORMAL LOW (ref 3.87–5.11)
RDW: 13.1 % (ref 11.5–15.5)
WBC: 5.6 10*3/uL (ref 4.0–10.5)

## 2017-06-14 LAB — GLUCOSE, CAPILLARY: Glucose-Capillary: 156 mg/dL — ABNORMAL HIGH (ref 65–99)

## 2017-06-14 LAB — LIPASE, BLOOD: Lipase: 27 U/L (ref 11–51)

## 2017-06-14 MED ORDER — OXYCODONE-ACETAMINOPHEN 5-325 MG PO TABS
1.0000 | ORAL_TABLET | ORAL | Status: DC | PRN
  Administered 2017-06-15: 1 via ORAL
  Filled 2017-06-14: qty 1

## 2017-06-14 MED ORDER — IOPAMIDOL (ISOVUE-300) INJECTION 61%
100.0000 mL | Freq: Once | INTRAVENOUS | Status: AC | PRN
  Administered 2017-06-14: 100 mL via INTRAVENOUS

## 2017-06-14 MED ORDER — OLANZAPINE 5 MG PO TABS
10.0000 mg | ORAL_TABLET | Freq: Every day | ORAL | Status: DC
Start: 2017-06-14 — End: 2017-06-16
  Administered 2017-06-14 – 2017-06-15 (×2): 10 mg via ORAL
  Filled 2017-06-14 (×2): qty 2
  Filled 2017-06-14: qty 1

## 2017-06-14 MED ORDER — IBUPROFEN 200 MG PO TABS: 600.0000 mg | ORAL_TABLET | Freq: Four times a day (QID) | ORAL | Status: DC | PRN

## 2017-06-14 MED ORDER — SIMVASTATIN 10 MG PO TABS
10.0000 mg | ORAL_TABLET | Freq: Every day | ORAL | Status: DC
  Administered 2017-06-14 – 2017-06-15 (×2): 10 mg via ORAL
  Filled 2017-06-14 (×3): qty 1

## 2017-06-14 MED ORDER — PIPERACILLIN-TAZOBACTAM 3.375 G IVPB
3.3750 g | Freq: Three times a day (TID) | INTRAVENOUS | Status: DC
  Administered 2017-06-14 – 2017-06-16 (×5): 3.375 g via INTRAVENOUS
  Filled 2017-06-14 (×4): qty 50

## 2017-06-14 MED ORDER — HYDROMORPHONE HCL 1 MG/ML IJ SOLN
0.5000 mg | INTRAMUSCULAR | Status: DC | PRN
  Administered 2017-06-14: 0.5 mg via INTRAVENOUS
  Filled 2017-06-14: qty 0.5

## 2017-06-14 MED ORDER — BENZTROPINE MESYLATE 0.5 MG PO TABS
0.5000 mg | ORAL_TABLET | Freq: Two times a day (BID) | ORAL | Status: DC
  Administered 2017-06-14 – 2017-06-16 (×4): 0.5 mg via ORAL
  Filled 2017-06-14 (×6): qty 1

## 2017-06-14 MED ORDER — KETOROLAC TROMETHAMINE 30 MG/ML IJ SOLN
15.0000 mg | Freq: Once | INTRAMUSCULAR | Status: AC
  Administered 2017-06-14: 15 mg via INTRAVENOUS
  Filled 2017-06-14: qty 1

## 2017-06-14 MED ORDER — SODIUM CHLORIDE 0.45 % IV SOLN
INTRAVENOUS | Status: DC
  Administered 2017-06-14 – 2017-06-15 (×2): via INTRAVENOUS

## 2017-06-14 MED ORDER — HYDROMORPHONE HCL 1 MG/ML IJ SOLN: 0.2000 mg | INTRAMUSCULAR | Status: DC | PRN

## 2017-06-14 MED ORDER — DIVALPROEX SODIUM ER 500 MG PO TB24
1000.0000 mg | ORAL_TABLET | Freq: Every day | ORAL | Status: DC
  Administered 2017-06-14 – 2017-06-15 (×2): 1000 mg via ORAL
  Filled 2017-06-14 (×3): qty 2

## 2017-06-14 MED ORDER — SIMETHICONE 80 MG PO CHEW: 80.0000 mg | CHEWABLE_TABLET | Freq: Four times a day (QID) | ORAL | Status: DC | PRN

## 2017-06-14 MED ORDER — CARVEDILOL 3.125 MG PO TABS
3.1250 mg | ORAL_TABLET | Freq: Two times a day (BID) | ORAL | Status: DC
  Administered 2017-06-15 – 2017-06-16 (×2): 3.125 mg via ORAL
  Filled 2017-06-14 (×4): qty 1

## 2017-06-14 MED ORDER — INSULIN ASPART 100 UNIT/ML ~~LOC~~ SOLN
0.0000 [IU] | Freq: Three times a day (TID) | SUBCUTANEOUS | Status: DC
  Administered 2017-06-16: 3 [IU] via SUBCUTANEOUS

## 2017-06-14 MED ORDER — PIPERACILLIN-TAZOBACTAM 3.375 G IVPB 30 MIN
3.3750 g | Freq: Once | INTRAVENOUS | Status: AC
Start: 2017-06-14 — End: 2017-06-14
  Administered 2017-06-14: 3.375 g via INTRAVENOUS
  Filled 2017-06-14: qty 50

## 2017-06-14 MED ORDER — MORPHINE SULFATE (PF) 2 MG/ML IV SOLN
4.0000 mg | Freq: Once | INTRAVENOUS | Status: AC
  Administered 2017-06-14: 4 mg via INTRAVENOUS
  Filled 2017-06-14: qty 2

## 2017-06-14 MED ORDER — SODIUM CHLORIDE 0.9 % IV BOLUS (SEPSIS)
1000.0000 mL | Freq: Once | INTRAVENOUS | Status: AC
  Administered 2017-06-14: 1000 mL via INTRAVENOUS

## 2017-06-14 MED ORDER — PANTOPRAZOLE SODIUM 40 MG PO TBEC
40.0000 mg | DELAYED_RELEASE_TABLET | Freq: Every day | ORAL | Status: DC
  Administered 2017-06-15 – 2017-06-16 (×2): 40 mg via ORAL
  Filled 2017-06-14 (×2): qty 1

## 2017-06-14 MED ORDER — ONDANSETRON HCL 4 MG/2ML IJ SOLN: 4.0000 mg | Freq: Four times a day (QID) | INTRAMUSCULAR | Status: DC | PRN

## 2017-06-14 MED ORDER — SODIUM CHLORIDE 0.9 % IV SOLN
INTRAVENOUS | Status: DC
  Administered 2017-06-14: 12:00:00 via INTRAVENOUS

## 2017-06-14 MED ORDER — SODIUM CHLORIDE 0.9 % IV SOLN: INTRAVENOUS | Status: AC

## 2017-06-14 MED ORDER — ONDANSETRON HCL 4 MG PO TABS: 4.0000 mg | ORAL_TABLET | Freq: Four times a day (QID) | ORAL | Status: DC | PRN

## 2017-06-14 MED ORDER — LISINOPRIL 10 MG PO TABS
10.0000 mg | ORAL_TABLET | Freq: Every morning | ORAL | Status: DC
  Administered 2017-06-15 – 2017-06-16 (×2): 10 mg via ORAL
  Filled 2017-06-14 (×3): qty 1

## 2017-06-14 MED ORDER — ONDANSETRON HCL 4 MG/2ML IJ SOLN
4.0000 mg | Freq: Once | INTRAMUSCULAR | Status: AC
  Administered 2017-06-14: 4 mg via INTRAVENOUS
  Filled 2017-06-14: qty 2

## 2017-06-14 MED ORDER — CROMOLYN SODIUM 4 % OP SOLN
1.0000 [drp] | Freq: Four times a day (QID) | OPHTHALMIC | Status: DC
  Administered 2017-06-14 – 2017-06-16 (×6): 1 [drp] via OPHTHALMIC
  Filled 2017-06-14: qty 10

## 2017-06-14 MED ORDER — INSULIN ASPART 100 UNIT/ML ~~LOC~~ SOLN: 0.0000 [IU] | Freq: Every day | SUBCUTANEOUS | Status: DC

## 2017-06-14 MED ORDER — IOPAMIDOL (ISOVUE-300) INJECTION 61%
INTRAVENOUS | Status: AC
  Filled 2017-06-14: qty 100

## 2017-06-14 MED ORDER — MORPHINE SULFATE (PF) 4 MG/ML IV SOLN: 4.0000 mg | INTRAVENOUS | Status: DC | PRN

## 2017-06-14 MED ORDER — CYCLOSPORINE 0.05 % OP EMUL
1.0000 [drp] | Freq: Two times a day (BID) | OPHTHALMIC | Status: DC
  Administered 2017-06-15 – 2017-06-16 (×3): 1 [drp] via OPHTHALMIC
  Filled 2017-06-14 (×6): qty 1

## 2017-06-14 NOTE — ED Notes (Signed)
Blood draw attempted x2 unsuccessful 

## 2017-06-14 NOTE — ED Notes (Signed)
Bed: JJ88 Expected date:  Expected time:  Means of arrival:  Comments: EMS abd pain, from group home

## 2017-06-14 NOTE — ED Notes (Signed)
[place] [phone]

## 2017-06-14 NOTE — ED Notes (Signed)
ED Provider at bedside. 

## 2017-06-14 NOTE — ED Notes (Signed)
ED Provider at bedside. EDP JON KNAPP PRESENT. CARELINK PRESENT. REQUESTING [facility] CATH INSERTION BEFORE TRANSPORT.

## 2017-06-14 NOTE — H&P (Signed)
Alison [name] is an 56 y.o. female.   Chief Complaint: Abdominal pain HPI:  She presents s/p a robotic-assisted BSO on 06/01/17 with acute onset of "pain at the bottom of my stomach".  The pain started approximately 12 hours ago.  It is colicky in nature; the severity is a 9/10.  She denies any concomitant complaints I.e., N/V/D or fever.   Her last BM was normal yesterday.    Past Medical History:  Diagnosis Date  . Anemia   . Cancer ([place])   . Depression    chronic   . Diabetes mellitus without complication ([place])    type II  . Gallstones   . GERD (gastroesophageal reflux disease)   . History of radiation therapy 07/25/16- 09/05/16   Right Breast  . Hyperlipidemia   . Hypertension   . Schizophrenia [facility])     Past Surgical History:  Procedure Laterality Date  . ABDOMINAL HYSTERECTOMY    . BREAST LUMPECTOMY Right 2017  . BREAST LUMPECTOMY WITH RADIOACTIVE SEED AND SENTINEL LYMPH NODE BIOPSY Right 01/20/2016   Procedure: RIGHT BREAST LUMPECTOMY WITH RADIOACTIVE SEED AND SENTINEL LYMPH NODE BIOPSY;  Surgeon: Coralie Keens, MD;  Location: [facility];  Service: General;  Laterality: Right;  . CHOLECYSTECTOMY N/A 04/16/2014   Procedure: LAPAROSCOPIC CHOLECYSTECTOMY ;  Surgeon: [name]. Georgette Dover, MD;  Location: [facility];  Service: General;  Laterality: N/A;  . DEBULKING N/A 06/01/2017   Procedure: POSSIBLE DEBULKING;  Surgeon: Janie Morning, MD;  Location: [facility];  Service: Gynecology;  Laterality: N/A;  . PORT-A-CATH REMOVAL Left 10/13/2016   Procedure: REMOVAL PORT-A-CATH;  Surgeon: Coralie Keens, MD;  Location: [facility];  Service: General;  Laterality: Left;  . PORTACATH PLACEMENT Left 01/20/2016   Procedure: INSERTION PORT-A-CATH;  Surgeon: Coralie Keens, MD;  Location: [place];  Service: General;  Laterality: Left;  . ROBOTIC ASSISTED BILATERAL SALPINGO OOPHERECTOMY N/A 06/01/2017   Procedure: XI ROBOTIC ASSISTED BILATERAL SALPINGO OOPHORECTOMY AND PELVIC  WASHINGS;  Surgeon: Janie Morning, MD;  Location: [facility];  Service: Gynecology;  Laterality: N/A;    Family History  Problem Relation Age of Onset  . Breast cancer Mother 76       s/p partial mastectomy  . Skin cancer Mother        on right knee; unspecified type  . Colon polyps Mother        three total  . Other Mother 68       history of a TAH-BSO for bleeding  . Prostate cancer Maternal Uncle        (x2 maternal uncles)  . Cirrhosis Maternal Grandfather        +EtOH  . Cancer Brother 55       vocal cord ca; not a smoker  . Autism Brother   . Cancer Maternal Uncle        vocal cord ca; +smoker  . Ovarian cancer Maternal Aunt        dx. >50  . Cancer Maternal Aunt        unspecified type  . Diabetes Maternal Aunt   . Colon cancer Cousin        (x2 maternal first cousins--sisters)  . Breast cancer Cousin        dx. 17 or younger  . Cancer Other        NOS cancer; lim info; all three of his daughters had cancer as well  . Breast cancer Other    Social History:  reports that  she quit smoking about 18 months ago. Her smoking use included Cigarettes. She smoked 0.25 packs per day. She has never used smokeless tobacco. She reports that she drinks alcohol. She reports that she does not use drugs.  Allergies: No Known Allergies  Medications Prior to Admission  Medication Sig Dispense Refill  . benztropine (COGENTIN) 0.5 MG tablet Take 0.5 mg by mouth 2 (two) times daily.    . carvedilol (COREG) 3.125 MG tablet TAKE 1 TABLET BY MOUTH TWICE DAILY. 60 tablet 3  . cromolyn (OPTICROM) 4 % ophthalmic solution Place 1 drop into both eyes 4 (four) times daily.    . cycloSPORINE (RESTASIS) 0.05 % ophthalmic emulsion Place 1 drop into both eyes 2 (two) times daily.    . divalproex (DEPAKOTE ER) 500 MG 24 hr tablet Take 1,000 mg by mouth at bedtime.    . furosemide (LASIX) 40 MG tablet Take 40 mg by mouth every morning.    Marland Kitchen lisinopril (PRINIVIL,ZESTRIL) 10 MG tablet Take 10 mg by  mouth every morning.     . metFORMIN (GLUCOPHAGE) 500 MG tablet Take 500 mg by mouth 2 (two) times daily with a meal.     . OLANZapine (ZYPREXA) 10 MG tablet Take 10 mg by mouth at bedtime.    Marland Kitchen omeprazole (PRILOSEC) 20 MG capsule Take 40 mg by mouth every morning.    Marland Kitchen oxyCODONE-acetaminophen (PERCOCET/ROXICET) 5-325 MG tablet Take 1-2 tablets by mouth every 4 (four) hours as needed for severe pain. 30 tablet 0  . simvastatin (ZOCOR) 10 MG tablet Take 10 mg by mouth at bedtime.      Results for orders placed or performed during the hospital encounter of 06/14/17 (from the past 48 hour(s))  Urinalysis, Routine w reflex microscopic     Status: None   Collection Time: 06/14/17 11:00 AM  Result Value Ref Range   Color, Urine YELLOW YELLOW   APPearance CLEAR CLEAR   Specific Gravity, Urine 1.010 1.005 - 1.030   pH 6.0 5.0 - 8.0   Glucose, UA NEGATIVE NEGATIVE mg/dL   Hgb urine dipstick NEGATIVE NEGATIVE   Bilirubin Urine NEGATIVE NEGATIVE   Ketones, ur NEGATIVE NEGATIVE mg/dL   Protein, ur NEGATIVE NEGATIVE mg/dL   Nitrite NEGATIVE NEGATIVE   Leukocytes, UA NEGATIVE NEGATIVE  Comprehensive metabolic panel     Status: Abnormal   Collection Time: 06/14/17 11:13 AM  Result Value Ref Range   Sodium 140 135 - 145 mmol/L   Potassium 3.9 3.5 - 5.1 mmol/L   Chloride 103 101 - 111 mmol/L   CO2 26 22 - 32 mmol/L   Glucose, Bld 106 (H) 65 - 99 mg/dL   BUN 10 6 - 20 mg/dL   Creatinine, Ser 0.68 0.44 - 1.00 mg/dL   Calcium 8.9 8.9 - 10.3 mg/dL   Total Protein 7.5 6.5 - 8.1 g/dL   Albumin 3.8 3.5 - 5.0 g/dL   AST 23 15 - 41 U/L   ALT 23 14 - 54 U/L   Alkaline Phosphatase 87 38 - 126 U/L   Total Bilirubin 0.5 0.3 - 1.2 mg/dL   GFR calc non Af Amer >60 >60 mL/min   GFR calc Af Amer >60 >60 mL/min    Comment: (NOTE) The eGFR has been calculated using the CKD EPI equation. This calculation has not been validated in all clinical situations. eGFR's persistently <60 mL/min signify possible  Chronic Kidney Disease.    Anion gap 11 5 - 15  Lipase, blood  Status: None   Collection Time: 06/14/17 11:13 AM  Result Value Ref Range   Lipase 27 11 - 51 U/L  CBC WITH DIFFERENTIAL     Status: Abnormal   Collection Time: 06/14/17 11:13 AM  Result Value Ref Range   WBC 5.6 4.0 - 10.5 K/uL   RBC 3.48 (L) 3.87 - 5.11 MIL/uL   Hemoglobin 9.9 (L) 12.0 - 15.0 g/dL   HCT 29.7 (L) 36.0 - 46.0 %   MCV 85.3 78.0 - 100.0 fL   MCH 28.4 26.0 - 34.0 pg   MCHC 33.3 30.0 - 36.0 g/dL   RDW 13.1 11.5 - 15.5 %   Platelets 327 150 - 400 K/uL   Neutrophils Relative % 67 %   Neutro Abs 3.8 1.7 - 7.7 K/uL   Lymphocytes Relative 24 %   Lymphs Abs 1.4 0.7 - 4.0 K/uL   Monocytes Relative 7 %   Monocytes Absolute 0.4 0.1 - 1.0 K/uL   Eosinophils Relative 2 %   Eosinophils Absolute 0.1 0.0 - 0.7 K/uL   Basophils Relative 0 %   Basophils Absolute 0.0 0.0 - 0.1 K/uL   Ct Abdomen Pelvis W Contrast  Result Date: 06/14/2017 CLINICAL DATA:  Pt from [facility] family care, arrived via [facility]. Pt had partial hysterectomy 2 weeks ago, c/o lower abd pain, onset today, 9/10. Denies N/V. [facility] reports palpable mass to LLQ which is tender to touch. EXAM: CT ABDOMEN AND PELVIS WITH CONTRAST TECHNIQUE: Multidetector CT imaging of the abdomen and pelvis was performed using the standard protocol following bolus administration of intravenous contrast. CONTRAST:  180m ISOVUE-300 IOPAMIDOL (ISOVUE-300) INJECTION 61% COMPARISON:  Ultrasound the abdomen 03/21/2017 and CT abdomen and pelvis 12/28/2007 FINDINGS: Lower chest: Heart size is normal. No imaged pericardial effusion or significant coronary artery calcifications. No pulmonary nodules, pleural effusions, or infiltrates. Hepatobiliary: Status post cholecystectomy. The liver is normal in appearance. Pancreas: Unremarkable. No pancreatic ductal dilatation or surrounding inflammatory changes. Spleen: Normal in size without focal abnormality. Adrenals/Urinary Tract: The adrenal  glands are normal in appearance. There is mild prominence of the collecting system and ureters bilaterally without evidence for urinary tract obstruction. No renal mass. Stomach/Bowel: The stomach and small bowel loops are are normal in appearance. The appendix is well seen and has a normal appearance. Colon is normal in appearance. Vascular/Lymphatic: Unremarkable. Reproductive: Status post hysterectomy. The ovaries are not well seen. Other: Within the left adnexal region, there is an irregular collection with rim enhancement measuring 4.6 x 10.0 x 3.6 cm, consistent with postoperative seroma, hematoma, or abscess. This collection is contiguous with a fluid collection in the cul-de-sac which measures 5.8 by 4.5 cm. Musculoskeletal: There are degenerative changes in the lower thoracic and lumbar spine. Degenerative changes are seen in both hips. IMPRESSION: 1. Left adnexal seroma, hematoma, or abscess contiguous with a fluid collection in the cul-de-sac. 2. Status post hysterectomy. 3. Mild bilateral hydronephrosis without evidence for direct involvement of either ureter. 4. Status post cholecystectomy. 5. Normal appendix. Electronically Signed   By: ENolon NationsM.D.   On: 06/14/2017 12:45    Review of Systems  Constitutional: Negative for fever.  Gastrointestinal: Positive for abdominal pain. Negative for diarrhea, nausea and vomiting.  Genitourinary: Negative for dysuria.    Blood pressure 94/60, pulse 76, temperature 98 F (36.7 C), temperature source Oral, resp. rate 20, SpO2 96 %. Physical Exam  Constitutional: She is oriented to person, place, and time. She appears well-developed and well-nourished. No distress.  HENT:  Head: Normocephalic and atraumatic.  Neck: Neck supple.  Cardiovascular: Normal rate and regular rhythm.   Respiratory: Effort normal and breath sounds normal.  GI: Soft. She exhibits no distension and no mass. There is tenderness (mild suprapubic (after analgesic)).  There is no rebound and no guarding.  Neurological: She is alert and oriented to person, place, and time.     Assessment/Plan S/P robotic-assisted BSO with a pelvic collection--ddx seroma, hematoma or less likely abscess.  Urinary retention--possibly 2/2 to the pelvic process. H/O type 2 DM  >Admit >consult with IR for possible image-guided drainage of the collection >empiric treatment with broad spectrum antibiotic(s) >support care--analgesics/optimize glycemic control >repeat voiding trial tomorrow Agnes Lawrence, MD 06/14/2017, 4:57 PM

## 2017-06-14 NOTE — ED Notes (Signed)
FOLEY PLACED AS ORDERED

## 2017-06-14 NOTE — ED Triage Notes (Signed)
Pt from [facility] family care, arrived via [facility]. Pt had partial hysterectomy 2 weeks ago, c/o lower abd pain, onset today, 9/10. Denies N/V. [facility] reports palpable mass to LLQ which is tender to touch.

## 2017-06-14 NOTE — ED Notes (Signed)
BLOOD CULTURE X 1 OBTAINED 5ML/EACH RT AC BY Riordan Walle A RN  BLOOD CULTURE X 2 OBTAINED 5ML/EACH LEFT AC BY CHAN S EMT

## 2017-06-14 NOTE — ED Provider Notes (Addendum)
[facility] DEPT Provider Note   CSN: [id number] Arrival date & time: 06/14/17  0848     History   Chief Complaint Chief Complaint  Patient presents with  . Abdominal Pain    HPI Alison [name] is a 56 y.o. female.  HPI Patient presents to the emergency room for evaluation of abdominal pain. The patient has history of an elective hysterectomy performed couple weeks ago. Patient states she has been doing well without any particular complaints since the surgery. This morning she woke up and had severe pain in the left lower quadrant. She also had dysuria. She denies any hematuria. No fevers. No vomiting or diarrhea. EMS was called and she was brought into the emergency room for evaluation.  Past Medical History:  Diagnosis Date  . Anemia   . Cancer ([place])   . Depression    chronic   . Diabetes mellitus without complication ([place])    type II  . Gallstones   . GERD (gastroesophageal reflux disease)   . History of radiation therapy 07/25/16- 09/05/16   Right Breast  . Hyperlipidemia   . Hypertension   . Schizophrenia [facility])       Past Surgical History:  Procedure Laterality Date  . ABDOMINAL HYSTERECTOMY    . BREAST LUMPECTOMY Right 2017  . BREAST LUMPECTOMY WITH RADIOACTIVE SEED AND SENTINEL LYMPH NODE BIOPSY Right 01/20/2016   Procedure: RIGHT BREAST LUMPECTOMY WITH RADIOACTIVE SEED AND SENTINEL LYMPH NODE BIOPSY;  Surgeon: Coralie Keens, MD;  Location: [place];  Service: General;  Laterality: Right;  . CHOLECYSTECTOMY N/A 04/16/2014   Procedure: LAPAROSCOPIC CHOLECYSTECTOMY ;  Surgeon: [name]. Georgette Dover, MD;  Location[facility];  Service: General;  Laterality: N/A;  . DEBULKING N/A 06/01/2017   Procedure: POSSIBLE DEBULKING;  Surgeon: Janie Morning, MD;  [facility];  Service: Gynecology;  Laterality: N/A;  . PORT-A-CATH REMOVAL Left 10/13/2016   Procedure: REMOVAL PORT-A-CATH;  Surgeon: Coralie Keens, MD;  [facility];  Service: General;   Laterality: Left;  . PORTACATH PLACEMENT Left 01/20/2016   Procedure: INSERTION PORT-A-CATH;  Surgeon: Coralie Keens, MD;  Location: [place];  Service: General;  Laterality: Left;  . ROBOTIC ASSISTED BILATERAL SALPINGO OOPHERECTOMY N/A 06/01/2017   Procedure: XI ROBOTIC ASSISTED BILATERAL SALPINGO OOPHORECTOMY AND PELVIC WASHINGS;  Surgeon: Janie Morning, MD;  [facility];  Service: Gynecology;  Laterality: N/A;    OB History    No data available       Home Medications    Prior to Admission medications   Medication Sig Start Date End Date Taking? Authorizing Provider  benztropine (COGENTIN) 0.5 MG tablet Take 0.5 mg by mouth 2 (two) times daily.   Yes [provider]  carvedilol (COREG) 3.125 MG tablet TAKE 1 TABLET BY MOUTH TWICE DAILY. 08/17/16  Yes Larey Dresser, MD  cromolyn (OPTICROM) 4 % ophthalmic solution Place 1 drop into both eyes 4 (four) times daily.   Yes [provider]  cycloSPORINE (RESTASIS) 0.05 % ophthalmic emulsion Place 1 drop into both eyes 2 (two) times daily.   Yes [provider]  divalproex (DEPAKOTE ER) 500 MG 24 hr tablet Take 1,000 mg by mouth at bedtime.   Yes [provider]  furosemide (LASIX) 40 MG tablet Take 40 mg by mouth every morning.   Yes [provider]  lisinopril (PRINIVIL,ZESTRIL) 10 MG tablet Take 10 mg by mouth every morning.    Yes [provider]  metFORMIN (GLUCOPHAGE) 500 MG tablet  Take 500 mg by mouth 2 (two) times daily with a meal.    Yes [provider]  OLANZapine (ZYPREXA) 10 MG tablet Take 10 mg by mouth at bedtime.   Yes [provider]  omeprazole (PRILOSEC) 20 MG capsule Take 40 mg by mouth every morning.   Yes [provider]  oxyCODONE-acetaminophen (PERCOCET/ROXICET) 5-325 MG tablet Take 1-2 tablets by mouth every 4 (four) hours as needed for severe pain. 06/01/17  Yes Lahoma Cro[facility], MD  simvastatin (ZOCOR) 10 MG  tablet Take 10 mg by mouth at bedtime.   Yes [provider]    Family History Family History  Problem Relation Age of Onset  . Breast cancer Mother 24       s/p partial mastectomy  . Skin cancer Mother        on right knee; unspecified type  . Colon polyps Mother        three total  . Other Mother 43       history of a TAH-BSO for bleeding  . Prostate cancer Maternal Uncle        (x2 maternal uncles)  . Cirrhosis Maternal Grandfather        +EtOH  . Cancer Brother 55       vocal cord ca; not a smoker  . Autism Brother   . Cancer Maternal Uncle        vocal cord ca; +smoker  . Ovarian cancer Maternal Aunt        dx. >50  . Cancer Maternal Aunt        unspecified type  . Diabetes Maternal Aunt   . Colon cancer Cousin        (x2 maternal first cousins--sisters)  . Breast cancer Cousin        dx. 12 or younger  . Cancer Other        NOS cancer; lim info; all three of his daughters had cancer as well  . Breast cancer Other     Social History Social History  Substance Use Topics  . Smoking status: Former Smoker    Packs/day: 0.25    Types: Cigarettes    Quit date: 12/2015  . Smokeless tobacco: Never Used  . Alcohol use Yes     Comment: "only on special occasions"      Allergies   Patient has no known allergies.   Review of Systems Review of Systems  All other systems reviewed and are negative.    Physical Exam Updated Vital Signs BP 94/60 (BP Location: Left Arm)   Pulse 76   Temp 98 F (36.7 C) (Oral)   Resp 20   SpO2 96%   Physical Exam  Constitutional: She appears well-developed and well-nourished. No distress.  HENT:  Head: Normocephalic and atraumatic.  Right Ear: External ear normal.  Left Ear: External ear normal.  Eyes: Conjunctivae are normal. Right eye exhibits no discharge. Left eye exhibits no discharge. No scleral icterus.  Neck: Neck supple. No tracheal deviation present.  Cardiovascular: Normal rate, regular rhythm and  intact distal pulses.   Pulmonary/Chest: Effort normal and breath sounds normal. No stridor. No respiratory distress. She has no wheezes. She has no rales.  Abdominal: Soft. Bowel sounds are normal. She exhibits no distension and no mass. There is tenderness ( mild, llq and left suprapubic region). There is no rebound and no guarding. No hernia.  Healing laparoscopy scars, no erythema or drainage, no focal tenderness, no masses palpated on exam, no  hernia.  Musculoskeletal: She exhibits no edema or tenderness.  Neurological: She is alert. She has normal strength. No cranial nerve deficit (no facial droop, extraocular movements intact, no slurred speech) or sensory deficit. She exhibits normal muscle tone. She displays no seizure activity. Coordination normal.  Skin: Skin is warm and dry. No rash noted.  Psychiatric: She has a normal mood and affect.  Nursing note and vitals reviewed.    ED Treatments / Results  Labs (all labs ordered are listed, but only abnormal results are displayed) Labs Reviewed  COMPREHENSIVE METABOLIC PANEL - Abnormal; Notable for the following:       Result Value   Glucose, Bld 106 (*)    All other components within normal limits  CBC WITH DIFFERENTIAL/PLATELET - Abnormal; Notable for the following:    RBC 3.48 (*)    Hemoglobin 9.9 (*)    HCT 29.7 (*)    All other components within normal limits  CULTURE, BLOOD (ROUTINE X 2)  CULTURE, BLOOD (ROUTINE X 2)  LIPASE, BLOOD  URINALYSIS, ROUTINE W REFLEX MICROSCOPIC    Radiology Ct Abdomen Pelvis W Contrast  Result Date: 06/14/2017 CLINICAL DATA:  Pt from [facility] family care, arrived via [facility]. Pt had partial hysterectomy 2 weeks ago, c/o lower abd pain, onset today, 9/10. Denies N/V. [facility] reports palpable mass to LLQ which is tender to touch. EXAM: CT ABDOMEN AND PELVIS WITH CONTRAST TECHNIQUE: Multidetector CT imaging of the abdomen and pelvis was performed using the standard protocol following bolus administration  of intravenous contrast. CONTRAST:  167mL ISOVUE-300 IOPAMIDOL (ISOVUE-300) INJECTION 61% COMPARISON:  Ultrasound the abdomen 03/21/2017 and CT abdomen and pelvis 12/28/2007 FINDINGS: Lower chest: Heart size is normal. No imaged pericardial effusion or significant coronary artery calcifications. No pulmonary nodules, pleural effusions, or infiltrates. Hepatobiliary: Status post cholecystectomy. The liver is normal in appearance. Pancreas: Unremarkable. No pancreatic ductal dilatation or surrounding inflammatory changes. Spleen: Normal in size without focal abnormality. Adrenals/Urinary Tract: The adrenal glands are normal in appearance. There is mild prominence of the collecting system and ureters bilaterally without evidence for urinary tract obstruction. No renal mass. Stomach/Bowel: The stomach and small bowel loops are are normal in appearance. The appendix is well seen and has a normal appearance. Colon is normal in appearance. Vascular/Lymphatic: Unremarkable. Reproductive: Status post hysterectomy. The ovaries are not well seen. Other: Within the left adnexal region, there is an irregular collection with rim enhancement measuring 4.6 x 10.0 x 3.6 cm, consistent with postoperative seroma, hematoma, or abscess. This collection is contiguous with a fluid collection in the cul-de-sac which measures 5.8 by 4.5 cm. Musculoskeletal: There are degenerative changes in the lower thoracic and lumbar spine. Degenerative changes are seen in both hips. IMPRESSION: 1. Left adnexal seroma, hematoma, or abscess contiguous with a fluid collection in the cul-de-sac. 2. Status post hysterectomy. 3. Mild bilateral hydronephrosis without evidence for direct involvement of either ureter. 4. Status post cholecystectomy. 5. Normal appendix. Electronically Signed   By: Nolon Nations M.D.   On: 06/14/2017 12:45    Procedures Procedures (including critical care time)  Medications Ordered in ED Medications  sodium chloride 0.9  % bolus 1,000 mL (0 mLs Intravenous Stopped 06/14/17 1219)    And  0.9 %  sodium chloride infusion ( Intravenous Transfusing/Transfer 06/14/17 1558)  HYDROmorphone (DILAUDID) injection 0.5 mg (0.5 mg Intravenous Given 06/14/17 1120)  iopamidol (ISOVUE-300) 61 % injection (not administered)  ondansetron (ZOFRAN) injection 4 mg (4 mg Intravenous Given 06/14/17 1119)  iopamidol (ISOVUE-300)  61 % injection 100 mL (100 mLs Intravenous Contrast Given 06/14/17 1213)  morphine 2 MG/ML injection 4 mg (4 mg Intravenous Given 06/14/17 1332)  ketorolac (TORADOL) 30 MG/ML injection 15 mg (15 mg Intravenous Given 06/14/17 1332)  piperacillin-tazobactam (ZOSYN) IVPB 3.375 g (3.375 g Intravenous Transfusing/Transfer 06/14/17 1558)     Initial Impression / Assessment and Plan / ED Course  I have reviewed the triage vital signs and the nursing notes.  Pertinent labs & imaging results that were available during my care of the patient were reviewed by me and considered in my medical decision making (see chart for details).  Clinical Course as of Jun 14 1558  Wed Jun 14, 2017  1347 Pt developed recurrent severe abdominal pain.  She states it hurts to urinate. WIll give a dose of morphine and a dose of toradol.  Discussed the findings on CT scan with the patient.   [JK]  1423 I updated pt on the CT scan results.  I spoke with Dr Delsa Sale.  Will transfer to [facility] for admission.  May need IR drainage.  [WK]  4628 I spoke with pt's mother over the telephone at [facility] request.  I updated her on the findings and the plan.    [MN]  8177 Notified that patient had 1000 cc of urine on the bladder scan.  Surprising as she had no urinary retention on the CT scan.  Foley catheter ordered  [JK]    Clinical Course User Index [JK] Dorie Rank, MD    Patient presented to the emergency room with severe colicky abdominal pain. The patient had a recent bilateral salpingo-oophorectomy performed on June 21. Patient states she  developed acute onset of lower abdominal pain primarily in the left lower quadrant and flank. Patient also had the sense of urinary urgency. Initial laboratory tests are reassuring. Symptoms were concerning for the possibility of renal colic or some complication associated with recent surgery.  CT scan demonstrated a large fluid collection in the cul-de-sac.  Patient denies any fevers or chills. Doubt infection and fever seroma.  Pt is having persistent pain.  Will continue with pain management.  Transfer to [facility] hospital for admission.  Dr Delsa Sale is accepting.  Final Clinical Impressions(s) / ED Diagnoses   Final diagnoses:  Postoperative hematoma involving genitourinary system following genitourinary procedure  Urinary retention     Dorie Rank, MD 06/14/17 1429    Dorie Rank, MD 06/14/17 [phone]

## 2017-06-15 ENCOUNTER — Inpatient Hospital Stay (HOSPITAL_COMMUNITY): Payer: Medicaid Other

## 2017-06-15 ENCOUNTER — Ambulatory Visit: Payer: Medicaid Other | Admitting: Gynecologic Oncology

## 2017-06-15 DIAGNOSIS — R339 Retention of urine, unspecified: Secondary | ICD-10-CM

## 2017-06-15 DIAGNOSIS — Z90722 Acquired absence of ovaries, bilateral: Secondary | ICD-10-CM

## 2017-06-15 DIAGNOSIS — Z1502 Genetic susceptibility to malignant neoplasm of ovary: Secondary | ICD-10-CM

## 2017-06-15 LAB — GLUCOSE, CAPILLARY
Glucose-Capillary: 116 mg/dL — ABNORMAL HIGH (ref 65–99)
Glucose-Capillary: 97 mg/dL (ref 65–99)
Glucose-Capillary: 87 mg/dL (ref 65–99)
Glucose-Capillary: 159 mg/dL — ABNORMAL HIGH (ref 65–99)

## 2017-06-15 LAB — CREATININE, FLUID (PLEURAL, PERITONEAL, JP DRAINAGE): Creat, Fluid: 0.6 mg/dL

## 2017-06-15 LAB — PROTIME-INR
INR: 0.99
Prothrombin Time: 13.1 seconds (ref 11.4–15.2)

## 2017-06-15 MED ORDER — HYDROMORPHONE HCL-NACL 0.5-0.9 MG/ML-% IV SOSY: 0.2000 mg | PREFILLED_SYRINGE | INTRAVENOUS | Status: DC | PRN

## 2017-06-15 MED ORDER — FENTANYL CITRATE (PF) 100 MCG/2ML IJ SOLN
INTRAMUSCULAR | Status: AC | PRN
  Administered 2017-06-15 (×2): 50 ug via INTRAVENOUS

## 2017-06-15 MED ORDER — FENTANYL CITRATE (PF) 100 MCG/2ML IJ SOLN
INTRAMUSCULAR | Status: AC
  Filled 2017-06-15: qty 2

## 2017-06-15 MED ORDER — LIDOCAINE HCL 1 % IJ SOLN
INTRAMUSCULAR | Status: AC | PRN
  Administered 2017-06-15: 10 mL

## 2017-06-15 MED ORDER — MIDAZOLAM HCL 2 MG/2ML IJ SOLN
INTRAMUSCULAR | Status: AC | PRN
  Administered 2017-06-15 (×2): 1 mg via INTRAVENOUS

## 2017-06-15 MED ORDER — MIDAZOLAM HCL 2 MG/2ML IJ SOLN
INTRAMUSCULAR | Status: AC
  Filled 2017-06-15: qty 4

## 2017-06-15 NOTE — Consult Note (Signed)
Chief Complaint: Patient was seen in consultation today for CT-guided aspiration/drainage of pelvic fluid collection Chief Complaint  Patient presents with  . Abdominal Pain     Referring Physician(s): [place]  Supervising Physician: Jacqulynn Cadet  Patient Status: [facility] - In-pt  History of Present Illness: Alison [name] is a 56 y.o. female with history of depression, diabetes, GERD, hyperlipidemia, hypertension, schizophrenia, right breast cancer 2016, status post lumpectomy with chemoradiation. She is status post elective robotic assisted BSO on 06/01/17. She was admitted on 7/4 with worsening left lower quadrant abdominal pain as well as urinary urgency/dysuria. She is afebrile with a normal WBC. Subsequent imaging revealed a left adnexal seroma, hematoma or abscess contiguous with fluid collection in the cul-de-sac. Request now received for CT-guided aspiration/drainage of the pelvic fluid collection.  Past Medical History:  Diagnosis Date  . Anemia   . Cancer ([place])   . Depression    chronic   . Diabetes mellitus without complication ([place])    type II  . Gallstones   . GERD (gastroesophageal reflux disease)   . History of radiation therapy 07/25/16- 09/05/16   Right Breast  . Hyperlipidemia   . Hypertension   . Schizophrenia [facility])     Past Surgical History:  Procedure Laterality Date  . ABDOMINAL HYSTERECTOMY    . BREAST LUMPECTOMY Right 2017  . BREAST LUMPECTOMY WITH RADIOACTIVE SEED AND SENTINEL LYMPH NODE BIOPSY Right 01/20/2016   Procedure: RIGHT BREAST LUMPECTOMY WITH RADIOACTIVE SEED AND SENTINEL LYMPH NODE BIOPSY;  Surgeon: Coralie Keens, MD;  Location: [place];  Service: General;  Laterality: Right;  . CHOLECYSTECTOMY N/A 04/16/2014   Procedure: LAPAROSCOPIC CHOLECYSTECTOMY ;  Surgeon: [name]. Georgette Dover, MD;  [facility];  Service: General;  Laterality: N/A;  . DEBULKING N/A 06/01/2017   Procedure: POSSIBLE DEBULKING;  Surgeon:  Janie Morning, MD;  [facility];  Service: Gynecology;  Laterality: N/A;  . PORT-A-CATH REMOVAL Left 10/13/2016   Procedure: REMOVAL PORT-A-CATH;  Surgeon: Coralie Keens, MD;  [facility];  Service: General;  Laterality: Left;  . PORTACATH PLACEMENT Left 01/20/2016   Procedure: INSERTION PORT-A-CATH;  Surgeon: Coralie Keens, MD;  Location: [place];  Service: General;  Laterality: Left;  . ROBOTIC ASSISTED BILATERAL SALPINGO OOPHERECTOMY N/A 06/01/2017   Procedure: XI ROBOTIC ASSISTED BILATERAL SALPINGO OOPHORECTOMY AND PELVIC WASHINGS;  Surgeon: Janie Morning, MD;  [facility];  Service: Gynecology;  Laterality: N/A;    Allergies: Patient has no known allergies.  Medications: Prior to Admission medications   Medication Sig Start Date End Date Taking? Authorizing Provider  benztropine (COGENTIN) 0.5 MG tablet Take 0.5 mg by mouth 2 (two) times daily.   Yes [provider]  carvedilol (COREG) 3.125 MG tablet TAKE 1 TABLET BY MOUTH TWICE DAILY. 08/17/16  Yes Larey Dresser, MD  cromolyn (OPTICROM) 4 % ophthalmic solution Place 1 drop into both eyes 4 (four) times daily.   Yes [provider]  cycloSPORINE (RESTASIS) 0.05 % ophthalmic emulsion Place 1 drop into both eyes 2 (two) times daily.   Yes [provider]  divalproex (DEPAKOTE ER) 500 MG 24 hr tablet Take 1,000 mg by mouth at bedtime.   Yes [provider]  furosemide (LASIX) 40 MG tablet Take 40 mg by mouth every morning.   Yes [provider]  lisinopril (PRINIVIL,ZESTRIL) 10 MG tablet Take 10 mg by mouth every morning.    Yes [provider]  metFORMIN (GLUCOPHAGE) 500 MG tablet  Take 500 mg by mouth 2 (two) times daily with a meal.    Yes [provider]  OLANZapine (ZYPREXA) 10 MG tablet Take 10 mg by mouth at bedtime.   Yes [provider]  omeprazole (PRILOSEC) 20 MG capsule Take 40 mg by mouth every morning.   Yes  [provider]  oxyCODONE-acetaminophen (PERCOCET/ROXICET) 5-325 MG tablet Take 1-2 tablets by mouth every 4 (four) hours as needed for severe pain. 06/01/17  Yes Lahoma Crocker, MD  simvastatin (ZOCOR) 10 MG tablet Take 10 mg by mouth at bedtime.   Yes [provider]     Family History  Problem Relation Age of Onset  . Breast cancer Mother 72       s/p partial mastectomy  . Skin cancer Mother        on right knee; unspecified type  . Colon polyps Mother        three total  . Other Mother 16       history of a TAH-BSO for bleeding  . Prostate cancer Maternal Uncle        (x2 maternal uncles)  . Cirrhosis Maternal Grandfather        +EtOH  . Cancer Brother 55       vocal cord ca; not a smoker  . Autism Brother   . Cancer Maternal Uncle        vocal cord ca; +smoker  . Ovarian cancer Maternal Aunt        dx. >50  . Cancer Maternal Aunt        unspecified type  . Diabetes Maternal Aunt   . Colon cancer Cousin        (x2 maternal first cousins--sisters)  . Breast cancer Cousin        dx. 83 or younger  . Cancer Other        NOS cancer; lim info; all three of his daughters had cancer as well  . Breast cancer Other     Social History   Social History  . Marital status: Single    Spouse name: N/A  . Number of children: N/A  . Years of education: N/A   Social History Main Topics  . Smoking status: Former Smoker    Packs/day: 0.25    Types: Cigarettes    Quit date: 12/2015  . Smokeless tobacco: Never Used  . Alcohol use Yes     Comment: "only on special occasions"   . Drug use: No  . Sexual activity: Yes    Birth control/ protection: Surgical   Other Topics Concern  . None   Social History Narrative  . None     Review of Systems see above; denies fever, headache, chest pain, dyspnea, cough, pain, nausea, vomiting or abnormal bleeding.  Vital Signs: BP 110/64 (BP Location: Left Arm)   Pulse 79   Temp 98.2 F (36.8 C) (Oral)   Resp  16   SpO2 95%   Physical Exam awake, alert. Chest clear to auscultation bilaterally. Heart with regular rate and rhythm. Abdomen soft, positive bowel sounds, tender pelvic region, more so on the left. No significant lower extremity edema.  Mallampati Score:     Imaging: Ct Abdomen Pelvis W Contrast  Result Date: 06/14/2017 CLINICAL DATA:  Pt from [facility] family care, arrived via [facility]. Pt had partial hysterectomy 2 weeks ago, c/o lower abd pain, onset today, 9/10. Denies N/V. [facility] reports palpable mass to LLQ which is tender to touch. EXAM: CT  ABDOMEN AND PELVIS WITH CONTRAST TECHNIQUE: Multidetector CT imaging of the abdomen and pelvis was performed using the standard protocol following bolus administration of intravenous contrast. CONTRAST:  176mL ISOVUE-300 IOPAMIDOL (ISOVUE-300) INJECTION 61% COMPARISON:  Ultrasound the abdomen 03/21/2017 and CT abdomen and pelvis 12/28/2007 FINDINGS: Lower chest: Heart size is normal. No imaged pericardial effusion or significant coronary artery calcifications. No pulmonary nodules, pleural effusions, or infiltrates. Hepatobiliary: Status post cholecystectomy. The liver is normal in appearance. Pancreas: Unremarkable. No pancreatic ductal dilatation or surrounding inflammatory changes. Spleen: Normal in size without focal abnormality. Adrenals/Urinary Tract: The adrenal glands are normal in appearance. There is mild prominence of the collecting system and ureters bilaterally without evidence for urinary tract obstruction. No renal mass. Stomach/Bowel: The stomach and small bowel loops are are normal in appearance. The appendix is well seen and has a normal appearance. Colon is normal in appearance. Vascular/Lymphatic: Unremarkable. Reproductive: Status post hysterectomy. The ovaries are not well seen. Other: Within the left adnexal region, there is an irregular collection with rim enhancement measuring 4.6 x 10.0 x 3.6 cm, consistent with postoperative seroma, hematoma,  or abscess. This collection is contiguous with a fluid collection in the cul-de-sac which measures 5.8 by 4.5 cm. Musculoskeletal: There are degenerative changes in the lower thoracic and lumbar spine. Degenerative changes are seen in both hips. IMPRESSION: 1. Left adnexal seroma, hematoma, or abscess contiguous with a fluid collection in the cul-de-sac. 2. Status post hysterectomy. 3. Mild bilateral hydronephrosis without evidence for direct involvement of either ureter. 4. Status post cholecystectomy. 5. Normal appendix. Electronically Signed   By: Nolon Nations M.D.   On: 06/14/2017 12:45    Labs:  CBC:  Recent Labs  06/30/16 0941 09/23/16 1000 05/25/17 1400 06/14/17 1113  WBC 3.0* 3.0* 4.0 5.6  HGB 10.3* 11.4* 11.2* 9.9*  HCT 31.1* 34.2* 33.3* 29.7*  PLT 212 176 205 327    COAGS:  Recent Labs  06/15/17 0445  INR 0.99    BMP:  Recent Labs  06/30/16 0941 09/23/16 1000 05/25/17 1400 06/14/17 1113  NA 142 143 143 140  K 4.2 4.2 3.8 3.9  CL  --   --  104 103  CO2 27 28 29 26   GLUCOSE 156* 120 148* 106*  BUN 13.6 11.5 12 10   CALCIUM 9.1 9.3 9.0 8.9  CREATININE 0.9 0.8 0.80 0.68  GFRNONAA  --   --  >60 >60  GFRAA  --   --  >60 >60    LIVER FUNCTION TESTS:  Recent Labs  06/30/16 0941 09/23/16 1000 05/25/17 1400 06/14/17 1113  BILITOT <0.30 0.25 0.3 0.5  AST 14 20 28 23   ALT 12 16 33 23  ALKPHOS 75 88 70 87  PROT 7.2 7.4 7.0 7.5  ALBUMIN 3.6 3.6 3.8 3.8    TUMOR MARKERS: No results for input(s): AFPTM, CEA, CA199, CHROMGRNA in the last 8760 hours.  Assessment and Plan: 56 y.o. female with history of depression, diabetes, GERD, hyperlipidemia, hypertension, schizophrenia, right breast cancer 2016, status post lumpectomy with chemoradiation. She is status post elective robotic assisted BSO on 06/01/17. She was admitted on 7/4 with worsening left lower quadrant abdominal pain as well as urinary urgency/dysuria. UA was neg.She is afebrile with a normal WBC.  Subsequent imaging revealed a left adnexal seroma, hematoma or abscess contiguous with fluid collection in the cul-de-sac. Request now received for CT-guided aspiration/drainage of the pelvic fluid collection. Imaging studies have been reviewed by Dr. Laurence Ferrari.Risks and benefits discussed with the  patient including bleeding, infection, damage to adjacent structures, bowel perforation/fistula connection, and sepsis.All of the patient's questions were answered, patient is agreeable to proceed.Consent signed and in chart. Procedure scheduled for this afternoon.     Thank you for this interesting consult.  I greatly enjoyed meeting Alison [name] and look forward to participating in their care.  A copy of this report was sent to the requesting provider on this date.  Electronically Signed: D. Rowe Robert, PA-C 06/15/2017, 10:38 AM   I spent a total of 30 minutes    in face to face in clinical consultation, greater than 50% of which was counseling/coordinating care for CT-guided aspiration/drainage of pelvic fluid collection

## 2017-06-15 NOTE — Progress Notes (Signed)
GYN ONCOLOGY  Alison [name]  POD 15 S/P robotic risk reducing BSO for a deleterious BRCA2 mutation..  Presented to ER with c/o pelvic discomfort and urinary retention.  Alison [name] is comfortable.    O BP 130/74 (BP Location: Left Arm)   Pulse 79   Temp 98.6 F (37 C) (Oral)   Resp 16   Ht _0  (1.549 m)   Wt 171 lb 15.3 oz (78 kg)   SpO2 99%   BMI 32.49 kg/m   WD female YJE:HUDJ, minimal discomfort GU  Foley catheter in place.  Plan:  VIR guided drainage of pelvic fluid collection.  If clear request that the specimen be sent to check creatinine.  Double infections process.  WBC wnl. Remove foley with voiding trial after VIR drain in place. Follow-up with Dr. Skeet Latch    Scheduled Meds: . benztropine  0.5 mg Oral BID  . carvedilol  3.125 mg Oral BID WC  . cromolyn  1 drop Both Eyes QID  . cycloSPORINE  1 drop Both Eyes BID  . divalproex  1,000 mg Oral QHS  . insulin aspart  0-15 Units Subcutaneous TID WC  . insulin aspart  0-5 Units Subcutaneous QHS  . lisinopril  10 mg Oral q morning - 10a  . OLANZapine  10 mg Oral QHS  . pantoprazole  40 mg Oral Daily  . simvastatin  10 mg Oral QHS   Continuous Infusions: . sodium chloride 50 mL/hr at 06/14/17 1828  . sodium chloride    . piperacillin-tazobactam Stopped (06/15/17 1310)   PRN Meds:.HYDROmorphone (DILAUDID) injection, ibuprofen, morphine injection, ondansetron **OR** ondansetron (ZOFRAN) IV, oxyCODONE-acetaminophen, simethicone  Labs CT abd/pelvis  Within the left adnexal region, there is an irregular collection with rim enhancement measuring 4.6 x 10.0 x 3.6 cm, consistent with postoperative seroma, hematoma, or abscess. This collection is contiguous with a fluid collection in the cul-de-sac which measures 5.8 by 4.5 cm.  CBC    Component Value Date/Time   WBC 5.6 06/14/2017 1113   RBC 3.48 (L) 06/14/2017 1113   HGB 9.9 (L) 06/14/2017 1113   HGB 11.4 (L) 09/23/2016 1000   HCT 29.7 (L) 06/14/2017  1113   HCT 34.2 (L) 09/23/2016 1000   PLT 327 06/14/2017 1113   PLT 176 09/23/2016 1000   MCV 85.3 06/14/2017 1113   MCV 85.1 09/23/2016 1000   MCH 28.4 06/14/2017 1113   MCHC 33.3 06/14/2017 1113   RDW 13.1 06/14/2017 1113   RDW 13.6 09/23/2016 1000   LYMPHSABS 1.4 06/14/2017 1113   LYMPHSABS 0.8 (L) 09/23/2016 1000   MONOABS 0.4 06/14/2017 1113   MONOABS 0.3 09/23/2016 1000   EOSABS 0.1 06/14/2017 1113   EOSABS 0.1 09/23/2016 1000   BASOSABS 0.0 06/14/2017 1113   BASOSABS 0.0 09/23/2016 1000

## 2017-06-15 NOTE — Procedures (Signed)
Interventional Radiology Procedure Note  Procedure: Placement of a 66F drain modified with additional sideholes into the LLQ and pelvic fluid collection.  Aspiration yields dark red-brown fluid most c/w liquefied hematoma .    Complications: None  Estimated Blood Loss: < 25 mL  Recommendations: - Samples sent for Cx and Cr  Signed,  Criselda Peaches, MD

## 2017-06-16 LAB — GLUCOSE, CAPILLARY
Glucose-Capillary: 120 mg/dL — ABNORMAL HIGH (ref 65–99)
Glucose-Capillary: 187 mg/dL — ABNORMAL HIGH (ref 65–99)

## 2017-06-16 MED ORDER — METFORMIN HCL 500 MG PO TABS: 500.0000 mg | ORAL_TABLET | Freq: Two times a day (BID) | ORAL | Status: DC

## 2017-06-16 MED ORDER — AMOXICILLIN-POT CLAVULANATE 875-125 MG PO TABS: 1.0000 | ORAL_TABLET | Freq: Two times a day (BID) | ORAL | 0 refills | Status: DC

## 2017-06-16 NOTE — Progress Notes (Signed)
CSW attempted several times to contact [place] at [facility] to provide requested FL2. CSW met with patient and RN at bedside. Patient mother will transport patient back to Group Home.    Kathrin Greathouse, Latanya Presser, MSW Clinical Social Worker 5E and Psychiatric Service Line [phone] 06/16/2017  1:26 PM

## 2017-06-16 NOTE — Progress Notes (Signed)
Referring Physician(s): Teodoro Spray  Supervising Physician: Markus Daft  Patient Status:  [facility] - In-pt  Chief Complaint:  Pelvic fluid collection/hematoma  Subjective: Pt states she feels better this am; has less LLQ pain; denies N/V; "hungry"   Allergies: Patient has no known allergies.  Medications: Prior to Admission medications   Medication Sig Start Date End Date Taking? Authorizing Provider  benztropine (COGENTIN) 0.5 MG tablet Take 0.5 mg by mouth 2 (two) times daily.   Yes [provider]  carvedilol (COREG) 3.125 MG tablet TAKE 1 TABLET BY MOUTH TWICE DAILY. 08/17/16  Yes Larey Dresser, MD  cromolyn (OPTICROM) 4 % ophthalmic solution Place 1 drop into both eyes 4 (four) times daily.   Yes [provider]  cycloSPORINE (RESTASIS) 0.05 % ophthalmic emulsion Place 1 drop into both eyes 2 (two) times daily.   Yes [provider]  divalproex (DEPAKOTE ER) 500 MG 24 hr tablet Take 1,000 mg by mouth at bedtime.   Yes [provider]  furosemide (LASIX) 40 MG tablet Take 40 mg by mouth every morning.   Yes [provider]  lisinopril (PRINIVIL,ZESTRIL) 10 MG tablet Take 10 mg by mouth every morning.    Yes [provider]  metFORMIN (GLUCOPHAGE) 500 MG tablet Take 500 mg by mouth 2 (two) times daily with a meal.    Yes [provider]  OLANZapine (ZYPREXA) 10 MG tablet Take 10 mg by mouth at bedtime.   Yes [provider]  omeprazole (PRILOSEC) 20 MG capsule Take 40 mg by mouth every morning.   Yes [provider]  oxyCODONE-acetaminophen (PERCOCET/ROXICET) 5-325 MG tablet Take 1-2 tablets by mouth every 4 (four) hours as needed for severe pain. 06/01/17  Yes Lahoma Crocker, MD  simvastatin (ZOCOR) 10 MG tablet Take 10 mg by mouth at bedtime.   Yes [provider]     Vital Signs: BP 120/72 (BP Location: Right Arm)   Pulse 75   Temp 98.6 F (37 C) (Oral)   Resp 14   Ht 5'  1" (1.549 m)   Wt 171 lb 15.3 oz (78 kg)   SpO2 100%   BMI 32.49 kg/m   Physical Exam LLQ drain intact, dressing dry, output about 5-10 cc dark bloody fluid in JP bulb; drain flushed with 5 cc sterile NS  Imaging: Ct Abdomen Pelvis W Contrast  Result Date: 06/14/2017 CLINICAL DATA:  Pt from [facility] family care, arrived via [facility]. Pt had partial hysterectomy 2 weeks ago, c/o lower abd pain, onset today, 9/10. Denies N/V. [facility] reports palpable mass to LLQ which is tender to touch. EXAM: CT ABDOMEN AND PELVIS WITH CONTRAST TECHNIQUE: Multidetector CT imaging of the abdomen and pelvis was performed using the standard protocol following bolus administration of intravenous contrast. CONTRAST:  152mL ISOVUE-300 IOPAMIDOL (ISOVUE-300) INJECTION 61% COMPARISON:  Ultrasound the abdomen 03/21/2017 and CT abdomen and pelvis 12/28/2007 FINDINGS: Lower chest: Heart size is normal. No imaged pericardial effusion or significant coronary artery calcifications. No pulmonary nodules, pleural effusions, or infiltrates. Hepatobiliary: Status post cholecystectomy. The liver is normal in appearance. Pancreas: Unremarkable. No pancreatic ductal dilatation or surrounding inflammatory changes. Spleen: Normal in size without focal abnormality. Adrenals/Urinary Tract: The adrenal glands are normal in appearance. There is mild prominence of the collecting system and ureters bilaterally without evidence for urinary tract obstruction. No renal mass. Stomach/Bowel: The stomach and small bowel loops are are normal in appearance. The appendix is well seen and has a normal appearance. Colon  is normal in appearance. Vascular/Lymphatic: Unremarkable. Reproductive: Status post hysterectomy. The ovaries are not well seen. Other: Within the left adnexal region, there is an irregular collection with rim enhancement measuring 4.6 x 10.0 x 3.6 cm, consistent with postoperative seroma, hematoma, or abscess. This collection is contiguous with a fluid  collection in the cul-de-sac which measures 5.8 by 4.5 cm. Musculoskeletal: There are degenerative changes in the lower thoracic and lumbar spine. Degenerative changes are seen in both hips. IMPRESSION: 1. Left adnexal seroma, hematoma, or abscess contiguous with a fluid collection in the cul-de-sac. 2. Status post hysterectomy. 3. Mild bilateral hydronephrosis without evidence for direct involvement of either ureter. 4. Status post cholecystectomy. 5. Normal appendix. Electronically Signed   By: Nolon Nations M.D.   On: 06/14/2017 12:45   Ct Image Guided Drainage By Percutaneous Catheter  Result Date: 06/16/2017 INDICATION: 56 year old female with a postoperative pelvic fluid collection in the anatomic pelvis. Differential considerations include hematoma, abscess and urinoma. She presents for CT-guided drain placement. EXAM: CT GUIDED DRAINAGE OF pelvic ABSCESS MEDICATIONS: The patient is currently admitted to the hospital and receiving intravenous antibiotics. The antibiotics were administered within an appropriate time frame prior to the initiation of the procedure. ANESTHESIA/SEDATION: 2 mg IV Versed 100 mcg IV Fentanyl Moderate Sedation Time:  24 minutes The patient was continuously monitored during the procedure by the interventional radiology nurse under my direct supervision. COMPLICATIONS: None immediate. TECHNIQUE: Informed written consent was obtained from the patient after a thorough discussion of the procedural risks, benefits and alternatives. All questions were addressed. Maximal Sterile Barrier Technique was utilized including caps, mask, sterile gowns, sterile gloves, sterile drape, hand hygiene and skin antiseptic. A timeout was performed prior to the initiation of the procedure. PROCEDURE: The operative field was prepped with Chlorhexidine in a sterile fashion, and a sterile drape was applied covering the operative field. A sterile gown and sterile gloves were used for the procedure. Local  anesthesia was provided with 1% Lidocaine. A planning axial CT scan was performed. A suitable skin entry site was selected and marked. Local anesthesia was attained by infiltration with 1% lidocaine. A small dermatotomy was made. Under intermittent CT guidance, an 18 gauge trocar needle was advanced of the complex fluid collection in the left anatomic pelvis. A 0.035 wire was then advanced inferiorly into the pelvic cul-de-sac. The skin tract was serially dilated to 12 [place] and a Cook 12 [place] all-purpose drainage catheter modified with additional sideholes was advanced over the wire and formed. Aspiration yields approximately 60 mL of the thin reddish brown fluid. The drain was secured to the skin with 0 Prolene suture. Follow-up axial CT imaging demonstrates a well-positioned drainage catheter with significant improvement in the size of the complex fluid collection. No evidence of immediate complication. FINDINGS: 80 mL thin reddish brown fluid most consistent with liquified hematoma. IMPRESSION: Successful placement of a 57 French all-purpose drainage catheter modified with additional sideholes into the complex pelvic fluid collection via a left lower quadrant anterolateral approach. Aspiration yields 80 mL of liquified hematoma. Samples were sent for culture and creatinine as requested. PLAN: Maintain tube to JP bulb suction and flush 2-3 times daily. When output has been less than 10 mL for 2 days consecutively, recommend repeat CT scan of the pelvis with intravenous contrast prior to drain removal. Signed, Criselda Peaches, MD [facility] Radiology Electronically Signed   By: Jacqulynn Cadet M.D.   On: 06/16/2017 08:49    Labs:  CBC:  Recent Labs  06/30/16 0941 09/23/16 1000 05/25/17 1400 06/14/17 1113  WBC 3.0* 3.0* 4.0 5.6  HGB 10.3* 11.4* 11.2* 9.9*  HCT 31.1* 34.2* 33.3* 29.7*  PLT 212 176 205 327    COAGS:  Recent Labs   06/15/17 0445  INR 0.99    BMP:  Recent Labs  06/30/16 0941 09/23/16 1000 05/25/17 1400 06/14/17 1113  NA 142 143 143 140  K 4.2 4.2 3.8 3.9  CL  --   --  104 103  CO2 27 28 29 26   GLUCOSE 156* 120 148* 106*  BUN 13.6 11.5 12 10   CALCIUM 9.1 9.3 9.0 8.9  CREATININE 0.9 0.8 0.80 0.68  GFRNONAA  --   --  >60 >60  GFRAA  --   --  >60 >60    LIVER FUNCTION TESTS:  Recent Labs  06/30/16 0941 09/23/16 1000 05/25/17 1400 06/14/17 1113  BILITOT <0.30 0.25 0.3 0.5  AST 14 20 28 23   ALT 12 16 33 23  ALKPHOS 75 88 70 87  PROT 7.2 7.4 7.0 7.5  ALBUMIN 3.6 3.6 3.8 3.8    Assessment and Plan: S/p BSO 06/01/17 with post op left pelvic fluid collection/hematoma; s/p left pelvic drain placement 7/5; AF; fluid creat 0.6 (not urinoma); fluid cx pend; no new labs today; as per Dr. Katrinka Blazing note, cont drain flushes tid while inhouse, once daily as OP and record output- once output less than 10 cc for 2 consecutive days obtain f/u CT pelvis with IV contrast to reevaluate site. If pt d/c'd home with drain can f/u at IR drain clinic [phone]).   Electronically Signed: D. Rowe Robert, PA-C 06/16/2017, 9:57 AM   I spent a total of 15 minutes at the the patient's bedside AND on the patient's hospital floor or unit, greater than 50% of which was counseling/coordinating care for pelvic fluid collection drain    Patient ID: Alison [name], female   DOB: [birth year], 56 y.o.   MRN: [id number]

## 2017-06-16 NOTE — Discharge Instructions (Signed)
How to care for your surgical drain  Keep the skin around the drain dry and covered with a dressing at all times.  Check your drain area every day for signs of infection. Check for: ? More redness, swelling, or pain. ? Pus or a bad smell. ? Cloudy drainage. Follow instructions from your health care provider about how to take care of your drain and how to change your dressing. Change your dressing at least one time every day. Change it more often if needed to keep the dressing dry. Make sure you: 1. Gather your supplies, including: ? Tape. ? Germ-free cleaning solution (sterile saline). ? Split gauze drain sponge: 4 x 4 inches (10 x 10 cm). ? Gauze square: 4 x 4 inches (10 x 10 cm). 2. Wash your hands with soap and water before you change your dressing. If soap and water are not available, use hand sanitizer. 3. Remove the old dressing. Avoid using scissors to do that. 4. Use sterile saline to clean your skin around the drain. 5. Place the tube through the slit in a drain sponge. Place the drain sponge so that it covers your wound. 6. Place the gauze square or another drain sponge on top of the drain sponge that is on the wound. Make sure the tube is between those layers. 7. Tape the dressing to your skin. 8. If you have an active bulb or Hemovac drain, tape the drainage tube to your skin 1-2 inches (2.5-5 cm) below the place where the tube enters your body. Taping keeps the tube from pulling on any stitches (sutures) that you have. 9. Wash your hands with soap and water. 10. Write down the color of your drainage and how often you change your dressing.  How to empty your active bulb or Hemovac drain 1. Make sure that you have a measuring cup that you can empty your drainage into. 2. Wash your hands with soap and water. If soap and water are not available, use hand sanitizer. 3. Gently move your fingers down the tube while squeezing very lightly. This is called stripping the tube. This  clears any drainage, clots, or tissue from the tube. ? Do not pull on the tube. ? You may need to strip the tube several times every day to keep the tube clear. 4. Open the bulb cap or the drain plug. Do not touch the inside of the cap or the bottom of the plug. 5. Empty all of the drainage into the measuring cup. 6. Compress the bulb or the container and replace the cap or the plug. To compress the bulb or the container, squeeze it firmly in the middle while you close the cap or plug the container. 7. Write down the amount of drainage that you have in each 24-hour period. If you have less than 2 Tbsp (30 mL) of drainage during 24 hours, contact your health care provider. 8. Flush the drainage down the toilet. 9. Wash your hands with soap and water. Contact a health care provider if:  You have more redness, swelling, or pain around your drain area.  The amount of drainage that you have is increasing instead of decreasing.  You have pus or a bad smell coming from your drain area.  You have a fever.  You have drainage that is cloudy.  There is a sudden stop or a sudden decrease in the amount of drainage that you have.  Your tube falls out.  Your active draindoes not stay compressedafter you  empty it. This information is not intended to replace advice given to you by your health care provider. Make sure you discuss any questions you have with your health care provider. Document Released: 11/25/2000 Document Revised: 05/05/2016 Document Reviewed: 06/17/2015 Elsevier Interactive Patient Education  2018 [employer].  Amoxicillin; Clavulanic Acid tablets What is this medicine? AMOXICILLIN; CLAVULANIC ACID (a mox i SIL in; KLAV yoo lan ic AS id) is a penicillin antibiotic. It is used to treat certain kinds of bacterial infections. It will not work for colds, flu, or other viral infections. This medicine may be used for other purposes; ask your health care provider or pharmacist if you  have questions. COMMON BRAND NAME(S): Augmentin What should I tell my health care provider before I take this medicine? They need to know if you have any of these conditions: -bowel disease, like colitis -kidney disease -liver disease -mononucleosis -an unusual or allergic reaction to amoxicillin, penicillin, cephalosporin, other antibiotics, clavulanic acid, other medicines, foods, dyes, or preservatives -pregnant or trying to get pregnant -breast-feeding How should I use this medicine? Take this medicine by mouth with a full glass of water. Follow the directions on the prescription label. Take at the start of a meal. Do not crush or chew. If the tablet has a score line, you may cut it in half at the score line for easier swallowing. Take your medicine at regular intervals. Do not take your medicine more often than directed. Take all of your medicine as directed even if you think you are better. Do not skip doses or stop your medicine early. Talk to your pediatrician regarding the use of this medicine in children. Special care may be needed. Overdosage: If you think you have taken too much of this medicine contact a poison control center or emergency room at once. NOTE: This medicine is only for you. Do not share this medicine with others. What if I miss a dose? If you miss a dose, take it as soon as you can. If it is almost time for your next dose, take only that dose. Do not take double or extra doses. What may interact with this medicine? -allopurinol -anticoagulants -birth control pills -methotrexate -probenecid This list may not describe all possible interactions. Give your health care provider a list of all the medicines, herbs, non-prescription drugs, or dietary supplements you use. Also tell them if you smoke, drink alcohol, or use illegal drugs. Some items may interact with your medicine. What should I watch for while using this medicine? Tell your doctor or health care  professional if your symptoms do not improve. Do not treat diarrhea with over the counter products. Contact your doctor if you have diarrhea that lasts more than 2 days or if it is severe and watery. If you have diabetes, you may get a false-positive result for sugar in your urine. Check with your doctor or health care professional. Birth control pills may not work properly while you are taking this medicine. Talk to your doctor about using an extra method of birth control. What side effects may I notice from receiving this medicine? Side effects that you should report to your doctor or health care professional as soon as possible: -allergic reactions like skin rash, itching or hives, swelling of the face, lips, or tongue -breathing problems -dark urine -fever or chills, sore throat -redness, blistering, peeling or loosening of the skin, including inside the mouth -seizures -trouble passing urine or change in the amount of urine -unusual bleeding, bruising -  unusually weak or tired -white patches or sores in the mouth or throat Side effects that usually do not require medical attention (report to your doctor or health care professional if they continue or are bothersome): -diarrhea -dizziness -headache -nausea, vomiting -stomach upset -vaginal or anal irritation This list may not describe all possible side effects. Call your doctor for medical advice about side effects. You may report side effects to FDA at 1-800-FDA-1088. Where should I keep my medicine? Keep out of the reach of children. Store at room temperature below 25 degrees C (77 degrees F). Keep container tightly closed. Throw away any unused medicine after the expiration date. NOTE: This sheet is a summary. It may not cover all possible information. If you have questions about this medicine, talk to your doctor, pharmacist, or health care provider.  2018 Elsevier/Gold Standard (2008-02-21 12:04:30)

## 2017-06-16 NOTE — Progress Notes (Addendum)
CSW spoke with patient about discharge plan. She provided CSW with group director and her mother information. CSW contacted [facility], she reports she is unsure if patient can return with drain. CSW faxed patient clinical information to area nursing homes.   After talking to her administrative RN, they feel patient can return if she has [facility] following.  CSW informed RN.   Kathrin Greathouse, Latanya Presser, MSW Clinical Social Worker 5E and Psychiatric Service Line [phone] 06/16/2017  10:52 AM

## 2017-06-16 NOTE — NC FL2 (Signed)
[place] MEDICAID FL2 LEVEL OF CARE SCREENING TOOL     IDENTIFICATION  Patient Name: Alison [name] Birthdate: 09/23/1961 Sex: female Admission Date (Current Location): 06/14/2017  County and Medicaid Number:  [place] [id number] Facility and Address:  [facility],  [address], [place] [address]      Provider Number: [id number]  Attending Physician Name and Address:  Jackson-Moore, Lisa, MD  Relative Name and Phone Number:       Current Level of Care: Hospital Recommended Level of Care: Skilled Nursing Facility Prior Approval Number:    Date Approved/Denied:   PASRR Number:   [id number]  Discharge Plan:      Current Diagnoses: Patient Active Problem List   Diagnosis Date Noted  . Postprocedural hematoma of a genitourinary system organ or structure following other procedure 06/14/2017  . Pelvic fluid collection 06/14/2017  . 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  . Genetic testing 01/29/2016  . 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  . Breast cancer of upper-outer quadrant of right female breast (HCC) 12/24/2015  . Family history of breast cancer in mother 12/24/2015  . Family history of ovarian cancer 12/24/2015  . Chronic cholecystitis with calculus 04/04/2014  . Diabetes (HCC) 07/24/2007  . HYPERLIPIDEMIA, MIXED 07/24/2007  . ANEMIA, NORMOCYTIC 07/24/2007  . DISORDER, SCHIZO, RESIDUAL TYPE, CHRONIC 07/24/2007  . Essential hypertension 07/24/2007  . GERD 07/24/2007  . DELIVERY, NORMAL 07/24/2007  . HX, PERSONAL, SCHIZOPHRENIA 07/24/2007    Orientation RESPIRATION BLADDER Height & Weight     Self, Time, Situation, Place  Normal External catheter Weight: 171 lb 15.3 oz (78 kg) Height:  5' 1" (154.9 cm)  BEHAVIORAL SYMPTOMS/MOOD NEUROLOGICAL BOWEL NUTRITION STATUS      Continent  (Regular)  AMBULATORY STATUS COMMUNICATION OF NEEDS Skin   Independent Verbally Other (Comment) (Abdomen Incision)                       Personal Care Assistance  Level of Assistance  Bathing, Feeding, Dressing Bathing Assistance: Limited assistance Feeding assistance: Independent Dressing Assistance: Limited assistance     Functional Limitations Info  Sight, Hearing, Speech Sight Info: Adequate Hearing Info: Adequate Speech Info: Adequate    SPECIAL CARE FACTORS FREQUENCY                       Contractures Contractures Info: Not present    Additional Factors Info  Insulin Sliding Scale, Psychotropic Code Status Info: Fullcode Allergies Info: No Known Allergies Psychotropic Info: Depakote, Zyprexa Insulin Sliding Scale Info: Dose: 0-15 Units- 3 times daily with meals , Dose: 0-5 Units-Daily at bedtime        Current Medications (06/16/2017):  This is the current hospital active medication list Current Facility-Administered Medications  Medication Dose Route Frequency Provider Last Rate Last Dose  . 0.45 % sodium chloride infusion   Intravenous Continuous Jackson-Moore, Lisa, MD 50 mL/hr at 06/16/17 0600    . benztropine (COGENTIN) tablet 0.5 mg  0.5 mg Oral BID Jackson-Moore, Lisa, MD   0.5 mg at 06/16/17 0933  . carvedilol (COREG) tablet 3.125 mg  3.125 mg Oral BID WC Jackson-Moore, Lisa, MD   3.125 mg at 06/16/17 0739  . cromolyn (OPTICROM) 4 % ophthalmic solution 1 drop  1 drop Both Eyes QID Jackson-Moore, Lisa, MD   1 drop at 06/16/17 0933  . cycloSPORINE (RESTASIS) 0.05 % ophthalmic emulsion 1 drop  1 drop Both Eyes BID Jackson-Moore, Lisa, MD   1 drop at 06/16/17 0933  .   divalproex (DEPAKOTE ER) 24 hr tablet 1,000 mg  1,000 mg Oral QHS Lahoma Crocker, MD   1,000 mg at 06/15/17 2221  . HYDROmorphone (DILAUDID) injection 0.2-0.6 mg  0.2-0.6 mg Intravenous Q2H PRN Lahoma Crocker, MD      . ibuprofen (ADVIL,MOTRIN) tablet 600 mg  600 mg Oral Q6H PRN Lahoma Crocker, MD      . insulin aspart (novoLOG) injection 0-15 Units  0-15 Units Subcutaneous TID WC Lahoma Crocker, MD   Stopped at 06/15/17 [phone]  . insulin  aspart (novoLOG) injection 0-5 Units  0-5 Units Subcutaneous QHS Lahoma Crocker, MD      . lisinopril (PRINIVIL,ZESTRIL) tablet 10 mg  10 mg Oral q morning - 10a Lahoma Crocker, MD   10 mg at 06/16/17 0933  . OLANZapine (ZYPREXA) tablet 10 mg  10 mg Oral QHS Lahoma Crocker, MD   10 mg at 06/15/17 2221  . ondansetron (ZOFRAN) tablet 4 mg  4 mg Oral Q6H PRN Lahoma Crocker, MD       Or  . ondansetron [facility]) injection 4 mg  4 mg Intravenous Q6H PRN Lahoma Crocker, MD      . oxyCODONE-acetaminophen (PERCOCET/ROXICET) 5-325 MG per tablet 1-2 tablet  1-2 tablet Oral Q3H PRN Lahoma Crocker, MD   1 tablet at 06/15/17 1957  . pantoprazole (PROTONIX) EC tablet 40 mg  40 mg Oral Daily Lahoma Crocker, MD   40 mg at 06/16/17 0933  . piperacillin-tazobactam (ZOSYN) IVPB 3.375 g  3.375 g Intravenous Q8H Lahoma Crocker, MD 12.5 mL/hr at 06/16/17 0700 3.375 g at 06/16/17 0700  . simethicone (MYLICON) chewable tablet 80 mg  80 mg Oral QID PRN Lahoma Crocker, MD      . simvastatin (ZOCOR) tablet 10 mg  10 mg Oral QHS Lahoma Crocker, MD   10 mg at 06/15/17 2221   Facility-Administered Medications Ordered in Other Encounters  Medication Dose Route Frequency Provider Last Rate Last Dose  . 0.9 %  sodium chloride infusion   Intravenous Once Nicholas Lose, MD      . sodium chloride flush (NS) 0.9 % injection 10 mL  10 mL Intracatheter PRN Nicholas Lose, MD   10 mL at 04/14/16 1127     Discharge Medications: Please see discharge summary for a list of discharge medications.  Relevant Imaging Results:  Relevant Lab Results:   Additional Information SSN: [id number]  Lia Hopping, LCSW

## 2017-06-16 NOTE — Progress Notes (Signed)
Discharge planning, spoke with CSW, Marlowe Kays at the group home is ok with return as long as [place] support is ordered. Contacted several agencies, [facility] accepted patient for assistance with drain care. Connie requesting FL2, CSW notified and will complete. [phone]

## 2017-06-16 NOTE — Progress Notes (Signed)
Pt was discharged home today. Instructions were reviewed with patient, and questions were answered. Pt was taken to main entrance via wheelchair by NT.  

## 2017-06-16 NOTE — Discharge Summary (Signed)
Physician Discharge Summary  Patient ID: Alison [name] MRN: [id number] DOB/AGE: [birth year] 56 y.o.  Admit date: 06/14/2017 Discharge date: 06/16/2017  Admission Diagnoses: Postprocedural hematoma of a genitourinary system organ or structure following other procedure Pelvic fluid collection  Discharge Diagnoses:  Principal Problem:   Postprocedural hematoma of a genitourinary system organ or structure following other procedure Active Problems:   Pelvic fluid collection   Discharged Condition:  The patient is in good condition and stable for discharge.   Hospital Course: The patient presented to the ED for evaluation of abdominal pain.  CT AP was performed and she was then admitted for a pelvic collection--ddx seroma, hematoma or less likely abscess.  Urinary retention--possibly 2/2 to the pelvic process.  S/P robotic-assisted BSO on 06/01/17 with Dr. Janie Morning.  Zosyn IV was initiated and IR was consulted for drain placement.  She was discharged home to her group home with home health care to manage and monitor the pelvic drain. Augmentin prescribed for 7 days. No growth on fluid culture.     Consults: IR  Significant Diagnostic Studies: Labs, CT  Treatments: drain placement, IV antibiotics, foley for urinary retention  Discharge Exam: Blood pressure 120/72, pulse 75, temperature 98.6 F (37 C), temperature source Oral, resp. rate 14, height 5\' 1"  (1.549 m), weight 171 lb 15.3 oz (78 kg), SpO2 100 %. General appearance: cooperative, appears stated age, no distress and resting quietly but awakened when entering the room, equal facial movements with smile, equal grips Resp: clear to auscultation bilaterally Cardio: regular rate and rhythm, S1, S2 normal, no murmur, click, rub or gallop GI: soft, non-tender; bowel sounds normal; no masses,  no organomegaly Extremities: extremities normal, atraumatic, no cyanosis or edema Incision/Wound: Drain to the left abdomen in place, dressing  dry and intact. Lap sites to the abdomen healing without erythema or drainage  Disposition: 01-Home or Self Care (Group Home)  Discharge Instructions    Call MD for:  difficulty breathing, headache or visual disturbances    Complete by:  As directed    Call MD for:  extreme fatigue    Complete by:  As directed    Call MD for:  hives    Complete by:  As directed    Call MD for:  persistant dizziness or light-headedness    Complete by:  As directed    Call MD for:  persistant nausea and vomiting    Complete by:  As directed    Call MD for:  redness, tenderness, or signs of infection (pain, swelling, redness, odor or green/yellow discharge around incision site)    Complete by:  As directed    Call MD for:  severe uncontrolled pain    Complete by:  As directed    Call MD for:  temperature >100.4    Complete by:  As directed    Diet - low sodium heart healthy    Complete by:  As directed    Increase activity slowly    Complete by:  As directed    Lifting restrictions    Complete by:  As directed    No lifting greater than 10 lbs.   Sexual Activity Restrictions    Complete by:  As directed    No sexual activity, nothing in the vagina, for 8 weeks from surgery.     Allergies as of 06/16/2017   No Known Allergies     Medication List    TAKE these medications   amoxicillin-clavulanate 875-125 MG tablet Commonly known  as:  AUGMENTIN Take 1 tablet by mouth 2 (two) times daily.   benztropine 0.5 MG tablet Commonly known as:  COGENTIN Take 0.5 mg by mouth 2 (two) times daily.   carvedilol 3.125 MG tablet Commonly known as:  COREG TAKE 1 TABLET BY MOUTH TWICE DAILY.   cromolyn 4 % ophthalmic solution Commonly known as:  OPTICROM Place 1 drop into both eyes 4 (four) times daily.   cycloSPORINE 0.05 % ophthalmic emulsion Commonly known as:  RESTASIS Place 1 drop into both eyes 2 (two) times daily.   divalproex 500 MG 24 hr tablet Commonly known as:  DEPAKOTE ER Take 1,000  mg by mouth at bedtime.   furosemide 40 MG tablet Commonly known as:  LASIX Take 40 mg by mouth every morning.   lisinopril 10 MG tablet Commonly known as:  PRINIVIL,ZESTRIL Take 10 mg by mouth every morning.   metFORMIN 500 MG tablet Commonly known as:  GLUCOPHAGE Take 500 mg by mouth 2 (two) times daily with a meal.   OLANZapine 10 MG tablet Commonly known as:  ZYPREXA Take 10 mg by mouth at bedtime.   omeprazole 20 MG capsule Commonly known as:  PRILOSEC Take 40 mg by mouth every morning.   oxyCODONE-acetaminophen 5-325 MG tablet Commonly known as:  PERCOCET/ROXICET Take 1-2 tablets by mouth every 4 (four) hours as needed for severe pain.   simvastatin 10 MG tablet Commonly known as:  ZOCOR Take 10 mg by mouth at bedtime.      Follow-up Information    [facility] Follow up.   Specialty:  Radiology Contact information: [address] [id number] [place] [phone]       [facility], [facility] Follow up.   Specialty:  [place]:  nurse for drain care and teaching Contact information: [place] [id number] [phone]           Greater than thirty minutes were spend for face to face discharge instructions and discharge orders/summary in EPIC.   Signed: Brelynn Wheller DEAL 06/16/2017, 2:15 PM

## 2017-06-16 NOTE — NC FL2 (Deleted)
[place] MEDICAID FL2 LEVEL OF CARE SCREENING TOOL     IDENTIFICATION  Patient Name: Alison [name] Birthdate: 09/24/1961 Sex: female Admission Date (Current Location): 06/14/2017  County and Medicaid Number:  [place] [id number] Facility and Address:  [facility],  [address], [place] [address]      Provider Number: [id number]  Attending Physician Name and Address:  Jackson-Moore, Lisa, MD  Relative Name and Phone Number:       Current Level of Care: Hospital Recommended Level of Care: Skilled Nursing Facility Prior Approval Number:    Date Approved/Denied:   PASRR Number:   [id number]  Discharge Plan:      Current Diagnoses: Patient Active Problem List   Diagnosis Date Noted  . Postprocedural hematoma of a genitourinary system organ or structure following other procedure 06/14/2017  . Pelvic fluid collection 06/14/2017  . 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  . Genetic testing 01/29/2016  . 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  . Breast cancer of upper-outer quadrant of right female breast (HCC) 12/24/2015  . Family history of breast cancer in mother 12/24/2015  . Family history of ovarian cancer 12/24/2015  . Chronic cholecystitis with calculus 04/04/2014  . Diabetes (HCC) 07/24/2007  . HYPERLIPIDEMIA, MIXED 07/24/2007  . ANEMIA, NORMOCYTIC 07/24/2007  . DISORDER, SCHIZO, RESIDUAL TYPE, CHRONIC 07/24/2007  . Essential hypertension 07/24/2007  . GERD 07/24/2007  . DELIVERY, NORMAL 07/24/2007  . HX, PERSONAL, SCHIZOPHRENIA 07/24/2007    Orientation RESPIRATION BLADDER Height & Weight     Self, Time, Situation, Place  Normal External catheter Weight: 171 lb 15.3 oz (78 kg) Height:  5' 1" (154.9 cm)  BEHAVIORAL SYMPTOMS/MOOD NEUROLOGICAL BOWEL NUTRITION STATUS      Continent  (Regular)  AMBULATORY STATUS COMMUNICATION OF NEEDS Skin   Independent Verbally Other (Comment) (Abdomen Incision)                       Personal Care Assistance  Level of Assistance  Bathing, Feeding, Dressing Bathing Assistance: Limited assistance Feeding assistance: Independent Dressing Assistance: Limited assistance     Functional Limitations Info  Sight, Hearing, Speech Sight Info: Adequate Hearing Info: Adequate Speech Info: Adequate    SPECIAL CARE FACTORS FREQUENCY                       Contractures Contractures Info: Not present    Additional Factors Info  Insulin Sliding Scale, Psychotropic Code Status Info: Fullcode Allergies Info: No Known Allergies Psychotropic Info: Depakote, Zyprexa Insulin Sliding Scale Info: Dose: 0-15 Units- 3 times daily with meals , Dose: 0-5 Units-Daily at bedtime        Current Medications (06/16/2017):  This is the current hospital active medication list Current Facility-Administered Medications  Medication Dose Route Frequency Provider Last Rate Last Dose  . 0.45 % sodium chloride infusion   Intravenous Continuous Jackson-Moore, Lisa, MD 50 mL/hr at 06/16/17 0600    . benztropine (COGENTIN) tablet 0.5 mg  0.5 mg Oral BID Jackson-Moore, Lisa, MD   0.5 mg at 06/16/17 0933  . carvedilol (COREG) tablet 3.125 mg  3.125 mg Oral BID WC Jackson-Moore, Lisa, MD   3.125 mg at 06/16/17 0739  . cromolyn (OPTICROM) 4 % ophthalmic solution 1 drop  1 drop Both Eyes QID Jackson-Moore, Lisa, MD   1 drop at 06/16/17 0933  . cycloSPORINE (RESTASIS) 0.05 % ophthalmic emulsion 1 drop  1 drop Both Eyes BID Jackson-Moore, Lisa, MD   1 drop at 06/16/17 0933  .   divalproex (DEPAKOTE ER) 24 hr tablet 1,000 mg  1,000 mg Oral QHS Jackson-Moore, Lisa, MD   1,000 mg at 06/15/17 2221  . HYDROmorphone (DILAUDID) injection 0.2-0.6 mg  0.2-0.6 mg Intravenous Q2H PRN Jackson-Moore, Lisa, MD      . ibuprofen (ADVIL,MOTRIN) tablet 600 mg  600 mg Oral Q6H PRN Jackson-Moore, Lisa, MD      . insulin aspart (novoLOG) injection 0-15 Units  0-15 Units Subcutaneous TID WC Jackson-Moore, Lisa, MD   Stopped at 06/15/17 0842  . insulin  aspart (novoLOG) injection 0-5 Units  0-5 Units Subcutaneous QHS Jackson-Moore, Lisa, MD      . lisinopril (PRINIVIL,ZESTRIL) tablet 10 mg  10 mg Oral q morning - 10a Jackson-Moore, Lisa, MD   10 mg at 06/16/17 0933  . OLANZapine (ZYPREXA) tablet 10 mg  10 mg Oral QHS Jackson-Moore, Lisa, MD   10 mg at 06/15/17 2221  . ondansetron (ZOFRAN) tablet 4 mg  4 mg Oral Q6H PRN Jackson-Moore, Lisa, MD       Or  . ondansetron (ZOFRAN) injection 4 mg  4 mg Intravenous Q6H PRN Jackson-Moore, Lisa, MD      . oxyCODONE-acetaminophen (PERCOCET/ROXICET) 5-325 MG per tablet 1-2 tablet  1-2 tablet Oral Q3H PRN Jackson-Moore, Lisa, MD   1 tablet at 06/15/17 1957  . pantoprazole (PROTONIX) EC tablet 40 mg  40 mg Oral Daily Jackson-Moore, Lisa, MD   40 mg at 06/16/17 0933  . piperacillin-tazobactam (ZOSYN) IVPB 3.375 g  3.375 g Intravenous Q8H Jackson-Moore, Lisa, MD 12.5 mL/hr at 06/16/17 0700 3.375 g at 06/16/17 0700  . simethicone (MYLICON) chewable tablet 80 mg  80 mg Oral QID PRN Jackson-Moore, Lisa, MD      . simvastatin (ZOCOR) tablet 10 mg  10 mg Oral QHS Jackson-Moore, Lisa, MD   10 mg at 06/15/17 2221   Facility-Administered Medications Ordered in Other Encounters  Medication Dose Route Frequency Provider Last Rate Last Dose  . 0.9 %  sodium chloride infusion   Intravenous Once Gudena, Vinay, MD      . sodium chloride flush (NS) 0.9 % injection 10 mL  10 mL Intracatheter PRN Gudena, Vinay, MD   10 mL at 04/14/16 1127     Discharge Medications: Please see discharge summary for a list of discharge medications.  Relevant Imaging Results:  Relevant Lab Results:   Additional Information SSN: [id number]  Taysom Glymph A Kyndahl Jablon, LCSW    

## 2017-06-19 LAB — CULTURE, BLOOD (ROUTINE X 2)
Culture: NO GROWTH
Culture: NO GROWTH
Special Requests: ADEQUATE
Special Requests: ADEQUATE

## 2017-06-19 LAB — BODY FLUID CULTURE: Culture: NO GROWTH

## 2017-06-20 ENCOUNTER — Other Ambulatory Visit: Payer: Self-pay | Admitting: Gynecologic Oncology

## 2017-06-20 ENCOUNTER — Other Ambulatory Visit: Payer: Medicaid Other

## 2017-06-20 ENCOUNTER — Telehealth: Payer: Self-pay

## 2017-06-20 DIAGNOSIS — R188 Other ascites: Secondary | ICD-10-CM

## 2017-06-20 NOTE — Telephone Encounter (Signed)
Told Ms  Alison [name] that she needs to call the IR drain clinic at [phone].  The drainage needs to be < 10 cc for two consecutive days and a f/u CT pelvis with IV contrast will need to be set up by clinic per Joylene John, NP. Ms Alison [name] verbalized understanding.

## 2017-06-21 ENCOUNTER — Other Ambulatory Visit: Payer: Medicaid Other

## 2017-06-27 ENCOUNTER — Ambulatory Visit
Admission: RE | Admit: 2017-06-27 | Discharge: 2017-06-27 | Disposition: A | Discharge status: Home / Self Care | Payer: Medicaid Other | Source: Ambulatory Visit | Attending: Gynecologic Oncology | Admitting: Gynecologic Oncology

## 2017-06-27 DIAGNOSIS — R188 Other ascites: Secondary | ICD-10-CM

## 2017-06-27 HISTORY — PX: IR RADIOLOGIST EVAL & MGMT: IMG5224

## 2017-06-27 MED ORDER — IOPAMIDOL (ISOVUE-300) INJECTION 61%
100.0000 mL | Freq: Once | INTRAVENOUS | Status: AC | PRN
  Administered 2017-06-27: 100 mL via INTRAVENOUS

## 2017-06-27 NOTE — Progress Notes (Signed)
Patient's CT scan was reviewed with Dr. Earleen Newport.  It was then reviewed with GYN/ONC as she did have some residual collection, but given its sterility from cultures, GYN/ONC recommended we proceed with removing the drain instead of replacing the drain.  The patient states that minimal is coming from her drain and she has no pain.  Her drain was removed with no issues.    Rondrick Barreira E 3:06 PM 06/27/2017

## 2017-06-28 NOTE — Progress Notes (Signed)
GYN ONCOLOGY OFFICE VISIT   Referring Clinician:  Dr. Bard [name] 56 y.o. female  CC:  Chief Complaint  Patient presents with  . BRCA gene mutation positive    Assessment/Plan:  [name] is a 56 y.o. 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 Additionally, 1 VUS, called "c.16A>C (p.Thr6Pro)" was found in one copy of the MSH6 gene.  Adnexa additionally are normal on UTZ and CA 125  Is wnl.  There is a remote history of hysterectomy.  On June 01, 2017 she underwent robotic-assisted bilateral salpingo-oophorectomy with washings. Path without malignancy neoplasia or dysplasia.  Post of seroma required drainage.  Now doing well. Follow-up with Gyn Onc in 6 months Follow-up with Dr. 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 is a 56 y.o.   5 para 5 with a diagnosis of a right upper outer quadrant breast mass in December 2016. She subsequently has undergone lumpectomy and adjuvant chemotherapy Adriamycin and Cytoxan followed by radiation therapy which was completed in September 2017. 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 Additionally, 1 VUS, called "c.16A>C (p.Thr6Pro)" was found in one copy of the MSH6 gene .  Alison [name]  Has a diagnosis of schizophrenia and defers to her mother for her medical history. They  report NSVD 4, one cesarean section and a bilateral tubal ligation. Family history is notable for mother with breast cancer diagnosed at the age of 97 a maternal aunt with ovarian cancer diagnosed in his 69s a maternal cousin diagnosed with breast cancer at an unspecified age. There are 2 maternal uncles with prostate cancer diagnosed at an unspecified age and a brother with vocal cord cancer.   [name] is unclear about when  she became menopausal.   Review of the medical record is notable for a note in 2015 within the past surgical history documentation of a laparoscopic vaginal hysterectomy. An MRI of the hip in 2007 notes that the uterus is not visualized.  Pelvic ultrasound 03/21/2017 confirms the absence of a uterus.  Left ovary 2.2x1.0x1.8 with a 31m calcification along the edge R ovary 3.0x1.3x2.2cm .  No free fluid.  CA 125 03/2017 9.9   S/P Robotic assisted [facility] 06/01/2017. Path 1. Ovary and fallopian tube, right - NO DIAGNOSTIC ABNORMALITY 2. Ovary and fallopian tube, left - NO DIAGNOSTIC ABNORMALITY Cytology without malignant cells.  Presented to the ER 06/14/2017 with urinary retention and a pelvic fluid collection.  Collection was non infectious and was drained by VIR.  Culture neg WBC wnl process consistent with pelvic seroma  Review of Systems:  Constitutional  Feels well,  Cardiovascular  No chest pain, shortness of breath, or edema  Pulmonary  No cough or wheeze.  Gastro Intestinal  No nausea, vomitting, or diarrhoea. No bright red blood per rectum, no abdominal pain, change in bowel movement, or constipation. No abdominal bloating excellent appetite Genito Urinary  No frequency, urgency, dysuria, no vaginal bleeding Psychology  No depression, anxiety, insomnia.    Current Meds:   Social Hx:   Social History   Social History  . Marital status: Single    Spouse name: N/A  . Number of children: N/A  . Years of education: N/A   Occupational History  . Not on file.   Social History Main Topics  . Smoking status: Former Smoker  Packs/day: 0.25    Types: Cigarettes    Quit date: 12/2015  . Smokeless tobacco: Never Used  . Alcohol use Yes     Comment: "only on special occasions"   . Drug use: No  . Sexual activity: Yes    Birth control/ protection: Surgical   Other Topics Concern  . Not on file   Social History Narrative  . No narrative on file    Past Surgical Hx:   Past Surgical History:  Procedure Laterality Date  . ABDOMINAL HYSTERECTOMY    . BREAST LUMPECTOMY Right 2017  . BREAST LUMPECTOMY WITH RADIOACTIVE SEED AND SENTINEL LYMPH NODE BIOPSY Right 01/20/2016   Procedure: RIGHT BREAST LUMPECTOMY WITH RADIOACTIVE SEED AND SENTINEL LYMPH NODE BIOPSY;  Surgeon: Coralie Keens, MD;  Location: [facility];  Service: General;  Laterality: Right;  . CHOLECYSTECTOMY N/A 04/16/2014   Procedure: LAPAROSCOPIC CHOLECYSTECTOMY ;  Surgeon: [name]. Georgette Dover, MD;  Location: [facility];  Service: General;  Laterality: N/A;  . DEBULKING N/A 06/01/2017   Procedure: POSSIBLE DEBULKING;  Surgeon: Janie Morning, MD;  Location: [facility];  Service: Gynecology;  Laterality: N/A;  . PORT-A-CATH REMOVAL Left 10/13/2016   Procedure: REMOVAL PORT-A-CATH;  Surgeon: Coralie Keens, MD;  Location: [facility];  Service: General;  Laterality: Left;  . PORTACATH PLACEMENT Left 01/20/2016   Procedure: INSERTION PORT-A-CATH;  Surgeon: Coralie Keens, MD;  Location: [facility];  Service: General;  Laterality: Left;  . ROBOTIC ASSISTED BILATERAL SALPINGO OOPHERECTOMY N/A 06/01/2017   Procedure: XI ROBOTIC ASSISTED BILATERAL SALPINGO OOPHORECTOMY AND PELVIC WASHINGS;  Surgeon: Janie Morning, MD;  Location: [facility];  Service: Gynecology;  Laterality: N/A;    Past Medical Hx:  Past Medical History:  Diagnosis Date  . Anemia   . Cancer ([place])   . Depression    chronic   . Diabetes mellitus without complication ([place])    type II  . Gallstones   . GERD (gastroesophageal reflux disease)   . History of radiation therapy 07/25/16- 09/05/16   Right Breast  . Hyperlipidemia   . Hypertension   . Schizophrenia ([place])   Bilateral cataracts   Past Gynecological History: Gravida 5 para 5 unknown dates of menarche or menopause. Reports prior bilateral tubal ligation, reports a normal Pap test last year and denies any history of abnormal Pap tests  No LMP recorded. Patient has  had a hysterectomy. Per the medical record  Family Hx:  Family History  Problem Relation Age of Onset  . Breast cancer Mother 8       s/p partial mastectomy  . Skin cancer Mother        on right knee; unspecified type  . Colon polyps Mother        three total  . Other Mother 61       history of a TAH-BSO for bleeding  . Prostate cancer Maternal Uncle        (x2 maternal uncles)  . Cirrhosis Maternal Grandfather        +EtOH  . Cancer Brother 55       vocal cord ca; not a smoker  . Autism Brother   . Cancer Maternal Uncle        vocal cord ca; +smoker  . Ovarian cancer Maternal Aunt        dx. >50  . Cancer Maternal Aunt        unspecified type  . Diabetes Maternal Aunt   . Colon cancer Cousin        (  x2 maternal first cousins--sisters)  . Breast cancer Cousin        dx. 82 or younger  . Cancer Other        NOS cancer; lim info; all three of his daughters had cancer as well  . Breast cancer Other     Vitals:  Blood pressure (!) 95/57, pulse 96, temperature 98.3 F (36.8 C), resp. rate 16, height 5' 4.06" (1.627 m), weight 165 lb (74.8 kg).  Physical Exam: WD in NAD Neck  Supple NROM, without any enlargements, sclera nonicteric.  Skin  No rash/lesions/breakdown  Psychiatry  Alert and oriented appropriate mood affect speech and reasoning. Abdomen  Normoactive bowel sounds, abdomen soft, non-tender. Midline vertical incision intact without evidence of hernia. Port sites intact.  No hernia, no masses. Back No CVA tenderness Pelvic deferred.  Extremities  No bilateral cyanosis, clubbing or edema.   Janie Morning, MD, PhD 07/13/2017, 9:59 AM

## 2017-06-29 ENCOUNTER — Ambulatory Visit: Payer: Medicaid Other | Attending: Gynecologic Oncology | Admitting: Gynecologic Oncology

## 2017-06-29 ENCOUNTER — Encounter: Payer: Self-pay | Admitting: Gynecologic Oncology

## 2017-06-29 ENCOUNTER — Telehealth: Payer: Self-pay

## 2017-06-29 VITALS — BP 95/57 | HR 96 | Temp 98.3°F | Resp 16 | Ht 64.06 in | Wt 165.0 lb

## 2017-06-29 DIAGNOSIS — Z1509 Genetic susceptibility to other malignant neoplasm: Secondary | ICD-10-CM | POA: Diagnosis not present

## 2017-06-29 DIAGNOSIS — Z808 Family history of malignant neoplasm of other organs or systems: Secondary | ICD-10-CM | POA: Insufficient documentation

## 2017-06-29 DIAGNOSIS — Z803 Family history of malignant neoplasm of breast: Secondary | ICD-10-CM | POA: Diagnosis not present

## 2017-06-29 DIAGNOSIS — Z1501 Genetic susceptibility to malignant neoplasm of breast: Secondary | ICD-10-CM | POA: Insufficient documentation

## 2017-06-29 DIAGNOSIS — Z87891 Personal history of nicotine dependence: Secondary | ICD-10-CM | POA: Diagnosis not present

## 2017-06-29 DIAGNOSIS — Z8042 Family history of malignant neoplasm of prostate: Secondary | ICD-10-CM | POA: Insufficient documentation

## 2017-06-29 DIAGNOSIS — Z8041 Family history of malignant neoplasm of ovary: Secondary | ICD-10-CM | POA: Diagnosis not present

## 2017-06-29 NOTE — Patient Instructions (Signed)
Plan to follow up with Dr. Lindi Adie.

## 2017-06-29 NOTE — Telephone Encounter (Signed)
Serita with group home where Ms Ezra resides asked Ms Nahm was felling ok after drain removal on 06-27-17. Mother wants her to keep appointment as scheduled today as she has made arrangements for transportation.

## 2017-07-13 ENCOUNTER — Ambulatory Visit: Payer: Medicaid Other | Admitting: Gynecologic Oncology

## 2017-08-16 ENCOUNTER — Ambulatory Visit: Payer: Self-pay | Admitting: Hematology and Oncology

## 2017-08-16 NOTE — Assessment & Plan Note (Deleted)
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 Additionally, one variant of uncertain significance, called "c.16A>C (p.Thr6Pro)" was found in one copy of the MSH6gene. 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 Adjuvant chemotherapy (Adriamycin and Cytoxan dose dense 4 followed by Abraxane weekly 12) started 02/11/2016 completed 06/30/2016 2. Followed by radiation started 07/25/2016 completed 09/05/2016 3. Bilateral salpingo-oophorectomy 06/01/2017 when she presented with suspected pelvic abscess: No cancer  Surveillance: 1. Mammograms 02/16/17 and U/s: 5 mm circumscribed mass in the right axilla at the site of palpable abnormality 3 month follow-up ultrasound was recommended. This has not been done. 2. Breast exam: 08/16/17: Benign  I will set her up for a three-month follow-up ultrasound.

## 2017-08-22 ENCOUNTER — Ambulatory Visit: Payer: Self-pay | Admitting: Hematology and Oncology

## 2017-08-22 ENCOUNTER — Ambulatory Visit (HOSPITAL_BASED_OUTPATIENT_CLINIC_OR_DEPARTMENT_OTHER): Payer: Medicaid Other | Admitting: Hematology and Oncology

## 2017-08-22 ENCOUNTER — Encounter: Payer: Self-pay | Admitting: Hematology and Oncology

## 2017-08-22 DIAGNOSIS — Z171 Estrogen receptor negative status [ER-]: Secondary | ICD-10-CM

## 2017-08-22 DIAGNOSIS — Z9221 Personal history of antineoplastic chemotherapy: Secondary | ICD-10-CM

## 2017-08-22 DIAGNOSIS — C50411 Malignant neoplasm of upper-outer quadrant of right female breast: Secondary | ICD-10-CM | POA: Diagnosis present

## 2017-08-22 DIAGNOSIS — Z923 Personal history of irradiation: Secondary | ICD-10-CM | POA: Diagnosis not present

## 2017-08-22 NOTE — Assessment & Plan Note (Signed)
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 Additionally, one variant of uncertain significance, called "c.16A>C (p.Thr6Pro)" was found in one copy of the MSH6gene. 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 Adjuvant chemotherapy (Adriamycin and Cytoxan dose dense 4 followed by Abraxane weekly 12) started 02/11/2016 completed 06/30/2016 2. Followed by radiation started 07/25/2016 completed 09/05/2016 3. Bilateral salpingo-oophorectomy 06/01/2017 when she presented with suspected pelvic abscess: No cancer  Surveillance: 1. Mammograms 02/16/17 and U/s: 5 mm circumscribed mass in the right axilla at the site of palpable abnormality 3 month follow-up ultrasound was recommended. This has not been done. 2. Breast exam: 08/22/17: Benign  I will set her up for a three-month follow-up ultrasound.

## 2017-08-22 NOTE — Assessment & Plan Note (Deleted)
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 Additionally, one variant of uncertain significance, called "c.16A>C (p.Thr6Pro)" was found in one copy of the MSH6gene. 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 Adjuvant chemotherapy (Adriamycin and Cytoxan dose dense 4 followed by Abraxane weekly 12) started 02/11/2016 completed 06/30/2016 2. Followed by radiation started 07/25/2016 completed 09/05/2016 3. Bilateral salpingo-oophorectomy 06/01/2017 when she presented with suspected pelvic abscess: No cancer  Surveillance: 1. Mammograms 02/16/17 and U/s: 5 mm circumscribed mass in the right axilla at the site of palpable abnormality 3 month follow-up ultrasound was recommended. This has not been done. 2. Breast exam: 08/22/17: Benign  I will set her up for a three-month follow-up ultrasound.

## 2017-08-22 NOTE — Progress Notes (Signed)
Patient Care Team: Benito Mccreedy, MD as PCP - General (Internal Medicine)  DIAGNOSIS:  Encounter Diagnosis  Name Primary?  . Malignant neoplasm of upper-outer quadrant of right breast in female, estrogen receptor negative ([place])     SUMMARY OF ONCOLOGIC HISTORY:   Breast cancer of upper-outer quadrant of right female breast ([place])   11/25/2015 Mammogram    Right breast mass 12 cm from the nipple: 1.4 x 1.3 x 0.9 cm, two right axillary lymph nodes with borderline cortical thickening measuring up to 3 mm      12/04/2015 Initial Diagnosis    Right breast biopsy 10:00: Invasive ductal carcinoma with DCIS, grade 2, ER 0%, PR 0%, HER-2 negative, ratio1.31, Ki-67 40%      01/13/2016 Procedure    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 Additionally, 1 VUS, called "c.16A>C (p.Thr6Pro)" was found in one copy of the MSH6 gene. Genes analyzed: ATM, BARD1, BRCA1, BRCA2, BRIP1, CDH1, CHEK2, FANCC, MLH1, MSH2, MSH6, NBN, PALB2, PMS2, PTEN, RAD51C, RAD51D, TP53, and XRCC2.  This panel also includes deletion/duplication analysis (without sequencing) for one gene, EPCAM      01/20/2016 Surgery    Rt Lumpectomy Ninfa Linden): IDC 1.7 cm, grade 3, with DCIS, 0/2 LN, ER 0%, PR 0%, Her 2 Neg Ratio 1.35, Ki 67: 40%, T1CN0 (stage 1A)      02/11/2016 - 06/30/2016 Chemotherapy    Adjuvant chemotherapy with dose dense Adriamycin and Cytoxan 4 followed by Abraxane weekly 12      07/25/2016 - 09/05/2016 Radiation Therapy    Adjuvant radiation therapy Isidore Moos). Right breast / 50 Gy in 25 fractions.  Right breast boost / 10 Gy in 5 fractions      06/01/2017 Surgery    Pelvic abscess: S/P Bil Salpingo Oophorectomy; No cancer       CHIEF COMPLIANT: Follow-up of history of breast cancer  INTERVAL HISTORY: Alison [name] is a 56 year old with above-mentioned history of right breast cancer underwent lumpectomy followed by adjuvant chemotherapy and radiation. In June 2018 showed pelvic  abscess and underwent oophorectomy. There was no malignancy there. She had a positive BRCA2 mutation. She complains of a mild discomfort in the right breast.  REVIEW OF SYSTEMS:   Constitutional: Denies fevers, chills or abnormal weight loss Eyes: Denies blurriness of vision Ears, nose, mouth, throat, and face: Denies mucositis or sore throat Respiratory: Denies cough, dyspnea or wheezes Cardiovascular: Denies palpitation, chest discomfort Gastrointestinal:  Denies nausea, heartburn or change in bowel habits Skin: Denies abnormal skin rashes Lymphatics: Denies new lymphadenopathy or easy bruising Neurological:Denies numbness, tingling or new weaknesses Behavioral/Psych: Mood is stable, no new changes  Extremities: No lower extremity edema Breast: Discomfort in the right breast All other systems were reviewed with the patient and are negative.  I have reviewed the past medical history, past surgical history, social history and family history with the patient and they are unchanged from previous note.  ALLERGIES:  has No Known Allergies.  MEDICATIONS:  Current Outpatient Prescriptions  Medication Sig Dispense Refill  . benztropine (COGENTIN) 0.5 MG tablet Take 0.5 mg by mouth 2 (two) times daily.    . carvedilol (COREG) 3.125 MG tablet TAKE 1 TABLET BY MOUTH TWICE DAILY. 60 tablet 3  . cromolyn (OPTICROM) 4 % ophthalmic solution Place 1 drop into both eyes 4 (four) times daily.    . cycloSPORINE (RESTASIS) 0.05 % ophthalmic emulsion Place 1 drop into both eyes 2 (two) times daily.    . divalproex (DEPAKOTE ER) 500 MG 24 hr tablet  Take 1,000 mg by mouth at bedtime.    . furosemide (LASIX) 40 MG tablet Take 40 mg by mouth every morning.    Marland Kitchen lisinopril (PRINIVIL,ZESTRIL) 10 MG tablet Take 10 mg by mouth every morning.     . metFORMIN (GLUCOPHAGE) 500 MG tablet Take 500 mg by mouth 2 (two) times daily with a meal.     . OLANZapine (ZYPREXA) 10 MG tablet Take 10 mg by mouth at bedtime.      Marland Kitchen omeprazole (PRILOSEC) 20 MG capsule Take 40 mg by mouth every morning.    . simvastatin (ZOCOR) 10 MG tablet Take 10 mg by mouth at bedtime.     No current facility-administered medications for this visit.    Facility-Administered Medications Ordered in Other Visits  Medication Dose Route Frequency Provider Last Rate Last Dose  . 0.9 %  sodium chloride infusion   Intravenous Once Nicholas Lose, MD      . sodium chloride flush (NS) 0.9 % injection 10 mL  10 mL Intracatheter PRN Nicholas Lose, MD   10 mL at 04/14/16 1127    PHYSICAL EXAMINATION: ECOG PERFORMANCE STATUS: 1 - Symptomatic but completely ambulatory  Vitals:   08/22/17 1138  BP: (!) 129/92  Pulse: 82  Resp: 18  Temp: 98.1 F (36.7 C)  SpO2: 99%   Filed Weights   08/22/17 1138  Weight: 168 lb (76.2 kg)    GENERAL:alert, no distress and comfortable SKIN: skin color, texture, turgor are normal, no rashes or significant lesions EYES: normal, Conjunctiva are pink and non-injected, sclera clear OROPHARYNX:no exudate, no erythema and lips, buccal mucosa, and tongue normal  NECK: supple, thyroid normal size, non-tender, without nodularity LYMPH:  no palpable lymphadenopathy in the cervical, axillary or inguinal LUNGS: clear to auscultation and percussion with normal breathing effort HEART: regular rate & rhythm and no murmurs and no lower extremity edema ABDOMEN:abdomen soft, non-tender and normal bowel sounds MUSCULOSKELETAL:no cyanosis of digits and no clubbing  NEURO: alert & oriented x 3 with fluent speech, no focal motor/sensory deficits EXTREMITIES: No lower extremity edema BREAST:A she is yet No palpable masses or nodules in either right or left breasts. No palpable axillary supraclavicular or infraclavicular adenopathy no breast tenderness or nipple discharge. (exam performed in the presence of a chaperone)  LABORATORY DATA:  I have reviewed the data as listed   Chemistry      Component Value Date/Time    NA 140 06/14/2017 1113   NA 143 09/23/2016 1000   K 3.9 06/14/2017 1113   K 4.2 09/23/2016 1000   CL 103 06/14/2017 1113   CO2 26 06/14/2017 1113   CO2 28 09/23/2016 1000   BUN 10 06/14/2017 1113   BUN 11.5 09/23/2016 1000   CREATININE 0.68 06/14/2017 1113   CREATININE 0.8 09/23/2016 1000      Component Value Date/Time   CALCIUM 8.9 06/14/2017 1113   CALCIUM 9.3 09/23/2016 1000   ALKPHOS 87 06/14/2017 1113   ALKPHOS 88 09/23/2016 1000   AST 23 06/14/2017 1113   AST 20 09/23/2016 1000   ALT 23 06/14/2017 1113   ALT 16 09/23/2016 1000   BILITOT 0.5 06/14/2017 1113   BILITOT 0.25 09/23/2016 1000       Lab Results  Component Value Date   WBC 5.6 06/14/2017   HGB 9.9 (L) 06/14/2017   HCT 29.7 (L) 06/14/2017   MCV 85.3 06/14/2017   PLT 327 06/14/2017   NEUTROABS 3.8 06/14/2017    ASSESSMENT & PLAN:  Breast cancer of upper-outer quadrant of right female breast ([place]) Rt Lumpectomy 01/20/16: IDC 1.7 cm, grade 3, with DCIS, 0/2 LN, ER 0%, PR 0%, Her 2 Neg Ratio 1.35, Ki 67: 40%, T1CN0 (stage 1A) (patient has mental handicap)  BRCA2 MutationPositive c.1310_[id number] (p.[id number])". Additionally, one variant of uncertain significance, called "c.16A>C (p.Thr6Pro)" was found in one copy of the MSH6gene. 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 Adjuvant chemotherapy (Adriamycin and Cytoxan dose dense 4 followed by Abraxane weekly 12) started 02/11/2016 completed 06/30/2016 2. Followed by radiation started 07/25/2016 completed 09/05/2016 3. Bilateral salpingo-oophorectomy 06/01/2017 when she presented with suspected pelvic abscess: No cancer  Surveillance: 1. Mammograms 02/16/17 and U/s: 5 mm circumscribed mass in the right axilla at the site of palpable abnormality 3 month follow-up ultrasound was recommended. This has not been done. 2. Breast exam: 08/22/17:  Benign 3. Breast MRI will be done in May 2019 4. Mammogram will need to be repeated in November 2018  I will set her up for a three-month follow-up ultrasound. Return to clinic in 6 months for follow-up and after that we can see her once a year   I spent 25 minutes talking to the patient of which more than half was spent in counseling and coordination of care.  No orders of the defined types were placed in this encounter.  The patient has a good understanding of the overall plan. she agrees with it. she will call with any problems that may develop before the next visit here.   Rulon Eisenmenger, MD 08/22/17

## 2017-08-29 ENCOUNTER — Ambulatory Visit: Payer: Self-pay | Admitting: Hematology and Oncology

## 2017-09-19 ENCOUNTER — Ambulatory Visit
Admission: RE | Admit: 2017-09-19 | Discharge: 2017-09-19 | Disposition: A | Discharge status: Home / Self Care | Payer: Medicaid Other | Source: Ambulatory Visit | Attending: Hematology and Oncology | Admitting: Hematology and Oncology

## 2017-09-19 ENCOUNTER — Other Ambulatory Visit: Payer: Self-pay | Admitting: Hematology and Oncology

## 2017-09-19 DIAGNOSIS — N63 Unspecified lump in unspecified breast: Secondary | ICD-10-CM

## 2017-09-19 DIAGNOSIS — R2231 Localized swelling, mass and lump, right upper limb: Secondary | ICD-10-CM

## 2017-09-25 ENCOUNTER — Encounter: Payer: Self-pay | Admitting: Interventional Radiology

## 2017-09-28 ENCOUNTER — Telehealth: Payer: Self-pay | Admitting: *Deleted

## 2017-09-28 NOTE — Telephone Encounter (Signed)
Contacted the group home and gve them the follow up appt for January 31st at 9:15am.

## 2018-01-01 ENCOUNTER — Telehealth: Payer: Self-pay | Admitting: *Deleted

## 2018-01-01 NOTE — Telephone Encounter (Signed)
Called and spoke with the patient's mother. Advised her that dr. Skeet Latch will not be in the office on January 31st, the patient will see Dr. Denman George .

## 2018-01-10 ENCOUNTER — Telehealth: Payer: Self-pay | Admitting: *Deleted

## 2018-01-10 NOTE — Telephone Encounter (Signed)
Returned the caregivers call and rescheduled the appt from tomorrow to February 28th.

## 2018-01-11 ENCOUNTER — Ambulatory Visit: Payer: Medicaid Other | Admitting: Gynecologic Oncology

## 2018-02-08 ENCOUNTER — Inpatient Hospital Stay: Payer: Medicaid Other | Attending: Gynecologic Oncology | Admitting: Gynecologic Oncology

## 2018-02-08 ENCOUNTER — Inpatient Hospital Stay: Payer: Medicaid Other

## 2018-02-08 ENCOUNTER — Encounter: Payer: Self-pay | Admitting: Gynecologic Oncology

## 2018-02-08 VITALS — BP 125/83 | HR 84 | Temp 97.6°F | Resp 18 | Wt 172.9 lb

## 2018-02-08 DIAGNOSIS — Z1509 Genetic susceptibility to other malignant neoplasm: Secondary | ICD-10-CM | POA: Diagnosis not present

## 2018-02-08 DIAGNOSIS — Z853 Personal history of malignant neoplasm of breast: Secondary | ICD-10-CM | POA: Diagnosis not present

## 2018-02-08 DIAGNOSIS — Z90710 Acquired absence of both cervix and uterus: Secondary | ICD-10-CM | POA: Insufficient documentation

## 2018-02-08 DIAGNOSIS — Z90722 Acquired absence of ovaries, bilateral: Secondary | ICD-10-CM

## 2018-02-08 DIAGNOSIS — Z1501 Genetic susceptibility to malignant neoplasm of breast: Secondary | ICD-10-CM

## 2018-02-08 NOTE — Progress Notes (Signed)
GYN ONCOLOGY OFFICE VISIT   Referring Clinician:  Dr. Bard Herbert 57 y.o. female  CC:  Chief Complaint  Patient presents with  . BRCA gene mutation positive    Assessment/Plan:  [name] is a 57 y.o. 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 Additionally, 1 VUS, called "c.16A>C (p.Thr6Pro)" was found in one copy of the MSH6 gene.  Adnexa additionally are normal on UTZ and CA 125  Is wnl.  There is a remote history of hysterectomy.  On June 01, 2017 she underwent robotic-assisted bilateral salpingo-oophorectomy with washings. Path without malignancy neoplasia or dysplasia.  Post of seroma required drainage.  Now doing well. Follow-up with Gyn Onc in 6 months Follow-up with Dr. 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 is a 57 y.o.   5 para 5 with a diagnosis of a right upper outer quadrant breast mass in December 2016. She subsequently has undergone lumpectomy and adjuvant chemotherapy Adriamycin and Cytoxan followed by radiation therapy which was completed in September 2017. 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 Additionally, 1 VUS, called "c.16A>C (p.Thr6Pro)" was found in one copy of the MSH6 gene .  Alison [name]  Has a diagnosis of schizophrenia and defers to her mother for her medical history. They  report NSVD 4, one cesarean section and a bilateral tubal ligation. Family history is notable for mother with breast cancer diagnosed at the age of 37 a maternal aunt with ovarian cancer diagnosed in his 32s a maternal cousin diagnosed with breast cancer at an unspecified age. There are 2 maternal uncles with prostate cancer diagnosed at an unspecified age and a brother with vocal cord cancer.   [name] is  unclear about when she became menopausal.   Review of the medical record is notable for a note in 2015 within the past surgical history documentation of a laparoscopic vaginal hysterectomy. An MRI of the hip in 2007 notes that the uterus is not visualized.  Pelvic ultrasound 03/21/2017 confirms the absence of a uterus.  Left ovary 2.2x1.0x1.8 with a 15m calcification along the edge R ovary 3.0x1.3x2.2cm .  No free fluid.  CA 125 03/2017 9.9   [place] 06/01/[place]. Path 1. Ovary and fallopian tube, right - NO DIAGNOSTIC ABNORMALITY 2. Ovary and fallopian tube, left - NO DIAGNOSTIC ABNORMALITY Cytology without malignant cells.  Presented to the ER 06/14/2017 with urinary retention and a pelvic fluid collection.  Collection was non infectious and was drained by VIR.  Culture neg WBC wnl process consistent with pelvic seroma  Review of Systems:  Constitutional  Feels well,  Cardiovascular  No chest pain, shortness of breath, or edema  Pulmonary  No cough or wheeze.  Gastro Intestinal  No nausea, vomitting, or diarrhoea. No bright red blood per rectum, no abdominal pain, change in bowel movement, or constipation. No abdominal bloating, excellent appetite Genito Urinary  No frequency, urgency, dysuria, no vaginal bleeding Psychology  No depression, anxiety, insomnia.    Current Meds:   Social Hx:   Social History   Socioeconomic History  . Marital status: Single    Spouse name: Not on file  . Number of children: Not on file  . Years of education: Not on file  . Highest education level: Not on file  Social Needs  .  Financial resource strain: Not on file  . Food insecurity - worry: Not on file  . Food insecurity - inability: Not on file  . Transportation needs - medical: Not on file  . Transportation needs - non-medical: Not on file  Occupational History  . Not on file  Tobacco Use  . Smoking status: Former Smoker    Packs/day: 0.25    Types: Cigarettes     Last attempt to quit: 12/2015    Years since quitting: 2.1  . Smokeless tobacco: Never Used  Substance and Sexual Activity  . Alcohol use: Yes    Comment: "only on special occasions"   . Drug use: No  . Sexual activity: Yes    Birth control/protection: Surgical  Other Topics Concern  . Not on file  Social History Narrative  . Not on file    Past Surgical Hx:  Past Surgical History:  Procedure Laterality Date  . ABDOMINAL HYSTERECTOMY    . BREAST LUMPECTOMY Right 2017  . BREAST LUMPECTOMY WITH RADIOACTIVE SEED AND SENTINEL LYMPH NODE BIOPSY Right 01/20/2016   Procedure: RIGHT BREAST LUMPECTOMY WITH RADIOACTIVE SEED AND SENTINEL LYMPH NODE BIOPSY;  Surgeon: Coralie Keens, MD;  Location: [facility];  Service: General;  Laterality: Right;  . CHOLECYSTECTOMY N/A 04/16/2014   Procedure: LAPAROSCOPIC CHOLECYSTECTOMY ;  Surgeon: [name]. Georgette Dover, MD;  Location: [facility];  Service: General;  Laterality: N/A;  . DEBULKING N/A 06/01/2017   Procedure: POSSIBLE DEBULKING;  Surgeon: Janie Morning, MD;  Location: [facility];  Service: Gynecology;  Laterality: N/A;  . IR RADIOLOGIST EVAL & MGMT  06/27/2017  . PORT-A-CATH REMOVAL Left 10/13/2016   Procedure: REMOVAL PORT-A-CATH;  Surgeon: Coralie Keens, MD;  Location: [facility];  Service: General;  Laterality: Left;  . PORTACATH PLACEMENT Left 01/20/2016   Procedure: INSERTION PORT-A-CATH;  Surgeon: Coralie Keens, MD;  Location: [facility];  Service: General;  Laterality: Left;  . ROBOTIC ASSISTED BILATERAL SALPINGO OOPHERECTOMY N/A 06/01/2017   Procedure: XI ROBOTIC ASSISTED BILATERAL SALPINGO OOPHORECTOMY AND PELVIC WASHINGS;  Surgeon: Janie Morning, MD;  Location: [facility];  Service: Gynecology;  Laterality: N/A;    Past Medical Hx:  Past Medical History:  Diagnosis Date  . Anemia   . Cancer ([place])   . Depression    chronic   . Diabetes mellitus without complication ([place])    type II  . Gallstones   . GERD  (gastroesophageal reflux disease)   . History of radiation therapy 07/25/16- 09/05/16   Right Breast  . Hyperlipidemia   . Hypertension   . Schizophrenia ([place])   Bilateral cataracts   Past Gynecological History: Gravida 5 para 5 unknown dates of menarche or menopause. Reports prior bilateral tubal ligation, reports a normal Pap test last year and denies any history of abnormal Pap tests  No LMP recorded. Patient has had a hysterectomy.  Family Hx:  Family History  Problem Relation Age of Onset  . Breast cancer Mother 59       s/p partial mastectomy  . Skin cancer Mother        on right knee; unspecified type  . Colon polyps Mother        three total  . Other Mother 33       history of a TAH-BSO for bleeding  . Prostate cancer Maternal Uncle        (x2 maternal uncles)  . Cirrhosis Maternal Grandfather        +EtOH  . Cancer Brother  55       vocal cord ca; not a smoker  . Autism Brother   . Cancer Maternal Uncle        vocal cord ca; +smoker  . Ovarian cancer Maternal Aunt        dx. >50  . Cancer Maternal Aunt        unspecified type  . Diabetes Maternal Aunt   . Colon cancer Cousin        (x2 maternal first cousins--sisters)  . Breast cancer Cousin        dx. 26 or younger  . Cancer Other        NOS cancer; lim info; all three of his daughters had cancer as well  . Breast cancer Other     Vitals:  Blood pressure 125/83, pulse 84, temperature 97.6 F (36.4 C), temperature source Oral, resp. rate 18, weight 172 lb 14.4 oz (78.4 kg), SpO2 99 %.  Physical Exam: WD in NAD Neck  Supple NROM, without any enlargements, sclera nonicteric.  Skin  No rash/lesions/breakdown  Psychiatry  Alert and oriented appropriate mood affect slow speech Abdomen  Normoactive bowel sounds, abdomen soft, non-tender. Midline vertical incision intact without evidence of hernia. Port sites intact.  No hernia, no masses. Back No CVA tenderness Pelvic: Normal external genitalia Bartholin's  urethra Skene's absent cervix no pelvic or vaginal masses Rectum: Good tone no masses no nodularity.  Extremities  No bilateral cyanosis, clubbing or edema.   Janie Morning, MD, PhD 02/08/2018, 1:12 PM

## 2018-02-08 NOTE — Patient Instructions (Signed)
Plan on having a CA 125 today and follow up with Dr. Skeet Latch in six months or sooner if needed.  Please call [phone] in June or July to schedule for September 2019.

## 2018-02-09 ENCOUNTER — Telehealth: Payer: Self-pay

## 2018-02-09 LAB — CA 125: Cancer Antigen (CA) 125: 14 U/mL (ref 0.0–38.1)

## 2018-02-09 NOTE — Telephone Encounter (Signed)
Told care giver April the results of the CA-125 as noted below by Joylene John, NP. Ms Boutwell had left the home for a day program.

## 2018-02-09 NOTE — Telephone Encounter (Signed)
-----   Message from Dorothyann Gibbs, NP sent at 02/09/2018  7:38 AM EST ----- Please let her know it is stable and within normal limits ----- Message ----- From: Interface, Lab In [place] Sent: 02/09/2018   4:37 AM To: Dorothyann Gibbs, NP

## 2018-02-20 ENCOUNTER — Telehealth: Payer: Self-pay | Admitting: Hematology and Oncology

## 2018-02-20 ENCOUNTER — Inpatient Hospital Stay: Payer: Medicaid Other | Attending: Gynecologic Oncology | Admitting: Hematology and Oncology

## 2018-02-20 DIAGNOSIS — Z1231 Encounter for screening mammogram for malignant neoplasm of breast: Secondary | ICD-10-CM

## 2018-02-20 DIAGNOSIS — Z7984 Long term (current) use of oral hypoglycemic drugs: Secondary | ICD-10-CM | POA: Diagnosis not present

## 2018-02-20 DIAGNOSIS — Z923 Personal history of irradiation: Secondary | ICD-10-CM | POA: Diagnosis not present

## 2018-02-20 DIAGNOSIS — Z9221 Personal history of antineoplastic chemotherapy: Secondary | ICD-10-CM

## 2018-02-20 DIAGNOSIS — Z79899 Other long term (current) drug therapy: Secondary | ICD-10-CM | POA: Insufficient documentation

## 2018-02-20 DIAGNOSIS — C50411 Malignant neoplasm of upper-outer quadrant of right female breast: Secondary | ICD-10-CM

## 2018-02-20 DIAGNOSIS — Z171 Estrogen receptor negative status [ER-]: Secondary | ICD-10-CM

## 2018-02-20 DIAGNOSIS — Z853 Personal history of malignant neoplasm of breast: Secondary | ICD-10-CM | POA: Diagnosis not present

## 2018-02-20 DIAGNOSIS — Z1501 Genetic susceptibility to malignant neoplasm of breast: Secondary | ICD-10-CM | POA: Diagnosis present

## 2018-02-20 NOTE — Telephone Encounter (Signed)
Gave patient AVs and calendar of upcoming march 2020 appointments.  °

## 2018-02-20 NOTE — Assessment & Plan Note (Signed)
Rt Lumpectomy 01/20/16: IDC 1.7 cm, grade 3, with DCIS, 0/2 LN, ER 0%, PR 0%, Her 2 Neg Ratio 1.35, Ki 67: 40%, T1CN0 (stage 1A) (patient has mental handicap)  BRCA2 MutationPositive c.[id number] (p.[id number])".Additionally, one variant of uncertain significance, called "c.16A>C (p.Thr6Pro)" was found in one copy of the MSH6gene. 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 Adjuvant chemotherapy (Adriamycin and Cytoxan dose dense 4 followed by Abraxane weekly 12) started 02/11/2016 completed 06/30/2016 2. Followed by radiation started 07/25/2016 completed 09/05/2016 3. Bilateral salpingo-oophorectomy 06/01/2017 when she presented with suspected pelvic abscess: No cancer  Surveillance: 1. Mammograms 02/16/17 and U/s: 5 mm circumscribed mass in the right axilla at the site of palpable abnormality 3 month follow-up ultrasound was recommended. This has not been done. 2. Breast exam: 08/22/17: Benign 3. Breast MRI will be done in May 2019 4. Mammogram will need to be repeated in November 2018 5.  Breast ultrasound axilla: Benign fat necrosis 09/19/2017  Return to clinic in 1 year for follow-up

## 2018-02-20 NOTE — Progress Notes (Signed)
Patient Care Team: Benito Mccreedy, MD as PCP - General (Internal Medicine)  DIAGNOSIS:  Encounter Diagnosis  Name Primary?  . Malignant neoplasm of upper-outer quadrant of right breast in female, estrogen receptor negative ([place])     SUMMARY OF ONCOLOGIC HISTORY:   Breast cancer of upper-outer quadrant of right female breast ([place])   11/25/2015 Mammogram    Right breast mass 12 cm from the nipple: 1.4 x 1.3 x 0.9 cm, two right axillary lymph nodes with borderline cortical thickening measuring up to 3 mm      12/04/2015 Initial Diagnosis    Right breast biopsy 10:00: Invasive ductal carcinoma with DCIS, grade 2, ER 0%, PR 0%, HER-2 negative, ratio1.31, Ki-67 40%      01/13/2016 Procedure    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 Additionally, 1 VUS, called "c.16A>C (p.Thr6Pro)" was found in one copy of the MSH6 gene. Genes analyzed: ATM, BARD1, BRCA1, BRCA2, BRIP1, CDH1, CHEK2, FANCC, MLH1, MSH2, MSH6, NBN, PALB2, PMS2, PTEN, RAD51C, RAD51D, TP53, and XRCC2.  This panel also includes deletion/duplication analysis (without sequencing) for one gene, EPCAM      01/20/2016 Surgery    Rt Lumpectomy Ninfa Linden): IDC 1.7 cm, grade 3, with DCIS, 0/2 LN, ER 0%, PR 0%, Her 2 Neg Ratio 1.35, Ki 67: 40%, T1CN0 (stage 1A)      02/11/2016 - 06/30/2016 Chemotherapy    Adjuvant chemotherapy with dose dense Adriamycin and Cytoxan 4 followed by Abraxane weekly 12      07/25/2016 - 09/05/2016 Radiation Therapy    Adjuvant radiation therapy Isidore Moos). Right breast / 50 Gy in 25 fractions.  Right breast boost / 10 Gy in 5 fractions      06/01/2017 Surgery    Pelvic abscess: S/P Bil Salpingo Oophorectomy; No cancer       CHIEF COMPLIANT: Annual follow-up of breast cancer   INTERVAL HISTORY: Alison [name] is a 57 year old with above-mentioned history of BRCA2 mutation positive right breast cancer treated with lumpectomy followed by adjuvant chemotherapy and radiation and  is currently on surveillance.  She reports no major health issues or concerns.  REVIEW OF SYSTEMS:   Constitutional: Denies fevers, chills or abnormal weight loss Eyes: Denies blurriness of vision Ears, nose, mouth, throat, and face: Denies mucositis or sore throat Respiratory: Denies cough, dyspnea or wheezes Cardiovascular: Denies palpitation, chest discomfort Gastrointestinal:  Denies nausea, heartburn or change in bowel habits Skin: Denies abnormal skin rashes Lymphatics: Denies new lymphadenopathy or easy bruising Neurological:Denies numbness, tingling or new weaknesses Behavioral/Psych: Mood is stable, no new changes  Extremities: No lower extremity edema Breast: Right lumpectomy All other systems were reviewed with the patient and are negative.  I have reviewed the past medical history, past surgical history, social history and family history with the patient and they are unchanged from previous note.  ALLERGIES:  has No Known Allergies.  MEDICATIONS:  Current Outpatient Medications  Medication Sig Dispense Refill  . benztropine (COGENTIN) 0.5 MG tablet Take 0.5 mg by mouth 2 (two) times daily.    . carvedilol (COREG) 3.125 MG tablet TAKE 1 TABLET BY MOUTH TWICE DAILY. 60 tablet 3  . cromolyn (OPTICROM) 4 % ophthalmic solution Place 1 drop into both eyes 4 (four) times daily.    . cycloSPORINE (RESTASIS) 0.05 % ophthalmic emulsion Place 1 drop into both eyes 2 (two) times daily.    . divalproex (DEPAKOTE ER) 500 MG 24 hr tablet Take 1,000 mg by mouth at bedtime.    . furosemide (LASIX) 40 MG  tablet Take 40 mg by mouth every morning.    Marland Kitchen lisinopril (PRINIVIL,ZESTRIL) 10 MG tablet Take 10 mg by mouth every morning.     . metFORMIN (GLUCOPHAGE) 500 MG tablet Take 500 mg by mouth 2 (two) times daily with a meal.     . OLANZapine (ZYPREXA) 10 MG tablet Take 10 mg by mouth at bedtime.    Marland Kitchen omeprazole (PRILOSEC) 20 MG capsule Take 40 mg by mouth every morning.    . simvastatin  (ZOCOR) 10 MG tablet Take 10 mg by mouth at bedtime.     No current facility-administered medications for this visit.    Facility-Administered Medications Ordered in Other Visits  Medication Dose Route Frequency Provider Last Rate Last Dose  . 0.9 %  sodium chloride infusion   Intravenous Once Nicholas Lose, MD      . sodium chloride flush (NS) 0.9 % injection 10 mL  10 mL Intracatheter PRN Nicholas Lose, MD   10 mL at 04/14/16 1127    PHYSICAL EXAMINATION: ECOG PERFORMANCE STATUS: 1 - Symptomatic but completely ambulatory  Vitals:   02/20/18 1121  BP: 115/78  Pulse: 90  Resp: 18  Temp: (!) 97.5 F (36.4 C)  SpO2: 97%   Filed Weights   02/20/18 1121  Weight: 176 lb 1.6 oz (79.9 kg)    GENERAL:alert, no distress and comfortable SKIN: skin color, texture, turgor are normal, no rashes or significant lesions EYES: normal, Conjunctiva are pink and non-injected, sclera clear OROPHARYNX:no exudate, no erythema and lips, buccal mucosa, and tongue normal  NECK: supple, thyroid normal size, non-tender, without nodularity LYMPH:  no palpable lymphadenopathy in the cervical, axillary or inguinal LUNGS: clear to auscultation and percussion with normal breathing effort HEART: regular rate & rhythm and no murmurs and no lower extremity edema ABDOMEN:abdomen soft, non-tender and normal bowel sounds MUSCULOSKELETAL:no cyanosis of digits and no clubbing  NEURO: alert & oriented x 3 with fluent speech, no focal motor/sensory deficits EXTREMITIES: No lower extremity edema  LABORATORY DATA:  I have reviewed the data as listed CMP Latest Ref Rng & Units 06/14/2017 05/25/2017 09/23/2016  Glucose 65 - 99 mg/dL 106(H) 148(H) 120  BUN 6 - 20 mg/dL 10 12 11.5  Creatinine 0.44 - 1.00 mg/dL 0.68 0.80 0.8  Sodium 135 - 145 mmol/L 140 143 143  Potassium 3.5 - 5.1 mmol/L 3.9 3.8 4.2  Chloride 101 - 111 mmol/L 103 104 -  CO2 22 - 32 mmol/L '26 29 28  '$ Calcium 8.9 - 10.3 mg/dL 8.9 9.0 9.3  Total Protein  6.5 - 8.1 g/dL 7.5 7.0 7.4  Total Bilirubin 0.3 - 1.2 mg/dL 0.5 0.3 0.25  Alkaline Phos 38 - 126 U/L 87 70 88  AST 15 - 41 U/L '23 28 20  '$ ALT 14 - 54 U/L 23 33 16    Lab Results  Component Value Date   WBC 5.6 06/14/2017   HGB 9.9 (L) 06/14/2017   HCT 29.7 (L) 06/14/2017   MCV 85.3 06/14/2017   PLT 327 06/14/2017   NEUTROABS 3.8 06/14/2017    ASSESSMENT & PLAN:  Breast cancer of upper-outer quadrant of right female breast ([place]) Rt Lumpectomy 01/20/16: IDC 1.7 cm, grade 3, with DCIS, 0/2 LN, ER 0%, PR 0%, Her 2 Neg Ratio 1.35, Ki 67: 40%, T1CN0 (stage 1A) (patient has mental handicap)  BRCA2 MutationPositive c.[id number] (p.[id number])".Additionally, one variant of uncertain significance, called "c.16A>C (p.Thr6Pro)" was found in one copy of the MSH6gene. Rt Lumpectomy 2017: IDC 1.7 cm, grade  3, with DCIS, 0/2 LN, ER 0%, PR 0%, Her 2 Neg Ratio 1.35, Ki 67: 40%, T1CN0 (stage 1A)  Treatment summary: 1. Adjuvant chemotherapy (Adriamycin and Cytoxan dose dense 4 followed by Abraxane weekly 12) started 02/11/2016 completed 06/30/2016 2. Followed by radiation started 07/25/2016 completed 09/05/2016 3. Bilateral salpingo-oophorectomy 06/01/2017 when she presented with suspected pelvic abscess: No cancer  Surveillance: 1. Mammograms 02/16/17 and U/s: 5 mm circumscribed mass in the right axilla at the site of palpable abnormality 3 month follow-up ultrasound was recommended. This has not been done. 2. Breast exam: 08/22/17: Benign 3. Breast MRI will be done in May 2019 4. Mammogram will need to be repeated in November 2018 5. Breast ultrasound axilla: Benign fat necrosis 09/19/2017  Return to clinic in 1 year for follow-up   I spent 25 minutes talking to the patient of which more than half was spent in counseling and coordination of care.  No orders of the defined types were placed in this encounter.  The patient has a good understanding of the overall plan. she agrees  with it. she will call with any problems that may develop before the next visit here.   Harriette Ohara, MD 02/20/18

## 2018-02-22 ENCOUNTER — Ambulatory Visit
Admission: RE | Admit: 2018-02-22 | Discharge: 2018-02-22 | Disposition: A | Discharge status: Home / Self Care | Payer: Medicaid Other | Source: Ambulatory Visit | Attending: Hematology and Oncology | Admitting: Hematology and Oncology

## 2018-02-22 DIAGNOSIS — C50411 Malignant neoplasm of upper-outer quadrant of right female breast: Secondary | ICD-10-CM

## 2018-02-22 DIAGNOSIS — Z171 Estrogen receptor negative status [ER-]: Secondary | ICD-10-CM

## 2018-02-22 HISTORY — DX: Personal history of irradiation: Z92.3

## 2018-02-22 HISTORY — DX: Personal history of antineoplastic chemotherapy: Z92.21

## 2018-04-19 ENCOUNTER — Ambulatory Visit (HOSPITAL_COMMUNITY)
Admission: RE | Admit: 2018-04-19 | Discharge: 2018-04-19 | Disposition: A | Discharge status: Home / Self Care | Payer: Medicaid Other | Source: Ambulatory Visit | Attending: Hematology and Oncology | Admitting: Hematology and Oncology

## 2018-04-19 DIAGNOSIS — Z853 Personal history of malignant neoplasm of breast: Secondary | ICD-10-CM | POA: Diagnosis not present

## 2018-04-19 DIAGNOSIS — Z1231 Encounter for screening mammogram for malignant neoplasm of breast: Secondary | ICD-10-CM

## 2018-04-19 DIAGNOSIS — Z1501 Genetic susceptibility to malignant neoplasm of breast: Secondary | ICD-10-CM | POA: Insufficient documentation

## 2018-04-19 MED ORDER — GADOBENATE DIMEGLUMINE 529 MG/ML IV SOLN
20.0000 mL | Freq: Once | INTRAVENOUS | Status: AC | PRN
  Administered 2018-04-19: 20 mL via INTRAVENOUS

## 2018-04-20 ENCOUNTER — Telehealth: Payer: Self-pay | Admitting: Hematology and Oncology

## 2018-04-20 LAB — POCT I-STAT CREATININE: Creatinine, Ser: 0.8 mg/dL (ref 0.44–1.00)

## 2018-04-20 NOTE — Telephone Encounter (Signed)
I left a voicemail that the breast MRI was normal

## 2018-06-19 ENCOUNTER — Telehealth: Payer: Self-pay | Admitting: *Deleted

## 2018-06-19 NOTE — Telephone Encounter (Signed)
Called the patient's group home and spoke with April. Gave the appt for September 19th

## 2018-06-30 IMAGING — CT CT IMAGE GUIDED DRAINAGE BY PERCUTANEOUS CATHETER
1 of 3 series · 11 of 32 positions shown, 17 images · non-contrast
Comparison: none

INDICATION: 55-year-old female with a postoperative pelvic fluid collection in
the anatomic pelvis. Differential considerations include hematoma,
abscess and urinoma. She presents for CT-guided drain placement.
TECHNIQUE: Informed written consent was obtained from the patient after a
thorough discussion of the procedural risks, benefits and
alternatives. All questions were addressed. Maximal Sterile Barrier
Technique was utilized including caps, mask, sterile gowns, sterile
gloves, sterile drape, hand hygiene and skin antiseptic. A timeout
was performed prior to the initiation of the procedure.

[Series 2: i-spiral 5.0 b31f · axial · 0.95mm/px · z∈[-458,-304]mm · 11 of 54 slices shown, 17 images]
[im 5/54  soft-tissue]
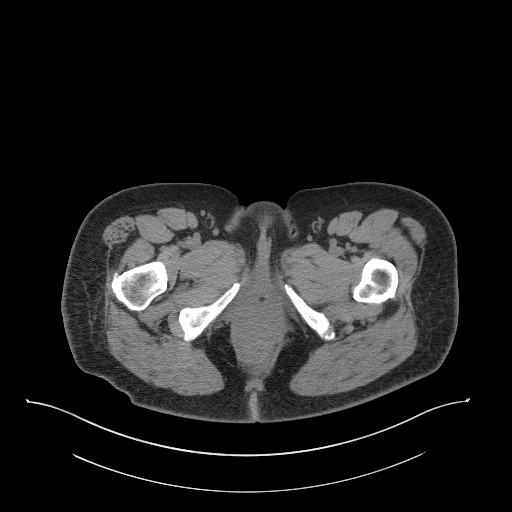
[im 5/54  bone]
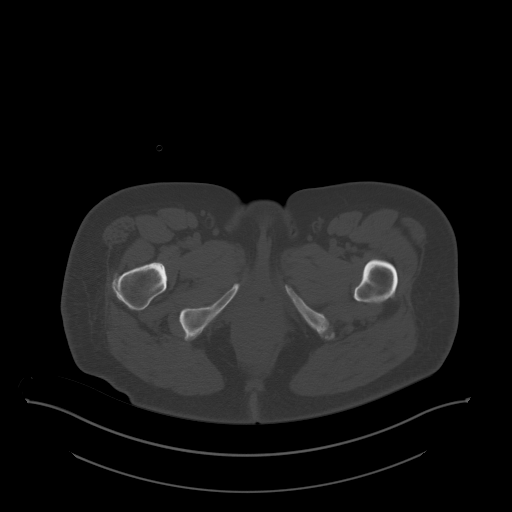
[im 9/54  soft-tissue]
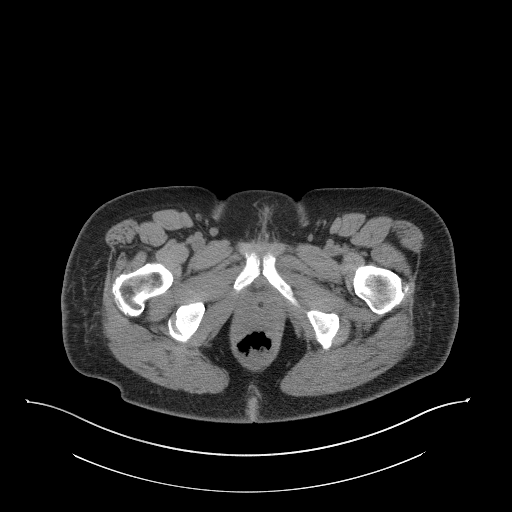
[im 14/54  soft-tissue]
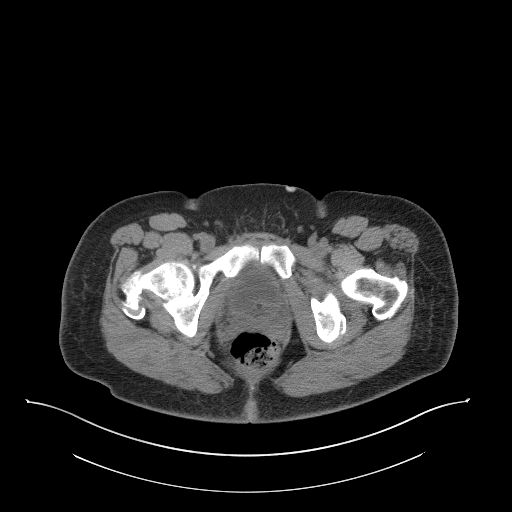
[im 18/54  soft-tissue]
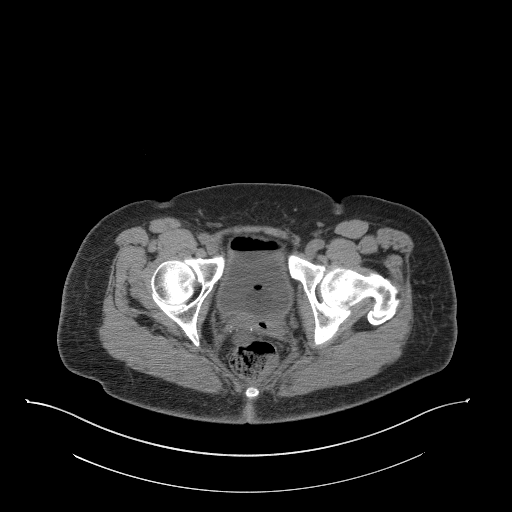
[im 23/54  soft-tissue]
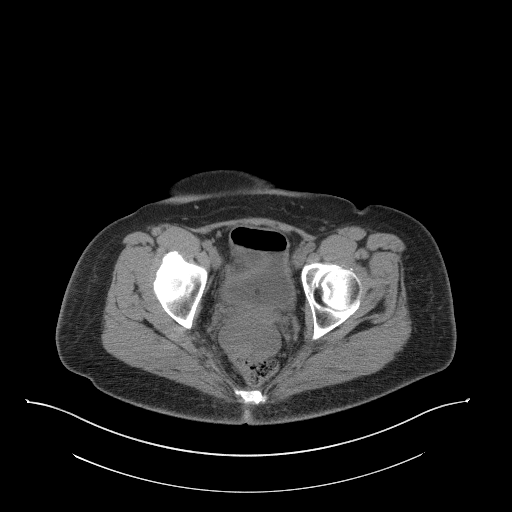
[im 27/54  soft-tissue]
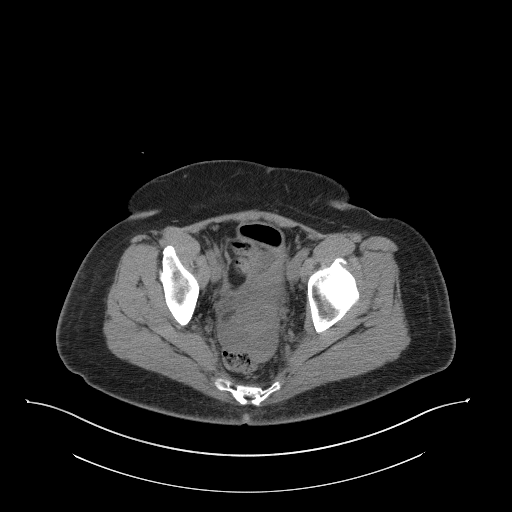
[im 31/54  soft-tissue]
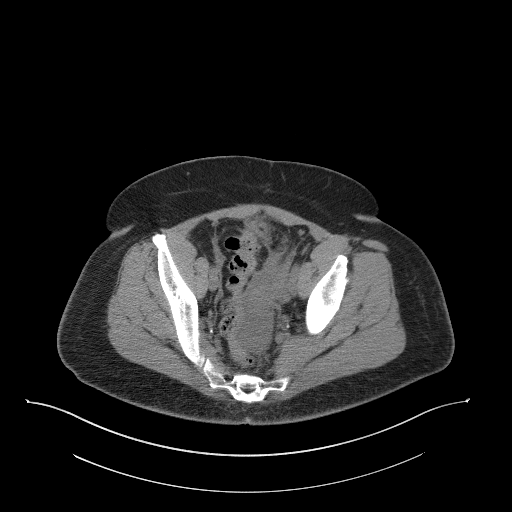
[im 36/54  soft-tissue]
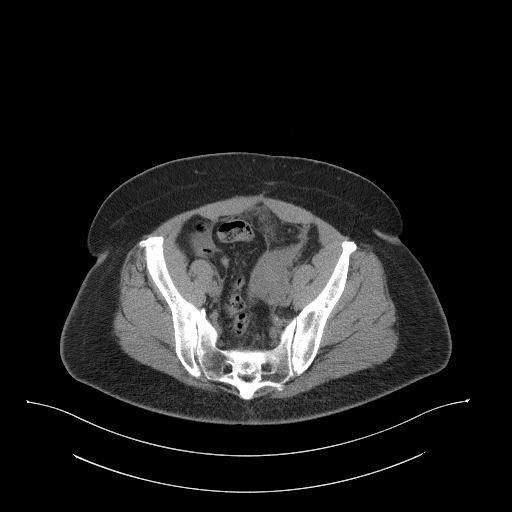
[im 36/54  lung]
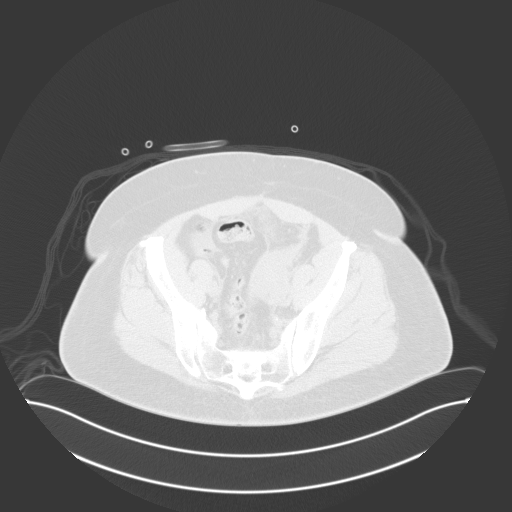
[im 40/54  soft-tissue]
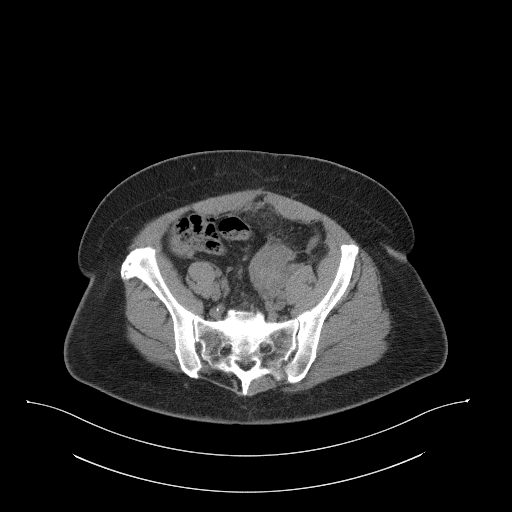
[im 40/54  lung]
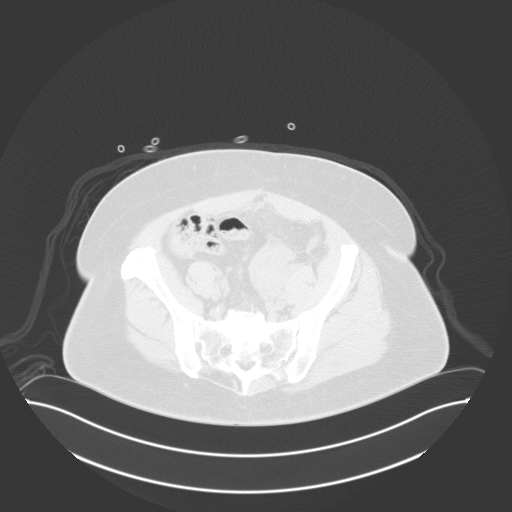
[im 40/54  bone]
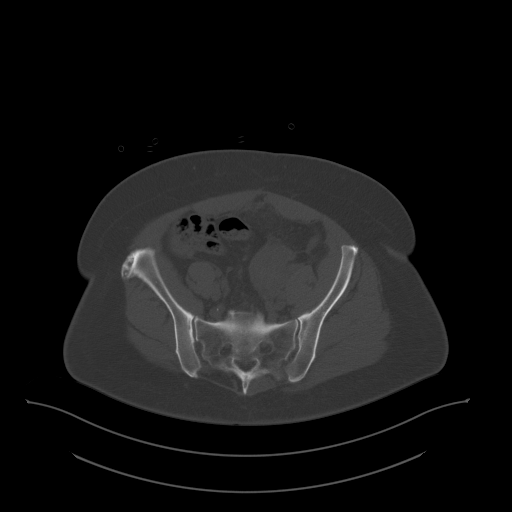
[im 45/54  soft-tissue]
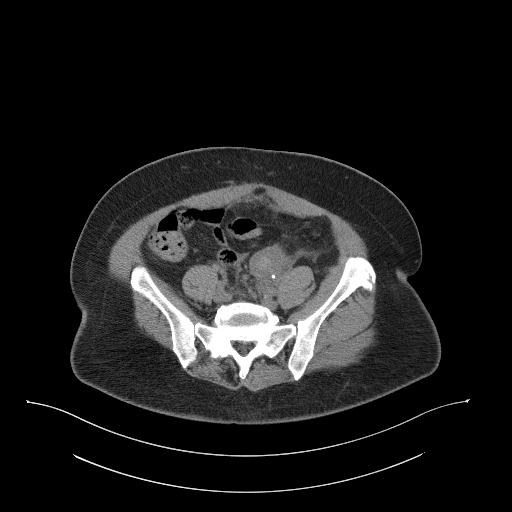
[im 45/54  lung]
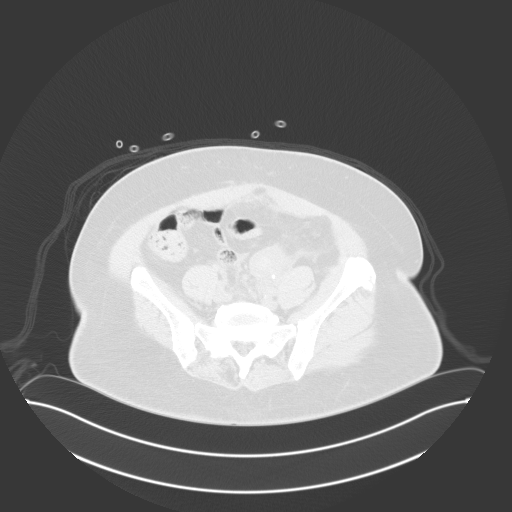
[im 49/54  soft-tissue]
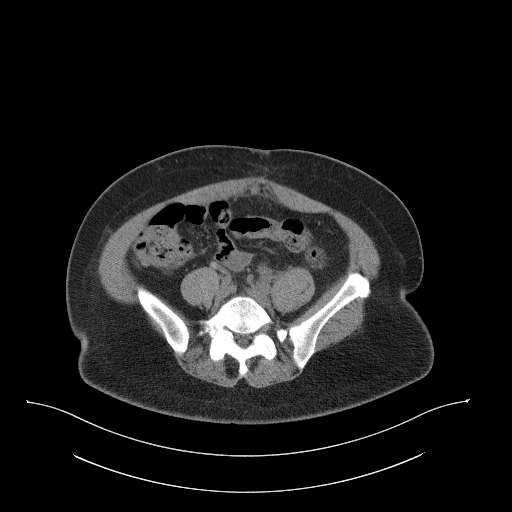
[im 49/54  lung]
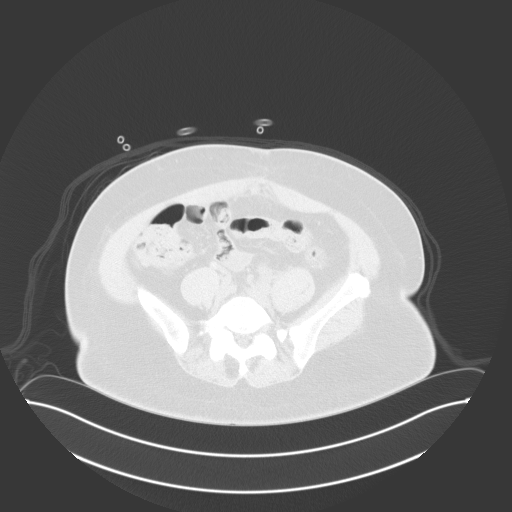

[11 of 32 positions shown; findings below may reference images not displayed]

EXAM:
CT GUIDED DRAINAGE OF pelvic ABSCESS

MEDICATIONS:
The patient is currently admitted to the hospital and receiving
intravenous antibiotics. The antibiotics were administered within an
appropriate time frame prior to the initiation of the procedure.

ANESTHESIA/SEDATION:
2 mg IV Versed 100 mcg IV Fentanyl

Moderate Sedation Time:  24 minutes

The patient was continuously monitored during the procedure by the
interventional radiology nurse under my direct supervision.

COMPLICATIONS:
None immediate.
PROCEDURE:
The operative field was prepped with Chlorhexidine in a sterile
fashion, and a sterile drape was applied covering the operative
field. A sterile gown and sterile gloves were used for the
procedure. Local anesthesia was provided with 1% Lidocaine. A
planning axial CT scan was performed. A suitable skin entry site was
selected and marked. Local anesthesia was attained by infiltration
with 1% lidocaine. A small dermatotomy was made. Under intermittent
CT guidance, an 18 gauge trocar needle was advanced of the complex
fluid collection in the left anatomic pelvis. A [DATE] wire was then
advanced inferiorly into the pelvic cul-de-sac. The skin tract was
serially dilated to 12 French and [REDACTED] French all-purpose
drainage catheter modified with additional sideholes was advanced
over the wire and formed. Aspiration yields approximately 60 mL of
the thin reddish brown fluid.

The drain was secured to the skin with 0 Prolene suture. Follow-up
axial CT imaging demonstrates a well-positioned drainage catheter
with significant improvement in the size of the complex fluid
collection. No evidence of immediate complication.
FINDINGS: 80 mL thin reddish brown fluid most consistent with liquified
hematoma.
IMPRESSION: Successful placement of a 12 French all-purpose drainage catheter
modified with additional sideholes into the complex pelvic fluid
collection via a left lower quadrant anterolateral approach.

Aspiration yields 80 mL of liquified hematoma. Samples were sent for
culture and creatinine as requested.

PLAN:
Maintain tube to JP bulb suction and flush 2-3 times daily. When
output has been less than 10 mL for 2 days consecutively, recommend
repeat CT scan of the pelvis with intravenous contrast prior to
drain removal.

## 2018-08-30 ENCOUNTER — Inpatient Hospital Stay: Payer: Medicaid Other | Attending: Gynecologic Oncology | Admitting: Gynecologic Oncology

## 2018-08-30 ENCOUNTER — Encounter: Payer: Self-pay | Admitting: Gynecologic Oncology

## 2018-08-30 ENCOUNTER — Inpatient Hospital Stay: Payer: Medicaid Other

## 2018-08-30 VITALS — BP 126/73 | HR 80 | Temp 97.8°F | Resp 18 | Ht 64.0 in | Wt 170.0 lb

## 2018-08-30 DIAGNOSIS — Z1509 Genetic susceptibility to other malignant neoplasm: Secondary | ICD-10-CM

## 2018-08-30 DIAGNOSIS — Z87891 Personal history of nicotine dependence: Secondary | ICD-10-CM | POA: Diagnosis not present

## 2018-08-30 DIAGNOSIS — Z853 Personal history of malignant neoplasm of breast: Secondary | ICD-10-CM | POA: Diagnosis not present

## 2018-08-30 DIAGNOSIS — Z90722 Acquired absence of ovaries, bilateral: Secondary | ICD-10-CM

## 2018-08-30 DIAGNOSIS — Z1502 Genetic susceptibility to malignant neoplasm of ovary: Secondary | ICD-10-CM | POA: Insufficient documentation

## 2018-08-30 DIAGNOSIS — Z1501 Genetic susceptibility to malignant neoplasm of breast: Secondary | ICD-10-CM

## 2018-08-30 DIAGNOSIS — Z90710 Acquired absence of both cervix and uterus: Secondary | ICD-10-CM | POA: Diagnosis not present

## 2018-08-30 DIAGNOSIS — Z9221 Personal history of antineoplastic chemotherapy: Secondary | ICD-10-CM | POA: Diagnosis not present

## 2018-08-30 DIAGNOSIS — Z923 Personal history of irradiation: Secondary | ICD-10-CM | POA: Diagnosis not present

## 2018-08-30 NOTE — Progress Notes (Signed)
GYN ONCOLOGY OFFICE VISIT   Referring Clinician:  Dr. Bard Herbert 57 y.o. female  CC:   BRCA mutation Positive c.[id number] (p.[id number]).  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 is a 57 y.o. 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 Additionally, 1 VUS, called "c.16A>C (p.Thr6Pro)" was found in one copy of the MSH6 gene.  Adnexa additionally are normal on UTZ and CA 125  Is wnl.  There is a remote history of hysterectomy.  On June 01, 2017 she underwent robotic-assisted bilateral salpingo-oophorectomy with washings. Path without malignancy neoplasia or dysplasia.  Post of seroma required drainage.  Now doing well. Follow-up with Gyn Onc in 6 months Follow-up with Dr. Olivia Mackie  As scheduled  Check CA125 today  HPI: [name] is a 56 y.o.   5 para 5 with a diagnosis of a right upper outer quadrant breast mass in December 2016. She subsequently has undergone lumpectomy and adjuvant chemotherapy Adriamycin and Cytoxan followed by radiation therapy which was completed in September 2017. 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 Additionally, 1 VUS, called "c.16A>C (p.Thr6Pro)" was found in one copy of the MSH6 gene .  Alison [name]  Has a diagnosis of schizophrenia and defers to her mother for her medical history. They  report NSVD 4, one cesarean section and a bilateral tubal ligation. Family history is notable for mother with breast cancer diagnosed at the age of 69 a maternal aunt with ovarian cancer diagnosed in his 32s a maternal cousin diagnosed with breast cancer at an unspecified age. There are 2 maternal uncles with prostate cancer diagnosed at an unspecified age and a brother with vocal cord cancer.   Alison [name] is unclear about when she became menopausal.   Review of the medical  record is notable for a note in 2015 within the past surgical history documentation of a laparoscopic vaginal hysterectomy. An MRI of the hip in 2007 notes that the uterus is not visualized.  Pelvic ultrasound 03/21/2017 confirms the absence of a uterus.  Left ovary 2.2x1.0x1.8 with a 90m calcification along the edge R ovary 3.0x1.3x2.2cm .  No free fluid.  CA 125 03/2017 9.9   02/08/18 14.0  S/P Robotic assisted LSC BSO 06/01/2017. Path 1. Ovary and fallopian tube, right - NO DIAGNOSTIC ABNORMALITY 2. Ovary and fallopian tube, left - NO DIAGNOSTIC ABNORMALITY Cytology without malignant cells.  Doing well, without complaints. No bloating or abdominal discomfort.  Review of Systems:  Constitutional  Feels well,  Cardiovascular  No chest pain, shortness of breath, or edema  Pulmonary  No cough or wheeze.  Gastro Intestinal  No nausea, vomitting, or diarrhoea. No bright red blood per rectum, no abdominal pain, change in bowel movement, or constipation. No abdominal bloating, excellent appetite Genito Urinary  No frequency, urgency, dysuria, no vaginal bleeding or discharge Psychology  No depression, anxiety, insomnia.    Social Hx:   Social History   Socioeconomic History  . Marital status: Single    Spouse name: Not on file  . Number of children: Not on file  . Years of education: Not on file  . Highest education level: Not on file  Occupational History  . Not on file  Social Needs  . Financial resource strain: Not on file  . Food insecurity:    Worry: Not on file    Inability: Not on file  . Transportation needs:  Medical: Not on file    Non-medical: Not on file  Tobacco Use  . Smoking status: Former Smoker    Packs/day: 0.25    Types: Cigarettes    Last attempt to quit: 12/2015    Years since quitting: 2.7  . Smokeless tobacco: Never Used  Substance and Sexual Activity  . Alcohol use: Yes    Comment: "only on special occasions"   . Drug use: No  . Sexual  activity: Yes    Birth control/protection: Surgical  Lifestyle  . Physical activity:    Days per week: Not on file    Minutes per session: Not on file  . Stress: Not on file  Relationships  . Social connections:    Talks on phone: Not on file    Gets together: Not on file    Attends religious service: Not on file    Active member of club or organization: Not on file    Attends meetings of clubs or organizations: Not on file    Relationship status: Not on file  . Intimate partner violence:    Fear of current or ex partner: Not on file    Emotionally abused: Not on file    Physically abused: Not on file    Forced sexual activity: Not on file  Other Topics Concern  . Not on file  Social History Narrative  . Not on file  Came here alone with transportation services  Past Surgical Hx:  Past Surgical History:  Procedure Laterality Date  . ABDOMINAL HYSTERECTOMY    . BREAST LUMPECTOMY Right 2017  . BREAST LUMPECTOMY WITH RADIOACTIVE SEED AND SENTINEL LYMPH NODE BIOPSY Right 01/20/2016   Procedure: RIGHT BREAST LUMPECTOMY WITH RADIOACTIVE SEED AND SENTINEL LYMPH NODE BIOPSY;  Surgeon: Coralie Keens, MD;  Location: [facility];  Service: General;  Laterality: Right;  . CHOLECYSTECTOMY N/A 04/16/2014   Procedure: LAPAROSCOPIC CHOLECYSTECTOMY ;  Surgeon: [name]. Georgette Dover, MD;  Location: [facility];  Service: General;  Laterality: N/A;  . DEBULKING N/A 06/01/2017   Procedure: POSSIBLE DEBULKING;  Surgeon: Janie Morning, MD;  Location: [facility];  Service: Gynecology;  Laterality: N/A;  . IR RADIOLOGIST EVAL & MGMT  06/27/2017  . PORT-A-CATH REMOVAL Left 10/13/2016   Procedure: REMOVAL PORT-A-CATH;  Surgeon: Coralie Keens, MD;  Location: [facility];  Service: General;  Laterality: Left;  . PORTACATH PLACEMENT Left 01/20/2016   Procedure: INSERTION PORT-A-CATH;  Surgeon: Coralie Keens, MD;  Location: [facility];  Service: General;  Laterality: Left;  . ROBOTIC ASSISTED  BILATERAL SALPINGO OOPHERECTOMY N/A 06/01/2017   Procedure: XI ROBOTIC ASSISTED BILATERAL SALPINGO OOPHORECTOMY AND PELVIC WASHINGS;  Surgeon: Janie Morning, MD;  Location: [facility];  Service: Gynecology;  Laterality: N/A;    Past Medical Hx:  Past Medical History:  Diagnosis Date  . Anemia   . Cancer ([place])   . Depression    chronic   . Diabetes mellitus without complication ([place])    type II  . Gallstones   . GERD (gastroesophageal reflux disease)   . History of radiation therapy 07/25/16- 09/05/16   Right Breast  . Hyperlipidemia   . Hypertension   . Personal history of chemotherapy   . Personal history of radiation therapy   . Schizophrenia ([place])   Bilateral cataracts   Past Gynecological History: Gravida 5 para 5 unknown dates of menarche or menopause. Reports prior bilateral tubal ligation, reports a normal Pap test last year and denies any history of abnormal Pap tests  No LMP recorded. Patient has had a hysterectomy.  Family Hx:  Family History  Problem Relation Age of Onset  . Breast cancer Mother 68       s/p partial mastectomy  . Skin cancer Mother        on right knee; unspecified type  . Colon polyps Mother        three total  . Other Mother 76       history of a TAH-BSO for bleeding  . Prostate cancer Maternal Uncle        (x2 maternal uncles)  . Cirrhosis Maternal Grandfather        +EtOH  . Cancer Brother 55       vocal cord ca; not a smoker  . Autism Brother   . Cancer Maternal Uncle        vocal cord ca; +smoker  . Ovarian cancer Maternal Aunt        dx. >50  . Cancer Maternal Aunt        unspecified type  . Diabetes Maternal Aunt   . Colon cancer Cousin        (x2 maternal first cousins--sisters)  . Breast cancer Cousin        dx. 81 or younger  . Cancer Other        NOS cancer; lim info; all three of his daughters had cancer as well  . Breast cancer Other     Vitals:  Blood pressure 126/73, pulse 80, temperature 97.8 F (36.6 C), temperature  source Oral, resp. rate 18, height _0  (1.626 m), weight 170 lb (77.1 kg), SpO2 100 %.  Physical Exam: WD in NAD Neck  Supple NROM, without any enlargements, sclera nonicteric.  Skin  No rash/lesions/breakdown  Psychiatry  Alert and oriented upbeat  mood affect slow speech Abdomen  Normoactive bowel sounds, abdomen soft, non-tender. Midline vertical incision intact without evidence of hernia. Port sites intact.  No hernia, no masses. Back No CVA tenderness Pelvic: Normal external genitalia Bartholin's urethra. No pelvic or vaginal masses Rectum: Good tone no masses no nodularity.  Extremities  No bilateral cyanosis, clubbing or edema.   Janie Morning, MD, PhD 08/30/2018, 9:56 AM

## 2018-08-30 NOTE — Patient Instructions (Signed)
Follow up in 6 months- call our office in January to schedule appointment here in March 2020. CA-125 lab today and we'll call you with the results. Call our office for any additional questions or concerns before your next appointment.

## 2018-08-31 ENCOUNTER — Telehealth: Payer: Self-pay | Admitting: *Deleted

## 2018-08-31 LAB — CA 125: Cancer Antigen (CA) 125: 8.4 U/mL (ref 0.0–38.1)

## 2018-08-31 NOTE — Telephone Encounter (Signed)
I called the patient to tell her the results of her CA 125.  I told the patient per Joylene John, NP that her CA 125 was 8.4 which is normal.  Patient verbalized understanding.

## 2019-01-15 ENCOUNTER — Other Ambulatory Visit: Payer: Self-pay | Admitting: Hematology and Oncology

## 2019-01-15 DIAGNOSIS — Z853 Personal history of malignant neoplasm of breast: Secondary | ICD-10-CM

## 2019-02-15 NOTE — Progress Notes (Signed)
Patient Care Team: Benito Mccreedy, MD as PCP - General (Internal Medicine)  DIAGNOSIS:    ICD-10-CM   1. Malignant neoplasm of upper-outer quadrant of right breast in female, estrogen receptor negative ([place]) C50.411    Z17.1     SUMMARY OF ONCOLOGIC HISTORY:   Breast cancer of upper-outer quadrant of right female breast ([place])   11/25/2015 Mammogram    Right breast mass 12 cm from the nipple: 1.4 x 1.3 x 0.9 cm, two right axillary lymph nodes with borderline cortical thickening measuring up to 3 mm    12/04/2015 Initial Diagnosis    Right breast biopsy 10:00: Invasive ductal carcinoma with DCIS, grade 2, ER 0%, PR 0%, HER-2 negative, ratio1.31, Ki-67 40%    01/13/2016 Procedure    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 Additionally, 1 VUS, called "c.16A>C (p.Thr6Pro)" was found in one copy of the MSH6 gene. Genes analyzed: ATM, BARD1, BRCA1, BRCA2, BRIP1, CDH1, CHEK2, FANCC, MLH1, MSH2, MSH6, NBN, PALB2, PMS2, PTEN, RAD51C, RAD51D, TP53, and XRCC2.  This panel also includes deletion/duplication analysis (without sequencing) for one gene, EPCAM    01/20/2016 Surgery    Rt Lumpectomy Ninfa Linden): IDC 1.7 cm, grade 3, with DCIS, 0/2 LN, ER 0%, PR 0%, Her 2 Neg Ratio 1.35, Ki 67: 40%, T1CN0 (stage 1A)    02/11/2016 - 06/30/2016 Chemotherapy    Adjuvant chemotherapy with dose dense Adriamycin and Cytoxan 4 followed by Abraxane weekly 12    07/25/2016 - 09/05/2016 Radiation Therapy    Adjuvant radiation therapy Isidore Moos). Right breast / 50 Gy in 25 fractions.  Right breast boost / 10 Gy in 5 fractions    06/01/2017 Surgery    Pelvic abscess: S/P Bil Salpingo Oophorectomy; No cancer     CHIEF COMPLIANT: Surveillance of breast cancer  INTERVAL HISTORY: Alison [name] is a 58 y.o. 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 Her most recent breast MRI on 04/19/18 showed no evidence of malignancy bilaterally. She presents to the clinic alone today and is doing well. She reports normal appetite and energy and denies any tingling or numbness of her hands and feet. She denies any ain or discomfort in her breasts. She exercises regularly.   REVIEW OF SYSTEMS:   Constitutional: Denies fevers, chills or abnormal weight loss Eyes: Denies blurriness of vision Ears, nose, mouth, throat, and face: Denies mucositis or sore throat Respiratory: Denies cough, dyspnea or wheezes Cardiovascular: Denies palpitation, chest discomfort Gastrointestinal: Denies nausea, heartburn or change in bowel habits Skin: Denies abnormal skin rashes Lymphatics: Denies new lymphadenopathy or easy bruising Neurological: Denies numbness, tingling or new weaknesses Behavioral/Psych: Mood is stable, no new changes  Extremities: No lower extremity edema Breast: denies any pain or lumps or nodules in either breasts All other systems were reviewed with the patient and are negative.  I have reviewed the past medical history, past surgical history, social history and family history with the patient and they are unchanged from previous note.  ALLERGIES:  has No Known Allergies.  MEDICATIONS:  Current Outpatient Medications  Medication Sig Dispense Refill  . benztropine (COGENTIN) 0.5 MG tablet Take 0.5 mg by mouth 2 (two) times daily.    . carvedilol (COREG) 3.125 MG tablet TAKE 1 TABLET BY MOUTH TWICE DAILY. 60 tablet 3  . cromolyn (OPTICROM) 4 % ophthalmic solution Place 1 drop into both eyes 4 (four) times daily.    Marland Kitchen  cycloSPORINE (RESTASIS) 0.05 % ophthalmic emulsion Place 1 drop into both eyes 2 (two) times daily.    . divalproex (DEPAKOTE ER) 500 MG 24 hr tablet Take 1,000 mg by mouth at bedtime.    . furosemide (LASIX) 40 MG tablet Take 40 mg by mouth every morning.    Marland Kitchen lisinopril (PRINIVIL,ZESTRIL) 10 MG tablet Take 10 mg by mouth  every morning.     . metFORMIN (GLUCOPHAGE) 500 MG tablet Take 500 mg by mouth 2 (two) times daily with a meal.     . OLANZapine (ZYPREXA) 10 MG tablet Take 10 mg by mouth at bedtime.    Marland Kitchen omeprazole (PRILOSEC) 20 MG capsule Take 40 mg by mouth every morning.    . simvastatin (ZOCOR) 10 MG tablet Take 10 mg by mouth at bedtime.     No current facility-administered medications for this visit.    Facility-Administered Medications Ordered in Other Visits  Medication Dose Route Frequency Provider Last Rate Last Dose  . 0.9 %  sodium chloride infusion   Intravenous Once Nicholas Lose, MD      . sodium chloride flush (NS) 0.9 % injection 10 mL  10 mL Intracatheter PRN Nicholas Lose, MD   10 mL at 04/14/16 1127    PHYSICAL EXAMINATION: ECOG PERFORMANCE STATUS: 0 - Asymptomatic  Vitals:   02/19/19 0824  BP: 120/89  Pulse: 96  Resp: 17  Temp: 98.4 F (36.9 C)  SpO2: 98%   Filed Weights   02/19/19 0824  Weight: 175 lb 3.2 oz (79.5 kg)    GENERAL: alert, no distress and comfortable SKIN: skin color, texture, turgor are normal, no rashes or significant lesions EYES: normal, Conjunctiva are pink and non-injected, sclera clear OROPHARYNX: no exudate, no erythema and lips, buccal mucosa, and tongue normal  NECK: supple, thyroid normal size, non-tender, without nodularity LYMPH: no palpable lymphadenopathy in the cervical, axillary or inguinal LUNGS: clear to auscultation and percussion with normal breathing effort HEART: regular rate & rhythm and no murmurs and no lower extremity edema ABDOMEN: abdomen soft, non-tender and normal bowel sounds MUSCULOSKELETAL: no cyanosis of digits and no clubbing  NEURO: alert & oriented x 3 with fluent speech, no focal motor/sensory deficits EXTREMITIES: No lower extremity edema BREAST: No palpable masses or nodules in either right or left breasts. No palpable axillary supraclavicular or infraclavicular adenopathy no breast tenderness or nipple  discharge. (exam performed in the presence of a chaperone)  LABORATORY DATA:  I have reviewed the data as listed CMP Latest Ref Rng & Units 04/19/2018 06/14/2017 05/25/2017  Glucose 65 - 99 mg/dL - 106(H) 148(H)  BUN 6 - 20 mg/dL - 10 12  Creatinine 0.44 - 1.00 mg/dL 0.80 0.68 0.80  Sodium 135 - 145 mmol/L - 140 143  Potassium 3.5 - 5.1 mmol/L - 3.9 3.8  Chloride 101 - 111 mmol/L - 103 104  CO2 22 - 32 mmol/L - 26 29  Calcium 8.9 - 10.3 mg/dL - 8.9 9.0  Total Protein 6.5 - 8.1 g/dL - 7.5 7.0  Total Bilirubin 0.3 - 1.2 mg/dL - 0.5 0.3  Alkaline Phos 38 - 126 U/L - 87 70  AST 15 - 41 U/L - 23 28  ALT 14 - 54 U/L - 23 33    Lab Results  Component Value Date   WBC 5.6 06/14/2017   HGB 9.9 (L) 06/14/2017   HCT 29.7 (L) 06/14/2017   MCV 85.3 06/14/2017   PLT 327 06/14/2017   NEUTROABS 3.8 06/14/2017  ASSESSMENT & PLAN:  Breast cancer of upper-outer quadrant of right female breast ([place]) Rt Lumpectomy 01/20/16: IDC 1.7 cm, grade 3, with DCIS, 0/2 LN, ER 0%, PR 0%, Her 2 Neg Ratio 1.35, Ki 67: 40%, T1CN0 (stage 1A) (patient has mental handicap)  BRCA2 MutationPositive c.[id number] (p.[id number])".Additionally, one variant of uncertain significance, called "c.16A>C (p.Thr6Pro)" was found in one copy of the MSH6gene. 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 Adjuvant chemotherapy (Adriamycin and Cytoxan dose dense 4 followed by Abraxane weekly 12) started 02/11/2016 completed 06/30/2016 2. Followed by radiation started 07/25/2016 completed 09/05/2016 3. Bilateral salpingo-oophorectomy 06/01/2017 when she presented with suspected pelvic abscess: No cancer  Surveillance: 1. Mammograms 02/22/2018: Benign breast density category B. 2. Breast exam: 02/19/2019: Benign 3.Breast MRI May 2019: Benign 4. Breast ultrasound axilla: Benign fat necrosis 09/19/2017  Return to  clinic in 1 year for follow-up    No orders of the defined types were placed in this encounter.  The patient has a good understanding of the overall plan. she agrees with it. she will call with any problems that may develop before the next visit here.  Nicholas Lose, MD 02/19/2019  Julious Oka Dorshimer am acting as scribe for Dr. Nicholas Lose.  I have reviewed the above documentation for accuracy and completeness, and I agree with the above.

## 2019-02-19 ENCOUNTER — Inpatient Hospital Stay: Payer: Medicaid Other | Attending: Hematology and Oncology | Admitting: Hematology and Oncology

## 2019-02-19 ENCOUNTER — Telehealth: Payer: Self-pay | Admitting: Hematology and Oncology

## 2019-02-19 DIAGNOSIS — Z853 Personal history of malignant neoplasm of breast: Secondary | ICD-10-CM | POA: Insufficient documentation

## 2019-02-19 DIAGNOSIS — E119 Type 2 diabetes mellitus without complications: Secondary | ICD-10-CM

## 2019-02-19 DIAGNOSIS — Z9221 Personal history of antineoplastic chemotherapy: Secondary | ICD-10-CM

## 2019-02-19 DIAGNOSIS — Z79899 Other long term (current) drug therapy: Secondary | ICD-10-CM | POA: Insufficient documentation

## 2019-02-19 DIAGNOSIS — Z7984 Long term (current) use of oral hypoglycemic drugs: Secondary | ICD-10-CM

## 2019-02-19 DIAGNOSIS — C50411 Malignant neoplasm of upper-outer quadrant of right female breast: Secondary | ICD-10-CM

## 2019-02-19 DIAGNOSIS — Z171 Estrogen receptor negative status [ER-]: Secondary | ICD-10-CM

## 2019-02-19 DIAGNOSIS — Z923 Personal history of irradiation: Secondary | ICD-10-CM

## 2019-02-19 NOTE — Telephone Encounter (Signed)
Gave avs and calendar ° °

## 2019-02-19 NOTE — Assessment & Plan Note (Signed)
Rt Lumpectomy 01/20/16: IDC 1.7 cm, grade 3, with DCIS, 0/2 LN, ER 0%, PR 0%, Her 2 Neg Ratio 1.35, Ki 67: 40%, T1CN0 (stage 1A) (patient has mental handicap)  BRCA2 MutationPositive c.[id number] (p.[id number])".Additionally, one variant of uncertain significance, called "c.16A>C (p.Thr6Pro)" was found in one copy of the MSH6gene. 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 Adjuvant chemotherapy (Adriamycin and Cytoxan dose dense 4 followed by Abraxane weekly 12) started 02/11/2016 completed 06/30/2016 2. Followed by radiation started 07/25/2016 completed 09/05/2016 3. Bilateral salpingo-oophorectomy 06/01/2017 when she presented with suspected pelvic abscess: No cancer  Surveillance: 1. Mammograms 02/22/2018: Benign breast density category B. 2. Breast exam: 02/19/2019: Benign 3.Breast MRI May 2019: Benign 4. Breast ultrasound axilla: Benign fat necrosis 09/19/2017  Return to clinic in 1 year for follow-up

## 2019-02-25 ENCOUNTER — Other Ambulatory Visit: Payer: Self-pay

## 2019-02-25 ENCOUNTER — Ambulatory Visit
Admission: RE | Admit: 2019-02-25 | Discharge: 2019-02-25 | Disposition: A | Discharge status: Home / Self Care | Payer: Medicaid Other | Source: Ambulatory Visit | Attending: Hematology and Oncology | Admitting: Hematology and Oncology

## 2019-02-25 DIAGNOSIS — Z853 Personal history of malignant neoplasm of breast: Secondary | ICD-10-CM

## 2020-01-23 ENCOUNTER — Other Ambulatory Visit: Payer: Self-pay | Admitting: Hematology and Oncology

## 2020-01-23 DIAGNOSIS — Z853 Personal history of malignant neoplasm of breast: Secondary | ICD-10-CM

## 2020-02-18 NOTE — Progress Notes (Signed)
Patient Care Team: Benito Mccreedy, MD as PCP - General (Internal Medicine)  DIAGNOSIS:    ICD-10-CM   1. Malignant neoplasm of upper-outer quadrant of right breast in female, estrogen receptor negative ([place])  C50.411 MR BREAST BILATERAL W WO CONTRAST INC CAD   Z17.1     SUMMARY OF ONCOLOGIC HISTORY: Oncology History  Breast cancer of upper-outer quadrant of right female breast ([place])  11/25/2015 Mammogram   Right breast mass 12 cm from the nipple: 1.4 x 1.3 x 0.9 cm, two right axillary lymph nodes with borderline cortical thickening measuring up to 3 mm   12/04/2015 Initial Diagnosis   Right breast biopsy 10:00: Invasive ductal carcinoma with DCIS, grade 2, ER 0%, PR 0%, HER-2 negative, ratio1.31, Ki-67 40%   01/13/2016 Procedure   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 Additionally, 1 VUS, called "c.16A>C (p.Thr6Pro)" was found in one copy of the MSH6 gene. Genes analyzed: ATM, BARD1, BRCA1, BRCA2, BRIP1, CDH1, CHEK2, FANCC, MLH1, MSH2, MSH6, NBN, PALB2, PMS2, PTEN, RAD51C, RAD51D, TP53, and XRCC2.  This panel also includes deletion/duplication analysis (without sequencing) for one gene, EPCAM   01/20/2016 Surgery   Rt Lumpectomy Ninfa Linden): IDC 1.7 cm, grade 3, with DCIS, 0/2 LN, ER 0%, PR 0%, Her 2 Neg Ratio 1.35, Ki 67: 40%, T1CN0 (stage 1A)   02/11/2016 - 06/30/2016 Chemotherapy   Adjuvant chemotherapy with dose dense Adriamycin and Cytoxan 4 followed by Abraxane weekly 12   07/25/2016 - 09/05/2016 Radiation Therapy   Adjuvant radiation therapy Isidore Moos). Right breast / 50 Gy in 25 fractions.  Right breast boost / 10 Gy in 5 fractions   06/01/2017 Surgery   Pelvic abscess: S/P Bil Salpingo Oophorectomy; No cancer     CHIEF COMPLIANT: Surveillance of breast cancer  INTERVAL HISTORY: Alison [name] is a 59 y.o. 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 Mammogram on 02/25/19 showed no evidence of malignancy bilaterally. She presents to the clinic today for annual follow-up.  She denies any lumps or nodules in the breast but below the right breast she felt a small palpable nodule.  ALLERGIES:  has No Known Allergies.  MEDICATIONS:  Current Outpatient Medications  Medication Sig Dispense Refill  . benztropine (COGENTIN) 0.5 MG tablet Take 0.5 mg by mouth 2 (two) times daily.    . carvedilol (COREG) 3.125 MG tablet TAKE 1 TABLET BY MOUTH TWICE DAILY. 60 tablet 3  . cromolyn (OPTICROM) 4 % ophthalmic solution Place 1 drop into both eyes 4 (four) times daily.    . cycloSPORINE (RESTASIS) 0.05 % ophthalmic emulsion Place 1 drop into both eyes 2 (two) times daily.    . divalproex (DEPAKOTE ER) 500 MG 24 hr tablet Take 1,000 mg by mouth at bedtime.    . furosemide (LASIX) 40 MG tablet Take 40 mg by mouth every morning.    Marland Kitchen lisinopril (PRINIVIL,ZESTRIL) 10 MG tablet Take 10 mg by mouth every morning.     . metFORMIN (GLUCOPHAGE) 500 MG tablet Take 500 mg by mouth 2 (two) times daily with a meal.     . OLANZapine (ZYPREXA) 10 MG tablet Take 10 mg by mouth at bedtime.    Marland Kitchen omeprazole (PRILOSEC) 20 MG capsule Take 40 mg by mouth every morning.    . simvastatin (ZOCOR) 10 MG tablet Take 10 mg by mouth at bedtime.     No current facility-administered medications for this visit.  PHYSICAL EXAMINATION: ECOG PERFORMANCE STATUS: 1 - Symptomatic but completely ambulatory  Vitals:   02/19/20 0809  BP: 127/89  Pulse: 93  Resp: 20  Temp: 97.8 F (36.6 C)  SpO2: 99%   Filed Weights   02/19/20 0809  Weight: 172 lb 11.2 oz (78.3 kg)    BREAST: No palpable masses or nodules in either right or left breasts. No palpable axillary supraclavicular or infraclavicular adenopathy no breast tenderness or nipple discharge. (exam performed in the presence of a chaperone)  LABORATORY DATA:  I have reviewed the data  as listed CMP Latest Ref Rng & Units 04/19/2018 06/14/2017 05/25/2017  Glucose 65 - 99 mg/dL - 106(H) 148(H)  BUN 6 - 20 mg/dL - 10 12  Creatinine 0.44 - 1.00 mg/dL 0.80 0.68 0.80  Sodium 135 - 145 mmol/L - 140 143  Potassium 3.5 - 5.1 mmol/L - 3.9 3.8  Chloride 101 - 111 mmol/L - 103 104  CO2 22 - 32 mmol/L - 26 29  Calcium 8.9 - 10.3 mg/dL - 8.9 9.0  Total Protein 6.5 - 8.1 g/dL - 7.5 7.0  Total Bilirubin 0.3 - 1.2 mg/dL - 0.5 0.3  Alkaline Phos 38 - 126 U/L - 87 70  AST 15 - 41 U/L - 23 28  ALT 14 - 54 U/L - 23 33    Lab Results  Component Value Date   WBC 5.6 06/14/2017   HGB 9.9 (L) 06/14/2017   HCT 29.7 (L) 06/14/2017   MCV 85.3 06/14/2017   PLT 327 06/14/2017   NEUTROABS 3.8 06/14/2017    ASSESSMENT & PLAN:  Breast cancer of upper-outer quadrant of right female breast ([facility]) Rt Lumpectomy 01/20/16: IDC 1.7 cm, grade 3, with DCIS, 0/2 LN, ER 0%, PR 0%, Her 2 Neg Ratio 1.35, Ki 67: 40%, T1CN0 (stage 1A) (patient has mental handicap)  BRCA2 MutationPositive c.[id number] (p.[id number])".Additionally, one variant of uncertain significance, called "c.16A>C (p.Thr6Pro)" was found in one copy of the MSH6gene. 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 Adjuvant chemotherapy (Adriamycin and Cytoxan dose dense 4 followed by Abraxane weekly 12) started 02/11/2016 completed 06/30/2016 2. Followed by radiation started 07/25/2016 completed 09/05/2016 3. Bilateral salpingo-oophorectomy 06/01/2017 when she presented with suspected pelvic abscess: No cancer  Surveillance: 1. Mammograms 02/25/2019: Benign breast density category B. 2. Breast exam: 02/19/2020: Benign 3.Breast MRI May 2019: Benign, patient will need breast MRI annually. 4.Breast ultrasound axilla: Benign fat necrosis 09/19/2017  Return to clinic in 1 year for follow-up    Orders Placed This Encounter    Procedures  . MR BREAST BILATERAL W WO CONTRAST INC CAD    Standing Status:   Future    Standing Expiration Date:   04/20/2021    Scheduling Instructions:     Patient wants it on Tuesday or Thursday    Order Specific Question:   ** REASON FOR EXAM (FREE TEXT)    Answer:   BRCA mutation    Order Specific Question:   If indicated for the ordered procedure, I authorize the administration of contrast media per Radiology protocol    Answer:   Yes    Order Specific Question:   What is the patient's sedation requirement?    Answer:   No Sedation    Order Specific Question:   Does the patient have a pacemaker or implanted devices?    Answer:     No    Order Specific Question:   Radiology Contrast Protocol - do NOT remove file path    Answer:   _0 [email or web address]    Order Specific Question:   Preferred imaging location?    Answer:   [facility] (table limit-550lbs)   The patient has a good understanding of the overall plan. she agrees with it. she will call with any problems that may develop before the next visit here.  Total time spent: 20 mins including face to face time and time spent for planning, charting and coordination of care  Nicholas Lose, MD 02/19/2020  I, Cloyde Reams Dorshimer, am acting as scribe for Dr. Nicholas Lose.  I have reviewed the above documentation for accuracy and completeness, and I agree with the above.

## 2020-02-19 ENCOUNTER — Inpatient Hospital Stay: Payer: Medicaid Other | Attending: Hematology and Oncology | Admitting: Hematology and Oncology

## 2020-02-19 ENCOUNTER — Telehealth: Payer: Self-pay | Admitting: Hematology and Oncology

## 2020-02-19 ENCOUNTER — Other Ambulatory Visit: Payer: Self-pay

## 2020-02-19 DIAGNOSIS — Z79899 Other long term (current) drug therapy: Secondary | ICD-10-CM | POA: Diagnosis not present

## 2020-02-19 DIAGNOSIS — Z171 Estrogen receptor negative status [ER-]: Secondary | ICD-10-CM | POA: Diagnosis not present

## 2020-02-19 DIAGNOSIS — N739 Female pelvic inflammatory disease, unspecified: Secondary | ICD-10-CM | POA: Diagnosis not present

## 2020-02-19 DIAGNOSIS — Z923 Personal history of irradiation: Secondary | ICD-10-CM | POA: Diagnosis not present

## 2020-02-19 DIAGNOSIS — C50411 Malignant neoplasm of upper-outer quadrant of right female breast: Secondary | ICD-10-CM | POA: Insufficient documentation

## 2020-02-19 DIAGNOSIS — Z9221 Personal history of antineoplastic chemotherapy: Secondary | ICD-10-CM | POA: Diagnosis not present

## 2020-02-19 NOTE — Assessment & Plan Note (Signed)
Rt Lumpectomy 01/20/16: IDC 1.7 cm, grade 3, with DCIS, 0/2 LN, ER 0%, PR 0%, Her 2 Neg Ratio 1.35, Ki 67: 40%, T1CN0 (stage 1A) (patient has mental handicap)  BRCA2 MutationPositive c.[id number] (p.Lys437IlefsX22)".Additionally, one variant of uncertain significance, called "c.16A>C (p.Thr6Pro)" was found in one copy of the MSH6gene. 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 Adjuvant chemotherapy (Adriamycin and Cytoxan dose dense 4 followed by Abraxane weekly 12) started 02/11/2016 completed 06/30/2016 2. Followed by radiation started 07/25/2016 completed 09/05/2016 3. Bilateral salpingo-oophorectomy 06/01/2017 when she presented with suspected pelvic abscess: No cancer  Surveillance: 1. Mammograms 02/25/2019: Benign breast density category B. 2. Breast exam: 02/19/2020: Benign 3.Breast MRI May 2019: Benign, patient will need breast MRI annually. 4.Breast ultrasound axilla: Benign fat necrosis 09/19/2017  Return to clinic in 1 year for follow-up

## 2020-02-19 NOTE — Telephone Encounter (Signed)
I could not reach patient regarding schedule  °

## 2020-02-26 ENCOUNTER — Inpatient Hospital Stay: Admission: RE | Admit: 2020-02-26 | Payer: Medicaid Other | Source: Ambulatory Visit

## 2020-03-18 ENCOUNTER — Other Ambulatory Visit: Payer: Self-pay

## 2020-03-18 ENCOUNTER — Ambulatory Visit
Admission: RE | Admit: 2020-03-18 | Discharge: 2020-03-18 | Disposition: A | Discharge status: Home / Self Care | Payer: Medicaid Other | Source: Ambulatory Visit | Attending: Hematology and Oncology | Admitting: Hematology and Oncology

## 2020-03-18 DIAGNOSIS — Z853 Personal history of malignant neoplasm of breast: Secondary | ICD-10-CM

## 2020-08-18 ENCOUNTER — Encounter (HOSPITAL_COMMUNITY): Payer: Self-pay | Admitting: Psychiatry

## 2020-08-18 ENCOUNTER — Telehealth (HOSPITAL_COMMUNITY): Payer: Self-pay | Admitting: *Deleted

## 2020-08-18 ENCOUNTER — Ambulatory Visit (INDEPENDENT_AMBULATORY_CARE_PROVIDER_SITE_OTHER): Payer: Medicaid Other | Admitting: Psychiatry

## 2020-08-18 ENCOUNTER — Other Ambulatory Visit: Payer: Self-pay

## 2020-08-18 DIAGNOSIS — F205 Residual schizophrenia: Secondary | ICD-10-CM

## 2020-08-18 DIAGNOSIS — G2401 Drug induced subacute dyskinesia: Secondary | ICD-10-CM | POA: Insufficient documentation

## 2020-08-18 MED ORDER — DIVALPROEX SODIUM ER 500 MG PO TB24: 1000.0000 mg | ORAL_TABLET | Freq: Every day | ORAL | 2 refills | Status: DC

## 2020-08-18 MED ORDER — AUSTEDO 12 MG PO TABS: 18.0000 mg | ORAL_TABLET | Freq: Two times a day (BID) | ORAL | 2 refills | Status: DC

## 2020-08-18 MED ORDER — OLANZAPINE 15 MG PO TABS: 15.0000 mg | ORAL_TABLET | Freq: Every day | ORAL | 2 refills | Status: DC
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
## 2020-08-18 NOTE — Telephone Encounter (Signed)
Call from April at [facility] re clarification of patients Austedo. Clarified to her with help from NP that patient is to follow the start pack instructions and on the fourth week she will be taking 15 mg BID and on the 5th week she will be taking 18 mg BID and that is anticipated to be where she maintains. Facility is to call if any issues arises.

## 2020-08-18 NOTE — Telephone Encounter (Signed)
A woman identifying herself as calling from [facility] is calling for clarification on dose of her Austedo. States the box and Therapist, art. Chart indicates 18 mg BID, but that sig doesn't match what woman thinks she understood as instructions. She thought she was to start out taking 12 mg a day. Will ask Brittney to clarify dose and writer will call woman back.

## 2020-08-18 NOTE — Telephone Encounter (Signed)
Patient called and prescriptions explained to patient. Patient was given a month sample of Austedo. She was told to take one pill twice daily. She was also informed that refills would be Austedo 18 mg BID. She endorsed understanding and agreed.

## 2020-08-25 ENCOUNTER — Other Ambulatory Visit: Payer: Medicaid Other

## 2020-08-26 ENCOUNTER — Telehealth (HOSPITAL_COMMUNITY): Payer: Self-pay | Admitting: *Deleted

## 2020-08-26 NOTE — Telephone Encounter (Signed)
Called [employer] to follow up with the prior authorization I did several days ago. [id number] end date 08/20/2021 approved 540 units. Called pharmacy to inform them she was approved.

## 2020-08-31 ENCOUNTER — Other Ambulatory Visit: Payer: Medicaid Other

## 2020-09-24 ENCOUNTER — Ambulatory Visit
Admission: RE | Admit: 2020-09-24 | Discharge: 2020-09-24 | Disposition: A | Discharge status: Home / Self Care | Payer: Medicaid Other | Source: Ambulatory Visit | Attending: Hematology and Oncology | Admitting: Hematology and Oncology

## 2020-09-24 DIAGNOSIS — Z171 Estrogen receptor negative status [ER-]: Secondary | ICD-10-CM

## 2020-09-24 DIAGNOSIS — C50411 Malignant neoplasm of upper-outer quadrant of right female breast: Secondary | ICD-10-CM

## 2020-09-24 MED ORDER — GADOBUTROL 1 MMOL/ML IV SOLN
7.0000 mL | Freq: Once | INTRAVENOUS | Status: AC | PRN
  Administered 2020-09-24: 7 mL via INTRAVENOUS

## 2020-09-25 ENCOUNTER — Telehealth: Payer: Self-pay | Admitting: Hematology and Oncology

## 2020-09-25 NOTE — Telephone Encounter (Signed)
I called the patient to give the results of the breast MRI which was normal.  The phone rang but i could not leave a voicemail.

## 2020-10-21 ENCOUNTER — Other Ambulatory Visit (HOSPITAL_COMMUNITY): Payer: Self-pay | Admitting: Psychiatry

## 2020-10-21 DIAGNOSIS — F205 Residual schizophrenia: Secondary | ICD-10-CM

## 2020-11-16 ENCOUNTER — Ambulatory Visit (HOSPITAL_COMMUNITY): Payer: Medicaid Other | Admitting: Psychiatry

## 2020-11-18 ENCOUNTER — Encounter (HOSPITAL_COMMUNITY): Payer: Self-pay | Admitting: Psychiatry

## 2020-11-18 ENCOUNTER — Other Ambulatory Visit: Payer: Self-pay

## 2020-11-18 ENCOUNTER — Ambulatory Visit (INDEPENDENT_AMBULATORY_CARE_PROVIDER_SITE_OTHER): Payer: Medicaid Other | Admitting: Psychiatry

## 2020-11-18 VITALS — BP 115/76 | HR 97 | Ht 64.0 in | Wt 159.0 lb

## 2020-11-18 DIAGNOSIS — F205 Residual schizophrenia: Secondary | ICD-10-CM

## 2020-11-18 DIAGNOSIS — G2401 Drug induced subacute dyskinesia: Secondary | ICD-10-CM

## 2020-11-18 MED ORDER — INGREZZA 80 MG PO CAPS: 80.0000 mg | ORAL_CAPSULE | Freq: Every day | ORAL | 2 refills | Status: DC

## 2020-11-18 MED ORDER — INGREZZA 40 MG PO CAPS: 40.0000 mg | ORAL_CAPSULE | Freq: Every day | ORAL | 0 refills | Status: DC

## 2020-11-18 MED ORDER — DIVALPROEX SODIUM ER 500 MG PO TB24: ORAL_TABLET | ORAL | 2 refills | Status: DC

## 2020-11-18 MED ORDER — BENZTROPINE MESYLATE 1 MG PO TABS: 1.0000 mg | ORAL_TABLET | Freq: Every day | ORAL | 2 refills | Status: DC

## 2020-11-18 MED ORDER — OLANZAPINE 15 MG PO TABS: ORAL_TABLET | ORAL | 2 refills | Status: DC

## 2020-11-18 NOTE — Progress Notes (Signed)
BH MD/PA/NP OP Progress Note  11/18/2020 9:22 AM Alison [name]  MRN:  [id number]  Chief Complaint:"I can notice the shaking but it doesn't bother me to much"  Chief Complaint    Medication Management     HPI: 59 year old female seen today for follow up psychiatric evaluation. She has a psychiatric history of schizophrenia and is currently being managed on Depakote 1000 mg daily, Zyprexa 15 mg daily, and Austedo 18 mg twice daily. Patient was [facility] with [facility] and noted that while on Austedo patient TD did not improve noting that she was more zoned out and drolled. Facility staff and patient noted that she was taken to her primary care physician and placed back on Cogentin 1mg  twice daily. Patient informed provider that she is however still shaking.  Today patient is well-groomed, pleasant, cooperative, engaged in conversation, and maintained eye contact. She denied anxiety and depression. She noted however that at times she becomes irritable with some of her peers that live with her however noted that she is able to cope with her irritability. She also informed provider that at times she worries about her children because she does not know where they are. She informed provider that her children were taken away when they were younger and she has not seen them since. She denies SI/HI/VAH or paranoia.  Patient informed provider that since being off of Austedo she no longer drools however noted Cogentin has not been effective in managing her TD. Provider informed patient that Cogentin has anticholinergic effects and may be the cause of why she is not drooling. She endorsed understanding. Provider conducted an aims assessment and patient scored an 8.  Today patient is agreeable to reduce Cogentin 1 mg twice daily to 1 mg daily. She is also agreeable to start Ingrezza 40 mg for a week and then increase to 80 mg. Patient given a 40 mg week supply of Ingrezza. Provider placed an order  for 80 mg to be filled by her pharmacy. Per facility staff patient's pharmacy (express care requires a written order for Ingrezza 40 in order for the [facility] to be correct). Provider sent electronic order however informed pharmacy not to fill 40 mg as patient was given a week sample. Potential side effects of medication and risks vs benefits of treatment vs non-treatment were explained and discussed. All questions were answered.She will continue all other medications as prescribed. No other concerns noted at this time. Visit Diagnosis:    ICD-10-CM   1. Tardive dyskinesia  G24.01 benztropine (COGENTIN) 1 MG tablet    Valbenazine Tosylate (INGREZZA) 80 MG CAPS    Valbenazine Tosylate (INGREZZA) 40 MG CAPS  2. DISORDER, SCHIZO, RESIDUAL TYPE, CHRONIC  F20.5 divalproex (DEPAKOTE ER) 500 MG 24 hr tablet    OLANZapine (ZYPREXA) 15 MG tablet    Past Psychiatric History: Schizophrenia  Past Medical History:  Past Medical History:  Diagnosis Date  . Anemia   . Cancer ([place])   . Depression    chronic   . Diabetes mellitus without complication ([place])    type II  . Gallstones   . GERD (gastroesophageal reflux disease)   . History of radiation therapy 07/25/16- 09/05/16   Right Breast  . Hyperlipidemia   . Hypertension   . Personal history of chemotherapy   . Personal history of radiation therapy   . Schizophrenia [facility])     Past Surgical History:  Procedure Laterality Date  . ABDOMINAL HYSTERECTOMY    .  BREAST LUMPECTOMY Right 2017  . BREAST LUMPECTOMY WITH RADIOACTIVE SEED AND SENTINEL LYMPH NODE BIOPSY Right 01/20/2016   Procedure: RIGHT BREAST LUMPECTOMY WITH RADIOACTIVE SEED AND SENTINEL LYMPH NODE BIOPSY;  Surgeon: Coralie Keens, MD;  Location: [place];  Service: General;  Laterality: Right;  . CHOLECYSTECTOMY N/A 04/16/2014   Procedure: LAPAROSCOPIC CHOLECYSTECTOMY ;  Surgeon: [name]. Georgette Dover, MD;  [facility];  Service: General;  Laterality: N/A;  . DEBULKING N/A  06/01/2017   Procedure: POSSIBLE DEBULKING;  Surgeon: Janie Morning, MD;  [facility];  Service: Gynecology;  Laterality: N/A;  . IR RADIOLOGIST EVAL & MGMT  06/27/2017  . PORT-A-CATH REMOVAL Left 10/13/2016   Procedure: REMOVAL PORT-A-CATH;  Surgeon: Coralie Keens, MD;  [facility];  Service: General;  Laterality: Left;  . PORTACATH PLACEMENT Left 01/20/2016   Procedure: INSERTION PORT-A-CATH;  Surgeon: Coralie Keens, MD;  Location: [place];  Service: General;  Laterality: Left;  . ROBOTIC ASSISTED BILATERAL SALPINGO OOPHERECTOMY N/A 06/01/2017   Procedure: XI ROBOTIC ASSISTED BILATERAL SALPINGO OOPHORECTOMY AND PELVIC WASHINGS;  Surgeon: Janie Morning, MD;  [facility];  Service: Gynecology;  Laterality: N/A;    Family Psychiatric History: Brother autism  Family History:  Family History  Problem Relation Age of Onset  . Breast cancer Mother 42       s/p partial mastectomy  . Skin cancer Mother        on right knee; unspecified type  . Colon polyps Mother        three total  . Other Mother 42       history of a TAH-BSO for bleeding  . Prostate cancer Maternal Uncle        (x2 maternal uncles)  . Cirrhosis Maternal Grandfather        +EtOH  . Cancer Brother 55       vocal cord ca; not a smoker  . Autism Brother   . Cancer Maternal Uncle        vocal cord ca; +smoker  . Ovarian cancer Maternal Aunt        dx. >50  . Cancer Maternal Aunt        unspecified type  . Diabetes Maternal Aunt   . Colon cancer Cousin        (x2 maternal first cousins--sisters)  . Breast cancer Cousin        dx. 37 or younger  . Cancer Other        NOS cancer; lim info; all three of his daughters had cancer as well  . Breast cancer Other     Social History:  Social History   Socioeconomic History  . Marital status: Single    Spouse name: Not on file  . Number of children: Not on file  . Years of education: Not on file  . Highest education level: Not on  file  Occupational History  . Not on file  Tobacco Use  . Smoking status: Former Smoker    Packs/day: 0.25    Types: Cigarettes    Quit date: 12/2015    Years since quitting: 4.9  . Smokeless tobacco: Never Used  Vaping Use  . Vaping Use: Never used  Substance and Sexual Activity  . Alcohol use: Yes    Comment: "only on special occasions"   . Drug use: No  . Sexual activity: Yes    Birth control/protection: Surgical  Other Topics Concern  . Not on file  Social History  Narrative  . Not on file   Social Determinants of Health   Financial Resource Strain:   . Difficulty of Paying Living Expenses: Not on file  Food Insecurity:   . Worried About Charity fundraiser in the Last Year: Not on file  . Ran Out of Food in the Last Year: Not on file  Transportation Needs:   . Lack of Transportation (Medical): Not on file  . Lack of Transportation (Non-Medical): Not on file  Physical Activity:   . Days of Exercise per Week: Not on file  . Minutes of Exercise per Session: Not on file  Stress:   . Feeling of Stress : Not on file  Social Connections:   . Frequency of Communication with Friends and Family: Not on file  . Frequency of Social Gatherings with Friends and Family: Not on file  . Attends Religious Services: Not on file  . Active Member of Clubs or Organizations: Not on file  . Attends Archivist Meetings: Not on file  . Marital Status: Not on file    Allergies: No Known Allergies  Metabolic Disorder Labs: Lab Results  Component Value Date   HGBA1C 6.4 (H) 05/25/2017   MPG 137 05/25/2017   No results found for: PROLACTIN No results found for: CHOL, TRIG, HDL, CHOLHDL, VLDL, LDLCALC No results found for: TSH  Therapeutic Level Labs: No results found for: LITHIUM No results found for: VALPROATE No components found for:  CBMZ  Current Medications: Current Outpatient Medications  Medication Sig Dispense Refill  . benztropine (COGENTIN) 1 MG tablet  Take 1 tablet (1 mg total) by mouth daily. 30 tablet 2  . carvedilol (COREG) 3.125 MG tablet TAKE 1 TABLET BY MOUTH TWICE DAILY. 60 tablet 3  . cromolyn (OPTICROM) 4 % ophthalmic solution Place 1 drop into both eyes 4 (four) times daily.    . cycloSPORINE (RESTASIS) 0.05 % ophthalmic emulsion Place 1 drop into both eyes 2 (two) times daily.    . divalproex (DEPAKOTE ER) 500 MG 24 hr tablet TAKE 2 TABLETS (-=1000MG ) BY MOUTH EVERY NIGHT AT BEDTIME FOR MOOD 60 tablet 2  . furosemide (LASIX) 40 MG tablet Take 40 mg by mouth every morning.    Marland Kitchen lisinopril (PRINIVIL,ZESTRIL) 10 MG tablet Take 10 mg by mouth every morning.     . metFORMIN (GLUCOPHAGE) 500 MG tablet Take 500 mg by mouth 2 (two) times daily with a meal.     . OLANZapine (ZYPREXA) 15 MG tablet TAKE 1 TABLET BY MOUTH AT BEDTIME FOR MOOD 30 tablet 2  . omeprazole (PRILOSEC) 20 MG capsule Take 40 mg by mouth every morning.    . simvastatin (ZOCOR) 10 MG tablet Take 10 mg by mouth at bedtime.    [place] Tosylate (INGREZZA) 40 MG CAPS Take 1 capsule (40 mg total) by mouth daily. 7 capsule 0  . Valbenazine Tosylate (INGREZZA) 80 MG CAPS Take 80 mg by mouth daily. 30 capsule 2   No current facility-administered medications for this visit.     Musculoskeletal: Strength & Muscle Tone: within normal limits Gait & Station: broad based Patient leans: N/A  Psychiatric Specialty Exam: Review of Systems  Blood pressure 115/76, pulse 97, height 5\' 4"  (1.626 m), weight 159 lb (72.1 kg), SpO2 97 %.Body mass index is 27.29 kg/m.  General Appearance: Well Groomed  Eye Contact:  Good  Speech:  Clear and Coherent and Normal Rate  Volume:  Normal  Mood:  Euthymic  Affect:  Appropriate  and Congruent  Thought Process:  Coherent, Goal Directed and Linear  Orientation:  Full (Time, Place, and Person)  Thought Content: WDL and Logical   Suicidal Thoughts:  No  Homicidal Thoughts:  No  Memory:  Immediate;   Good Recent;   Good Remote;   Good   Judgement:  Good  Insight:  Good  Psychomotor Activity:  EPS  Concentration:  Concentration: Good and Attention Span: Good  Recall:  Good  Fund of Knowledge: Good  Language: Good  Akathisia:  No  Handed:  Right  AIMS (if indicated): Done- Score 8  Assets:  Communication Skills Desire for Improvement Financial Resources/Insurance Housing Leisure Time Social Support  ADL's:  Intact  Cognition: WNL  Sleep:  Good   Screenings: AIMS     Clinical Support from 11/18/2020 in [place] Total Score 8    GAD-7     Clinical Support from 11/18/2020 in [facility]  Total GAD-7 Score 2    PHQ2-9     Clinical Support from 11/18/2020 in [name] from 01/06/2016 in [facility]  PHQ-2 Total Score 0 0  PHQ-9 Total Score 1 --       Assessment and Plan: Patient endorses symptoms of tardive dyskinesia denied anxiety and depression. Sheis agreeable to reduce Cogentin 1 mg twice daily to 1 mg daily. She is also agreeable to start Ingrezza 40 mg for a week and then increase to 80 mg. Patient given a 40 mg week supply of Ingrezza. Provider placed an order for 80 mg to be filled by her pharmacy. Per facility staff patient's pharmacy (express care requires a written order for Ingrezza 40 in order for the [facility] to be correct). Provider sent electronic order however informed pharmacy not to fill 40 mg as patient was given a week sample  Follow-up in 2 months  Salley Slaughter, NP 11/18/2020, 9:22 AM

## 2020-11-19 ENCOUNTER — Telehealth (HOSPITAL_COMMUNITY): Payer: Self-pay | Admitting: *Deleted

## 2020-11-19 NOTE — Telephone Encounter (Signed)
Facility she resides in called to ask how she should proceed since her Ingrezza request was denied. Request had not been done until just now. Submitted form to [facility], waiting on determination.

## 2020-11-23 ENCOUNTER — Telehealth (HOSPITAL_COMMUNITY): Payer: Self-pay | Admitting: *Deleted

## 2020-11-23 NOTE — Telephone Encounter (Signed)
Prior authorization done and it was approved for her Ingrezza. Auth number is [id number] and its approved from 11/20/20-05/19/21. Pharmacy notified.

## 2020-12-23 ENCOUNTER — Telehealth (HOSPITAL_COMMUNITY): Payer: Self-pay | Admitting: *Deleted

## 2020-12-23 NOTE — Telephone Encounter (Signed)
Call from administrator of patients new facility to find out what specialty pharmacy they could use for patients Rx for Ingrezza. Shared with her the pharmacy we use which is [place] and gave her the name and number of Sharyn Lull to speak with there and that they do ship meds if it would make it easier for them. I got from her the new address and phone number where patient is now living.

## 2021-01-19 ENCOUNTER — Encounter (HOSPITAL_COMMUNITY): Payer: Self-pay | Admitting: Psychiatry

## 2021-01-19 ENCOUNTER — Other Ambulatory Visit: Payer: Self-pay

## 2021-01-19 ENCOUNTER — Telehealth (INDEPENDENT_AMBULATORY_CARE_PROVIDER_SITE_OTHER): Payer: Medicaid Other | Admitting: Psychiatry

## 2021-01-19 DIAGNOSIS — F205 Residual schizophrenia: Secondary | ICD-10-CM

## 2021-01-19 DIAGNOSIS — G2401 Drug induced subacute dyskinesia: Secondary | ICD-10-CM | POA: Diagnosis not present

## 2021-01-19 MED ORDER — DIVALPROEX SODIUM ER 500 MG PO TB24: 500.0000 mg | ORAL_TABLET | Freq: Every day | ORAL | 2 refills | Status: DC

## 2021-01-19 MED ORDER — BENZTROPINE MESYLATE 1 MG PO TABS: 1.0000 mg | ORAL_TABLET | Freq: Every day | ORAL | 2 refills | Status: DC

## 2021-01-19 MED ORDER — INGREZZA 80 MG PO CAPS: 80.0000 mg | ORAL_CAPSULE | Freq: Every day | ORAL | 2 refills | Status: DC

## 2021-01-19 MED ORDER — OLANZAPINE 15 MG PO TABS: ORAL_TABLET | ORAL | 2 refills | Status: DC

## 2021-01-19 NOTE — Progress Notes (Signed)
BH MD/PA/NP OP Progress Note Virtual Visit via Video Note  I connected with Alison [name] on 01/19/21 at 11:30 AM EST by a video enabled telemedicine application and verified that I am speaking with the correct person using two identifiers.  Location: Patient: Home Provider: Clinic   I discussed the limitations of evaluation and management by telemedicine and the availability of in person appointments. The patient expressed understanding and agreed to proceed.  I provided 30 minutes of non-face-to-face time during this encounter.    01/19/2021 3:53 PM Alison [name]  MRN:  [id number]  Chief Complaint:"I am tired of shaking and drooling"   HPI: 60 year old female seen today for follow up psychiatric evaluation. She has a psychiatric history of schizophrenia and is currently being managed on Depakote 1000 mg daily, Zyprexa 15 mg daily, and Ingrezza 80 mg, and cogentin 1 mg daily. Patient notes that her medications are somewhat effective in managing her psychiatric conditions  Today patient is well-groomed, pleasant, cooperative, engaged in conversation, and maintained eye contact. She informed provider that Alison [name] is ineffective in controlling her drooling and TD.  Patient has trialed Austedo, Cogentin, and Ingrezza.  Provider informed patient that Depakote could be the cause of her increased tremors.  Provider unable to conduct a full aims assessment because of telehealth visit.  Provider noticed facial twitching and abnormal movements of patient's face, arms, and hands.  Patient also notes that she has more abnormal movements in her legs.    Today patient denies symptoms of anxiety and depression.  She notes that she sleeps well and denies SI/HI/VAH or paranoia.    Today patient notes that she wants to discontinue Ingrezza.  She is agreeable to reducing Depakote 1000 mg to 500 mg to see if symptoms abnormal bodily movements improves.  She will continue all other medications as  prescribed.  Cogentin will be continued at this time as it is an anticholinergic and may reduce her drooling.  Patient will show up in the clinic in a month for further evaluation.  No other concerns noted at this time.    Visit Diagnosis:    ICD-10-CM   1. Tardive dyskinesia  G24.01 benztropine (COGENTIN) 1 MG tablet    DISCONTINUED: Valbenazine Tosylate (INGREZZA) 80 MG CAPS  2. DISORDER, SCHIZO, RESIDUAL TYPE, CHRONIC  F20.5 OLANZapine (ZYPREXA) 15 MG tablet    divalproex (DEPAKOTE ER) 500 MG 24 hr tablet    Past Psychiatric History: Schizophrenia  Past Medical History:  Past Medical History:  Diagnosis Date  . Anemia   . Cancer ([place])   . Depression    chronic   . Diabetes mellitus without complication ([place])    type II  . Gallstones   . GERD (gastroesophageal reflux disease)   . History of radiation therapy 07/25/16- 09/05/16   Right Breast  . Hyperlipidemia   . Hypertension   . Personal history of chemotherapy   . Personal history of radiation therapy   . Schizophrenia [facility])     Past Surgical History:  Procedure Laterality Date  . ABDOMINAL HYSTERECTOMY    . BREAST LUMPECTOMY Right 2017  . BREAST LUMPECTOMY WITH RADIOACTIVE SEED AND SENTINEL LYMPH NODE BIOPSY Right 01/20/2016   Procedure: RIGHT BREAST LUMPECTOMY WITH RADIOACTIVE SEED AND SENTINEL LYMPH NODE BIOPSY;  Surgeon: Coralie Keens, MD;  Location: [place];  Service: General;  Laterality: Right;  . CHOLECYSTECTOMY N/A 04/16/2014   Procedure: LAPAROSCOPIC CHOLECYSTECTOMY ;  Surgeon: [name]. Tsuei, MD;  [facility];  Service: General;  Laterality: N/A;  . DEBULKING N/A 06/01/2017   Procedure: POSSIBLE DEBULKING;  Surgeon: Janie Morning, MD;  [facility];  Service: Gynecology;  Laterality: N/A;  . IR RADIOLOGIST EVAL & MGMT  06/27/2017  . PORT-A-CATH REMOVAL Left 10/13/2016   Procedure: REMOVAL PORT-A-CATH;  Surgeon: Coralie Keens, MD;  [facility];  Service: General;  Laterality:  Left;  . PORTACATH PLACEMENT Left 01/20/2016   Procedure: INSERTION PORT-A-CATH;  Surgeon: Coralie Keens, MD;  Location: [place];  Service: General;  Laterality: Left;  . ROBOTIC ASSISTED BILATERAL SALPINGO OOPHERECTOMY N/A 06/01/2017   Procedure: XI ROBOTIC ASSISTED BILATERAL SALPINGO OOPHORECTOMY AND PELVIC WASHINGS;  Surgeon: Janie Morning, MD;  [facility];  Service: Gynecology;  Laterality: N/A;    Family Psychiatric History: Brother autism  Family History:  Family History  Problem Relation Age of Onset  . Breast cancer Mother 34       s/p partial mastectomy  . Skin cancer Mother        on right knee; unspecified type  . Colon polyps Mother        three total  . Other Mother 37       history of a TAH-BSO for bleeding  . Prostate cancer Maternal Uncle        (x2 maternal uncles)  . Cirrhosis Maternal Grandfather        +EtOH  . Cancer Brother 55       vocal cord ca; not a smoker  . Autism Brother   . Cancer Maternal Uncle        vocal cord ca; +smoker  . Ovarian cancer Maternal Aunt        dx. >50  . Cancer Maternal Aunt        unspecified type  . Diabetes Maternal Aunt   . Colon cancer Cousin        (x2 maternal first cousins--sisters)  . Breast cancer Cousin        dx. 93 or younger  . Cancer Other        NOS cancer; lim info; all three of his daughters had cancer as well  . Breast cancer Other     Social History:  Social History   Socioeconomic History  . Marital status: Single    Spouse name: Not on file  . Number of children: Not on file  . Years of education: Not on file  . Highest education level: Not on file  Occupational History  . Not on file  Tobacco Use  . Smoking status: Former Smoker    Packs/day: 0.25    Types: Cigarettes    Quit date: 12/2015    Years since quitting: 5.1  . Smokeless tobacco: Never Used  Vaping Use  . Vaping Use: Never used  Substance and Sexual Activity  . Alcohol use: Yes    Comment: "only  on special occasions"   . Drug use: No  . Sexual activity: Yes    Birth control/protection: Surgical  Other Topics Concern  . Not on file  Social History Narrative  . Not on file   Social Determinants of Health   Financial Resource Strain: Not on file  Food Insecurity: Not on file  Transportation Needs: Not on file  Physical Activity: Not on file  Stress: Not on file  Social Connections: Not on file    Allergies: No Known Allergies  Metabolic Disorder Labs: Lab Results  Component Value Date   HGBA1C 6.4 (H)  05/25/2017   MPG 137 05/25/2017   No results found for: PROLACTIN No results found for: CHOL, TRIG, HDL, CHOLHDL, VLDL, LDLCALC No results found for: TSH  Therapeutic Level Labs: No results found for: LITHIUM No results found for: VALPROATE No components found for:  CBMZ  Current Medications: Current Outpatient Medications  Medication Sig Dispense Refill  . benztropine (COGENTIN) 1 MG tablet Take 1 tablet (1 mg total) by mouth daily. 30 tablet 2  . carvedilol (COREG) 3.125 MG tablet TAKE 1 TABLET BY MOUTH TWICE DAILY. 60 tablet 3  . cromolyn (OPTICROM) 4 % ophthalmic solution Place 1 drop into both eyes 4 (four) times daily.    . cycloSPORINE (RESTASIS) 0.05 % ophthalmic emulsion Place 1 drop into both eyes 2 (two) times daily.    . divalproex (DEPAKOTE ER) 500 MG 24 hr tablet Take 1 tablet (500 mg total) by mouth daily. TAKE 1 TABLETS  BY MOUTH EVERY NIGHT AT BEDTIME FOR MOOD 30 tablet 2  . furosemide (LASIX) 40 MG tablet Take 40 mg by mouth every morning.    Marland Kitchen lisinopril (PRINIVIL,ZESTRIL) 10 MG tablet Take 10 mg by mouth every morning.     . metFORMIN (GLUCOPHAGE) 500 MG tablet Take 500 mg by mouth 2 (two) times daily with a meal.     . OLANZapine (ZYPREXA) 15 MG tablet TAKE 1 TABLET BY MOUTH AT BEDTIME FOR MOOD 30 tablet 2  . omeprazole (PRILOSEC) 20 MG capsule Take 40 mg by mouth every morning.    . simvastatin (ZOCOR) 10 MG tablet Take 10 mg by mouth at  bedtime.     No current facility-administered medications for this visit.     Musculoskeletal: Strength & Muscle Tone: Unable to assess due to telehealth visit [place]: Unable to assess due to telehealth visit Patient leans: N/A  Psychiatric Specialty Exam: Review of Systems  There were no vitals taken for this visit.There is no height or weight on file to calculate BMI.  General Appearance: Well Groomed  Eye Contact:  Good  Speech:  Clear and Coherent and Normal Rate  Volume:  Normal  Mood:  Euthymic  Affect:  Appropriate and Congruent  Thought Process:  Coherent, Goal Directed and Linear  Orientation:  Full (Time, Place, and Person)  Thought Content: WDL and Logical   Suicidal Thoughts:  No  Homicidal Thoughts:  No  Memory:  Immediate;   Good Recent;   Good Remote;   Good  Judgement:  Good  Insight:  Good  Psychomotor Activity:  EPS  Concentration:  Concentration: Good and Attention Span: Good  Recall:  Good  Fund of Knowledge: Good  Language: Good  Akathisia:  No  Handed:  Right  AIMS (if indicated): Done- Score 8  Assets:  Communication Skills Desire for Improvement Financial Resources/Insurance Housing Leisure Time Social Support  ADL's:  Intact  Cognition: WNL  Sleep:  Good   Screenings: AIMS   Flowsheet Row Clinical Support from 11/18/2020 in [place] Total Score 8    GAD-7   Flowsheet Row Clinical Support from 11/18/2020 in [facility]  Total GAD-7 Score 2    PHQ2-9   [place] from 11/18/2020 in [place] from 01/06/2016 in [place]  PHQ-2 Total Score 0 0  PHQ-9 Total Score 1 -       Assessment and Plan: Patient endorses symptoms of tardive dyskinesia and denied  anxiety and depression. Today patient notes that she wants to discontinue Ingrezza.  She is agreeable to reducing  Depakote 1000 mg to 500 mg to see if symptoms abnormal bodily movements improves.  She will continue all other medications as prescribed.  Cogentin will be continued at this time as it is an anticholinergic and may reduce her drooling.   1. Tardive dyskinesia  Continue- benztropine (COGENTIN) 1 MG tablet; Take 1 tablet (1 mg total) by mouth daily.  Dispense: 30 tablet; Refill: 2  2. DISORDER, SCHIZO, RESIDUAL TYPE, CHRONIC  Continue- OLANZapine (ZYPREXA) 15 MG tablet; TAKE 1 TABLET BY MOUTH AT BEDTIME FOR MOOD  Dispense: 30 tablet; Refill: 2 Reduced- divalproex (DEPAKOTE ER) 500 MG 24 hr tablet; Take 1 tablet (500 mg total) by mouth daily. TAKE 1 TABLETS  BY MOUTH EVERY NIGHT AT BEDTIME FOR MOOD  Dispense: 30 tablet; Refill: 2  Follow-up in 1 months  Salley Slaughter, NP 01/19/2021, 3:53 PM

## 2021-02-10 ENCOUNTER — Other Ambulatory Visit: Payer: Self-pay | Admitting: Hematology and Oncology

## 2021-02-10 DIAGNOSIS — Z9889 Other specified postprocedural states: Secondary | ICD-10-CM

## 2021-02-17 ENCOUNTER — Telehealth: Payer: Self-pay | Admitting: Hematology and Oncology

## 2021-02-17 NOTE — Telephone Encounter (Signed)
Nurse from facility that patient is currently at called to reschedule 3/10 appt. Confirmed new date and time

## 2021-02-18 ENCOUNTER — Encounter (HOSPITAL_COMMUNITY): Payer: Self-pay | Admitting: Psychiatry

## 2021-02-18 ENCOUNTER — Other Ambulatory Visit: Payer: Self-pay

## 2021-02-18 ENCOUNTER — Telehealth (INDEPENDENT_AMBULATORY_CARE_PROVIDER_SITE_OTHER): Payer: Medicaid Other | Admitting: Psychiatry

## 2021-02-18 ENCOUNTER — Ambulatory Visit: Payer: Medicaid Other | Admitting: Hematology and Oncology

## 2021-02-18 DIAGNOSIS — F205 Residual schizophrenia: Secondary | ICD-10-CM

## 2021-02-18 DIAGNOSIS — G2401 Drug induced subacute dyskinesia: Secondary | ICD-10-CM

## 2021-02-18 MED ORDER — BENZTROPINE MESYLATE 1 MG PO TABS: 1.0000 mg | ORAL_TABLET | Freq: Every day | ORAL | 2 refills | Status: DC

## 2021-02-18 MED ORDER — OLANZAPINE 15 MG PO TABS: ORAL_TABLET | ORAL | 2 refills | Status: DC

## 2021-02-18 MED ORDER — DIVALPROEX SODIUM ER 500 MG PO TB24: 500.0000 mg | ORAL_TABLET | Freq: Every day | ORAL | 2 refills | Status: DC

## 2021-02-18 NOTE — Progress Notes (Signed)
BH MD/PA/NP OP Progress Note Virtual Visit video Note  I connected with Alison [name] on 02/18/21 at  8:00 AM EST by a video enabled telemedicine application and verified that I am speaking with the correct person using two identifiers.  Location: Patient: Home Provider: Clinic   I discussed the limitations of evaluation and management by telemedicine and the availability of in person appointments. The patient expressed understanding and agreed to proceed.  I provided 30 minutes of non-face-to-face time during this encounter.    02/18/2021 9:42 AM Alison [name]  MRN:  [id number]  Chief Complaint:"I am not shaking or drooling"   HPI: 60 year old female seen today for follow up psychiatric evaluation. She has a psychiatric history of schizophrenia and is currently being managed on Depakote 500 mg daily, Zyprexa 15 mg daily, and cogentin 1 mg daily. Patient notes that her medications are  effective in managing her psychiatric conditions  Today patient is well-groomed, pleasant, cooperative, engaged in conversation, and maintained eye contact. She informed provider that since she discontinued Ingrezza and reduced Depakote her drooling and tremors have been under control.  Provider conducted an aims assessment and patient scored a 1.  She has slight tremors around her perioral area.  No other abnormal findings noted.  She notes that her mood has been stable on the reduction of Depakote.  She also informed provider that she has very minimal anxiety and depression.  Today provider conducted a GAD-7 and patient scored a 6.  Provider also conducted a PHQ-9 and patient scored a 5.  She endorses adequate sleep noting that she sleeps like a log.  She also endorses adequate appetite.  Today she denies SI/HI/VAH or paranoia.  Patient was joyful during exam and joked about being paranoid that men were coming to get her.  No medication changes made today.  Patient agreeable to continue medication as  prescribed.  No other concerns noted at this time.    Visit Diagnosis:    ICD-10-CM   1. Tardive dyskinesia  G24.01 benztropine (COGENTIN) 1 MG tablet  2. DISORDER, SCHIZO, RESIDUAL TYPE, CHRONIC  F20.5 divalproex (DEPAKOTE ER) 500 MG 24 hr tablet    OLANZapine (ZYPREXA) 15 MG tablet    Past Psychiatric History: Schizophrenia  Past Medical History:  Past Medical History:  Diagnosis Date  . Anemia   . Cancer ([place])   . Depression    chronic   . Diabetes mellitus without complication ([place])    type II  . Gallstones   . GERD (gastroesophageal reflux disease)   . History of radiation therapy 07/25/16- 09/05/16   Right Breast  . Hyperlipidemia   . Hypertension   . Personal history of chemotherapy   . Personal history of radiation therapy   . Schizophrenia [facility]     Past Surgical History:  Procedure Laterality Date  . ABDOMINAL HYSTERECTOMY    . BREAST LUMPECTOMY Right 2017  . BREAST LUMPECTOMY WITH RADIOACTIVE SEED AND SENTINEL LYMPH NODE BIOPSY Right 01/20/2016   Procedure: RIGHT BREAST LUMPECTOMY WITH RADIOACTIVE SEED AND SENTINEL LYMPH NODE BIOPSY;  Surgeon: Coralie Keens, MD;  Location: [place];  Service: General;  Laterality: Right;  . CHOLECYSTECTOMY N/A 04/16/2014   Procedure: LAPAROSCOPIC CHOLECYSTECTOMY ;  Surgeon: [name]. Georgette Dover, MD;  Location: [facility];  Service: General;  Laterality: N/A;  . DEBULKING N/A 06/01/2017   Procedure: POSSIBLE DEBULKING;  Surgeon: Janie Morning, MD;  Location: [facility];  Service: Gynecology;  Laterality: N/A;  . IR RADIOLOGIST EVAL &  MGMT  06/27/2017  . PORT-A-CATH REMOVAL Left 10/13/2016   Procedure: REMOVAL PORT-A-CATH;  Surgeon: Coralie Keens, MD;  Location: [facility];  Service: General;  Laterality: Left;  . PORTACATH PLACEMENT Left 01/20/2016   Procedure: INSERTION PORT-A-CATH;  Surgeon: Coralie Keens, MD;  Location: [place];  Service: General;  Laterality: Left;  . ROBOTIC ASSISTED BILATERAL SALPINGO  OOPHERECTOMY N/A 06/01/2017   Procedure: XI ROBOTIC ASSISTED BILATERAL SALPINGO OOPHORECTOMY AND PELVIC WASHINGS;  Surgeon: Janie Morning, MD;  Location[facility];  Service: Gynecology;  Laterality: N/A;    Family Psychiatric History: Brother autism  Family History:  Family History  Problem Relation Age of Onset  . Breast cancer Mother 49       s/p partial mastectomy  . Skin cancer Mother        on right knee; unspecified type  . Colon polyps Mother        three total  . Other Mother 86       history of a TAH-BSO for bleeding  . Prostate cancer Maternal Uncle        (x2 maternal uncles)  . Cirrhosis Maternal Grandfather        +EtOH  . Cancer Brother 55       vocal cord ca; not a smoker  . Autism Brother   . Cancer Maternal Uncle        vocal cord ca; +smoker  . Ovarian cancer Maternal Aunt        dx. >50  . Cancer Maternal Aunt        unspecified type  . Diabetes Maternal Aunt   . Colon cancer Cousin        (x2 maternal first cousins--sisters)  . Breast cancer Cousin        dx. 40 or younger  . Cancer Other        NOS cancer; lim info; all three of his daughters had cancer as well  . Breast cancer Other     Social History:  Social History   Socioeconomic History  . Marital status: Single    Spouse name: Not on file  . Number of children: Not on file  . Years of education: Not on file  . Highest education level: Not on file  Occupational History  . Not on file  Tobacco Use  . Smoking status: Former Smoker    Packs/day: 0.25    Types: Cigarettes    Quit date: 12/2015    Years since quitting: 5.1  . Smokeless tobacco: Never Used  Vaping Use  . Vaping Use: Never used  Substance and Sexual Activity  . Alcohol use: Yes    Comment: "only on special occasions"   . Drug use: No  . Sexual activity: Yes    Birth control/protection: Surgical  Other Topics Concern  . Not on file  Social History Narrative  . Not on file   Social Determinants of Health    Financial Resource Strain: Not on file  Food Insecurity: Not on file  Transportation Needs: Not on file  Physical Activity: Not on file  Stress: Not on file  Social Connections: Not on file    Allergies: No Known Allergies  Metabolic Disorder Labs: Lab Results  Component Value Date   HGBA1C 6.4 (H) 05/25/2017   MPG 137 05/25/2017   No results found for: PROLACTIN No results found for: CHOL, TRIG, HDL, CHOLHDL, VLDL, LDLCALC No results found for: TSH  Therapeutic Level Labs: No results found for:  LITHIUM No results found for: VALPROATE No components found for:  CBMZ  Current Medications: Current Outpatient Medications  Medication Sig Dispense Refill  . benztropine (COGENTIN) 1 MG tablet Take 1 tablet (1 mg total) by mouth daily. 30 tablet 2  . carvedilol (COREG) 3.125 MG tablet TAKE 1 TABLET BY MOUTH TWICE DAILY. 60 tablet 3  . cromolyn (OPTICROM) 4 % ophthalmic solution Place 1 drop into both eyes 4 (four) times daily.    . cycloSPORINE (RESTASIS) 0.05 % ophthalmic emulsion Place 1 drop into both eyes 2 (two) times daily.    . divalproex (DEPAKOTE ER) 500 MG 24 hr tablet Take 1 tablet (500 mg total) by mouth daily. TAKE 1 TABLETS  BY MOUTH EVERY NIGHT AT BEDTIME FOR MOOD 30 tablet 2  . furosemide (LASIX) 40 MG tablet Take 40 mg by mouth every morning.    Marland Kitchen lisinopril (PRINIVIL,ZESTRIL) 10 MG tablet Take 10 mg by mouth every morning.     . metFORMIN (GLUCOPHAGE) 500 MG tablet Take 500 mg by mouth 2 (two) times daily with a meal.     . OLANZapine (ZYPREXA) 15 MG tablet TAKE 1 TABLET BY MOUTH AT BEDTIME FOR MOOD 30 tablet 2  . omeprazole (PRILOSEC) 20 MG capsule Take 40 mg by mouth every morning.    . simvastatin (ZOCOR) 10 MG tablet Take 10 mg by mouth at bedtime.     No current facility-administered medications for this visit.     Musculoskeletal: Strength & Muscle Tone: Unable to assess due to telehealth visit [place]: Unable to assess due to telehealth  visit Patient leans: N/A  Psychiatric Specialty Exam: Review of Systems  There were no vitals taken for this visit.There is no height or weight on file to calculate BMI.  General Appearance: Well Groomed  Eye Contact:  Good  Speech:  Clear and Coherent and Normal Rate  Volume:  Normal  Mood:  Euthymic  Affect:  Appropriate and Congruent  Thought Process:  Coherent, Goal Directed and Linear  Orientation:  Full (Time, Place, and Person)  Thought Content: WDL and Logical   Suicidal Thoughts:  No  Homicidal Thoughts:  No  Memory:  Immediate;   Good Recent;   Good Remote;   Good  Judgement:  Good  Insight:  Good  Psychomotor Activity:  Normal  Concentration:  Concentration: Good and Attention Span: Good  Recall:  Good  Fund of Knowledge: Good  Language: Good  Akathisia:  No  Handed:  Right  AIMS (if indicated): Done- Score 1  Assets:  Communication Skills Desire for Improvement Financial Resources/Insurance Housing Leisure Time Social Support  ADL's:  Intact  Cognition: WNL  Sleep:  Good   Screenings: AIMS   Flowsheet Row Clinical Support from 11/18/2020 in [place] Total Score 8    GAD-7   Flowsheet Row Video Visit from 02/18/2021 in [place] from 11/18/2020 in [facility]  Total GAD-7 Score 6 2    PHQ2-9   Flowsheet Row Video Visit from 02/18/2021 in [place] from 11/18/2020 in [place] from 01/06/2016 in [place]  PHQ-2 Total Score 2 0 0  PHQ-9 Total Score 5 1 --    Flowsheet Row Video Visit from 02/18/2021 in [place] Error: Q3, 4, or 5 should not be populated when Q2  is No       Assessment and Plan: Patient notes that she is doing well on her current medication regimen.  No  medication changes made at this time.  Patient agreeable to continue medications prescribed.  1. Tardive dyskinesia  Continue- benztropine (COGENTIN) 1 MG tablet; Take 1 tablet (1 mg total) by mouth daily.  Dispense: 30 tablet; Refill: 2  2. DISORDER, SCHIZO, RESIDUAL TYPE, CHRONIC  Continue- OLANZapine (ZYPREXA) 15 MG tablet; TAKE 1 TABLET BY MOUTH AT BEDTIME FOR MOOD  Dispense: 30 tablet; Refill: 2 Reduced- divalproex (DEPAKOTE ER) 500 MG 24 hr tablet; Take 1 tablet (500 mg total) by mouth daily. TAKE 1 TABLETS  BY MOUTH EVERY NIGHT AT BEDTIME FOR MOOD  Dispense: 30 tablet; Refill: 2  Follow-up in 1 months  Salley Slaughter, NP 02/18/2021, 9:42 AM

## 2021-02-28 NOTE — Progress Notes (Signed)
Patient Care Team: Benito Mccreedy, MD as PCP - General (Internal Medicine)  DIAGNOSIS:    ICD-10-CM   1. Malignant neoplasm of upper-outer quadrant of right breast in female, estrogen receptor negative ([place])  C50.411    Z17.1     SUMMARY OF ONCOLOGIC HISTORY: Oncology History  Breast cancer of upper-outer quadrant of right female breast ([place])  11/25/2015 Mammogram   Right breast mass 12 cm from the nipple: 1.4 x 1.3 x 0.9 cm, two right axillary lymph nodes with borderline cortical thickening measuring up to 3 mm   12/04/2015 Initial Diagnosis   Right breast biopsy 10:00: Invasive ductal carcinoma with DCIS, grade 2, ER 0%, PR 0%, HER-2 negative, ratio1.31, Ki-67 40%   01/13/2016 Procedure   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 Additionally, 1 VUS, called "c.16A>C (p.Thr6Pro)" was found in one copy of the MSH6 gene. Genes analyzed: ATM, BARD1, BRCA1, BRCA2, BRIP1, CDH1, CHEK2, FANCC, MLH1, MSH2, MSH6, NBN, PALB2, PMS2, PTEN, RAD51C, RAD51D, TP53, and XRCC2.  This panel also includes deletion/duplication analysis (without sequencing) for one gene, EPCAM   01/20/2016 Surgery   Rt Lumpectomy Ninfa Linden): IDC 1.7 cm, grade 3, with DCIS, 0/2 LN, ER 0%, PR 0%, Her 2 Neg Ratio 1.35, Ki 67: 40%, T1CN0 (stage 1A)   02/11/2016 - 06/30/2016 Chemotherapy   Adjuvant chemotherapy with dose dense Adriamycin and Cytoxan 4 followed by Abraxane weekly 12   07/25/2016 - 09/05/2016 Radiation Therapy   Adjuvant radiation therapy Isidore Moos). Right breast / 50 Gy in 25 fractions.  Right breast boost / 10 Gy in 5 fractions   06/01/2017 Surgery   Pelvic abscess: S/P Bil Salpingo Oophorectomy; No cancer     CHIEF COMPLIANT: Surveillance of breast cancer  INTERVAL HISTORY: Alison [name] is a 60 y.o. with above-mentioned history of BRCA2 mutation positivetriple negativeright breast cancer treated with lumpectomy, adjuvant chemotherapy, and radiation who is currently on  surveillance. Mammogram on 03/18/20 showed no evidence of malignancy bilaterally.Breast MRI on 09/24/20 showed no evidence of malignancy. She presents to the clinictodayfor annual follow-up.     ALLERGIES:  has No Known Allergies.  MEDICATIONS:  Current Outpatient Medications  Medication Sig Dispense Refill  . benztropine (COGENTIN) 1 MG tablet Take 1 tablet (1 mg total) by mouth daily. 30 tablet 2  . carvedilol (COREG) 3.125 MG tablet TAKE 1 TABLET BY MOUTH TWICE DAILY. 60 tablet 3  . cromolyn (OPTICROM) 4 % ophthalmic solution Place 1 drop into both eyes 4 (four) times daily.    . cycloSPORINE (RESTASIS) 0.05 % ophthalmic emulsion Place 1 drop into both eyes 2 (two) times daily.    . divalproex (DEPAKOTE ER) 500 MG 24 hr tablet Take 1 tablet (500 mg total) by mouth daily. TAKE 1 TABLETS  BY MOUTH EVERY NIGHT AT BEDTIME FOR MOOD 30 tablet 2  . furosemide (LASIX) 40 MG tablet Take 40 mg by mouth every morning.    Marland Kitchen lisinopril (PRINIVIL,ZESTRIL) 10 MG tablet Take 10 mg by mouth every morning.     . metFORMIN (GLUCOPHAGE) 500 MG tablet Take 500 mg by mouth 2 (two) times daily with a meal.     . OLANZapine (ZYPREXA) 15 MG tablet TAKE 1 TABLET BY MOUTH AT BEDTIME FOR MOOD 30 tablet 2  . omeprazole (PRILOSEC) 20 MG capsule Take 40 mg by mouth every morning.    . simvastatin (ZOCOR) 10 MG tablet Take 10 mg by mouth at bedtime.     No current facility-administered medications for this visit.    PHYSICAL  EXAMINATION: ECOG PERFORMANCE STATUS: 1 - Symptomatic but completely ambulatory  Vitals:   03/01/21 1112  BP: 123/74  Pulse: 77  Resp: 17  Temp: 97.7 F (36.5 C)  SpO2: 98%   Filed Weights   03/01/21 1112  Weight: 148 lb (67.1 kg)    BREAST: No palpable masses or nodules in either right or left breasts. No palpable axillary supraclavicular or infraclavicular adenopathy no breast tenderness or nipple discharge. (exam performed in the presence of a chaperone)  LABORATORY DATA:  I  have reviewed the data as listed CMP Latest Ref Rng & Units 04/19/2018 06/14/2017 05/25/2017  Glucose 65 - 99 mg/dL - 106(H) 148(H)  BUN 6 - 20 mg/dL - 10 12  Creatinine 0.44 - 1.00 mg/dL 0.80 0.68 0.80  Sodium 135 - 145 mmol/L - 140 143  Potassium 3.5 - 5.1 mmol/L - 3.9 3.8  Chloride 101 - 111 mmol/L - 103 104  CO2 22 - 32 mmol/L - 26 29  Alison 8.9 - 10.3 mg/dL - 8.9 9.0  Total Protein 6.5 - 8.1 g/dL - 7.5 7.0  Total Bilirubin 0.3 - 1.2 mg/dL - 0.5 0.3  Alkaline Phos 38 - 126 U/L - 87 70  AST 15 - 41 U/L - 23 28  ALT 14 - 54 U/L - 23 33    Lab Results  Component Value Date   WBC 5.6 06/14/2017   HGB 9.9 (L) 06/14/2017   HCT 29.7 (L) 06/14/2017   MCV 85.3 06/14/2017   PLT 327 06/14/2017   NEUTROABS 3.8 06/14/2017    ASSESSMENT & PLAN:  Breast cancer of upper-outer quadrant of right female breast ([place]) Rt Lumpectomy 01/20/16: IDC 1.7 cm, grade 3, with DCIS, 0/2 LN, ER 0%, PR 0%, Her 2 Neg Ratio 1.35, Ki 67: 40%, T1CN0 (stage 1A) (patient has mental handicap)  BRCA2 MutationPositive c.[id number] (p.[id number])".Additionally, one variant of uncertain significance, called "c.16A>C (p.Thr6Pro)" was found in one copy of the MSH6gene. 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 Adjuvant chemotherapy (Adriamycin and Cytoxan dose dense 4 followed by Abraxane weekly 12) started 02/11/2016 completed 06/30/2016 2. Followed by radiation started 07/25/2016 completed 09/05/2016 3. Bilateral salpingo-oophorectomy 06/01/2017 when she presented with suspected pelvic abscess: No cancer  Surveillance: 1. Mammogramsscheduled for 03/25/2021 2. Breast exam: 03/01/2021: Benign 3.Breast MRI 09/25/2020: Benign, patient will need breast MRI annually.  We will schedule another breast MRI in October 2022. 4.Breast ultrasound axilla: Benign fat necrosis 09/19/2017  Return to clinic in  1 year for follow-up    No orders of the defined types were placed in this encounter.  The patient has a good understanding of the overall plan. she agrees with it. she will call with any problems that may develop before the next visit here.  Total time spent: 20 mins including face to face time and time spent for planning, charting and coordination of care  Rulon Eisenmenger, MD, MPH 03/01/2021  I, Molly Dorshimer, am acting as scribe for Dr. Nicholas Lose.  I have reviewed the above documentation for accuracy and completeness, and I agree with the above.  Alison [name] send me a message by Janett Billow and her started lymphedema okay I like it okay

## 2021-03-01 ENCOUNTER — Inpatient Hospital Stay: Payer: Medicaid Other | Attending: Hematology and Oncology | Admitting: Hematology and Oncology

## 2021-03-01 ENCOUNTER — Telehealth: Payer: Self-pay | Admitting: Hematology and Oncology

## 2021-03-01 ENCOUNTER — Other Ambulatory Visit: Payer: Self-pay

## 2021-03-01 DIAGNOSIS — Z923 Personal history of irradiation: Secondary | ICD-10-CM | POA: Insufficient documentation

## 2021-03-01 DIAGNOSIS — Z171 Estrogen receptor negative status [ER-]: Secondary | ICD-10-CM

## 2021-03-01 DIAGNOSIS — N739 Female pelvic inflammatory disease, unspecified: Secondary | ICD-10-CM | POA: Diagnosis not present

## 2021-03-01 DIAGNOSIS — C50411 Malignant neoplasm of upper-outer quadrant of right female breast: Secondary | ICD-10-CM

## 2021-03-01 DIAGNOSIS — Z79899 Other long term (current) drug therapy: Secondary | ICD-10-CM | POA: Insufficient documentation

## 2021-03-01 DIAGNOSIS — Z9221 Personal history of antineoplastic chemotherapy: Secondary | ICD-10-CM | POA: Diagnosis not present

## 2021-03-01 DIAGNOSIS — I890 Lymphedema, not elsewhere classified: Secondary | ICD-10-CM | POA: Diagnosis not present

## 2021-03-01 NOTE — Assessment & Plan Note (Signed)
Rt Lumpectomy 01/20/16: IDC 1.7 cm, grade 3, with DCIS, 0/2 LN, ER 0%, PR 0%, Her 2 Neg Ratio 1.35, Ki 67: 40%, T1CN0 (stage 1A) (patient has mental handicap)  BRCA2 MutationPositive c.[id number] (p.[id number])".Additionally, one variant of uncertain significance, called "c.16A>C (p.Thr6Pro)" was found in one copy of the MSH6gene. 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 Adjuvant chemotherapy (Adriamycin and Cytoxan dose dense 4 followed by Abraxane weekly 12) started 02/11/2016 completed 06/30/2016 2. Followed by radiation started 07/25/2016 completed 09/05/2016 3. Bilateral salpingo-oophorectomy 06/01/2017 when she presented with suspected pelvic abscess: No cancer  Surveillance: 1. Mammogramsscheduled for 03/25/2021 2. Breast exam: 03/01/2021: Benign 3.Breast MRI 09/25/2020: Benign, patient will need breast MRI annually.  We will schedule another breast MRI in October 2022. 4.Breast ultrasound axilla: Benign fat necrosis 09/19/2017  Return to clinic in 1 year for follow-up

## 2021-03-01 NOTE — Telephone Encounter (Signed)
Scheduled appt per 3/21 los. Pt aware.

## 2021-03-23 ENCOUNTER — Other Ambulatory Visit: Payer: Self-pay

## 2021-03-23 ENCOUNTER — Encounter (HOSPITAL_COMMUNITY): Payer: Self-pay | Admitting: Psychiatry

## 2021-03-23 ENCOUNTER — Ambulatory Visit (INDEPENDENT_AMBULATORY_CARE_PROVIDER_SITE_OTHER): Payer: Medicaid Other | Admitting: Psychiatry

## 2021-03-23 DIAGNOSIS — F205 Residual schizophrenia: Secondary | ICD-10-CM | POA: Diagnosis not present

## 2021-03-23 DIAGNOSIS — G2401 Drug induced subacute dyskinesia: Secondary | ICD-10-CM | POA: Diagnosis not present

## 2021-03-23 MED ORDER — BENZTROPINE MESYLATE 1 MG PO TABS: 1.0000 mg | ORAL_TABLET | Freq: Every day | ORAL | 2 refills | Status: DC

## 2021-03-23 MED ORDER — OLANZAPINE 15 MG PO TABS: ORAL_TABLET | ORAL | 2 refills | Status: DC

## 2021-03-23 NOTE — Progress Notes (Signed)
BH MD/PA/NP OP Progress Note    03/23/2021 11:46 AM Alison [name]  MRN:  [id number]  Chief Complaint: " God has healed me" Chief Complaint    med eval     HPI: 60 year old female seen today for follow up psychiatric evaluation. She has a psychiatric history of schizophrenia and is currently being managed on Depakote 500 mg daily, Zyprexa 15 mg daily, and cogentin 1 mg daily. Patient notes that her medications are  effective in managing her psychiatric conditions however notes that she would like to discontinue Depakote.  Today patient is well-groomed, pleasant, cooperative, engaged in conversation, and maintained eye contact. She informed provider that she has been doing well since her last visit.  She notes that her mood continues to be stable with reduction of Depakote and notes that she would like to discontinue it at this time.  Patient informed provider that she feels that God has healed her because she is no longer drooling or falling.  Provider conducted did an aims assessment and noted slight tremors (tongue, and hand).  Patient scored a 2 on her assessment.  Today she notes that her anxiety and depression are minimal.  Provider conducted a PHQ-9 and patient scored a 1, at her last visit she scored a 5.  Provider also conducted a GAD-7 and patient scored a 1, at her last visit she scored a 6.  Today she denies SI/HI/VAH or paranoia.  She endorses adequate appetite noting that she eats a lot and cannot wait until her birthday so that she can celebrate with a big ice cream and a big step.  Today she is agreeable to discontinuing Depakote 500 mg daily.  She will continue all other medications as prescribed.  No other concerns noted at this time.   Visit Diagnosis:    ICD-10-CM   1. DISORDER, SCHIZO, RESIDUAL TYPE, CHRONIC  F20.5 OLANZapine (ZYPREXA) 15 MG tablet  2. Tardive dyskinesia  G24.01 benztropine (COGENTIN) 1 MG tablet    Past Psychiatric History: Schizophrenia  Past Medical  History:  Past Medical History:  Diagnosis Date  . Anemia   . Cancer ([place])   . Depression    chronic   . Diabetes mellitus without complication ([place])    type II  . Gallstones   . GERD (gastroesophageal reflux disease)   . History of radiation therapy 07/25/16- 09/05/16   Right Breast  . Hyperlipidemia   . Hypertension   . Personal history of chemotherapy   . Personal history of radiation therapy   . Schizophrenia [facility])     Past Surgical History:  Procedure Laterality Date  . ABDOMINAL HYSTERECTOMY    . BREAST LUMPECTOMY Right 2017  . BREAST LUMPECTOMY WITH RADIOACTIVE SEED AND SENTINEL LYMPH NODE BIOPSY Right 01/20/2016   Procedure: RIGHT BREAST LUMPECTOMY WITH RADIOACTIVE SEED AND SENTINEL LYMPH NODE BIOPSY;  Surgeon: Coralie Keens, MD;  Location: [place];  Service: General;  Laterality: Right;  . CHOLECYSTECTOMY N/A 04/16/2014   Procedure: LAPAROSCOPIC CHOLECYSTECTOMY ;  Surgeon: [name]. Georgette Dover, MD;  [facility];  Service: General;  Laterality: N/A;  . DEBULKING N/A 06/01/2017   Procedure: POSSIBLE DEBULKING;  Surgeon: Janie Morning, MD;  [facility];  Service: Gynecology;  Laterality: N/A;  . IR RADIOLOGIST EVAL & MGMT  06/27/2017  . PORT-A-CATH REMOVAL Left 10/13/2016   Procedure: REMOVAL PORT-A-CATH;  Surgeon: Coralie Keens, MD;  [facility];  Service: General;  Laterality: Left;  . PORTACATH PLACEMENT Left 01/20/2016  Procedure: INSERTION PORT-A-CATH;  Surgeon: Coralie Keens, MD;  Location: [place];  Service: General;  Laterality: Left;  . ROBOTIC ASSISTED BILATERAL SALPINGO OOPHERECTOMY N/A 06/01/2017   Procedure: XI ROBOTIC ASSISTED BILATERAL SALPINGO OOPHORECTOMY AND PELVIC WASHINGS;  Surgeon: Janie Morning, MD;  [facility];  Service: Gynecology;  Laterality: N/A;    Family Psychiatric History: Brother autism  Family History:  Family History  Problem Relation Age of Onset  . Breast cancer Mother 84        s/p partial mastectomy  . Skin cancer Mother        on right knee; unspecified type  . Colon polyps Mother        three total  . Other Mother 109       history of a TAH-BSO for bleeding  . Prostate cancer Maternal Uncle        (x2 maternal uncles)  . Cirrhosis Maternal Grandfather        +EtOH  . Cancer Brother 55       vocal cord ca; not a smoker  . Autism Brother   . Cancer Maternal Uncle        vocal cord ca; +smoker  . Ovarian cancer Maternal Aunt        dx. >50  . Cancer Maternal Aunt        unspecified type  . Diabetes Maternal Aunt   . Colon cancer Cousin        (x2 maternal first cousins--sisters)  . Breast cancer Cousin        dx. 27 or younger  . Cancer Other        NOS cancer; lim info; all three of his daughters had cancer as well  . Breast cancer Other     Social History:  Social History   Socioeconomic History  . Marital status: Single    Spouse name: Not on file  . Number of children: Not on file  . Years of education: Not on file  . Highest education level: Not on file  Occupational History  . Not on file  Tobacco Use  . Smoking status: Former Smoker    Packs/day: 0.25    Types: Cigarettes    Quit date: 12/2015    Years since quitting: 5.2  . Smokeless tobacco: Never Used  Vaping Use  . Vaping Use: Never used  Substance and Sexual Activity  . Alcohol use: Yes    Comment: "only on special occasions"   . Drug use: No  . Sexual activity: Yes    Birth control/protection: Surgical  Other Topics Concern  . Not on file  Social History Narrative  . Not on file   Social Determinants of Health   Financial Resource Strain: Not on file  Food Insecurity: Not on file  Transportation Needs: Not on file  Physical Activity: Not on file  Stress: Not on file  Social Connections: Not on file    Allergies: No Known Allergies  Metabolic Disorder Labs: Lab Results  Component Value Date   HGBA1C 6.4 (H) 05/25/2017   MPG 137 05/25/2017   No results  found for: PROLACTIN No results found for: CHOL, TRIG, HDL, CHOLHDL, VLDL, LDLCALC No results found for: TSH  Therapeutic Level Labs: No results found for: LITHIUM No results found for: VALPROATE No components found for:  CBMZ  Current Medications: Current Outpatient Medications  Medication Sig Dispense Refill  . carvedilol (COREG) 3.125 MG tablet TAKE 1 TABLET BY MOUTH TWICE DAILY.  60 tablet 3  . cromolyn (OPTICROM) 4 % ophthalmic solution Place 1 drop into both eyes 4 (four) times daily.    . cycloSPORINE (RESTASIS) 0.05 % ophthalmic emulsion Place 1 drop into both eyes 2 (two) times daily.    . furosemide (LASIX) 40 MG tablet Take 40 mg by mouth every morning.    Marland Kitchen lisinopril (PRINIVIL,ZESTRIL) 10 MG tablet Take 10 mg by mouth every morning.     . metFORMIN (GLUCOPHAGE) 500 MG tablet Take 500 mg by mouth 2 (two) times daily with a meal.     . omeprazole (PRILOSEC) 20 MG capsule Take 40 mg by mouth every morning.    . simvastatin (ZOCOR) 10 MG tablet Take 10 mg by mouth at bedtime.    . benztropine (COGENTIN) 1 MG tablet Take 1 tablet (1 mg total) by mouth daily. 30 tablet 2  . OLANZapine (ZYPREXA) 15 MG tablet TAKE 1 TABLET BY MOUTH AT BEDTIME FOR MOOD 30 tablet 2   No current facility-administered medications for this visit.     Musculoskeletal: Strength & Muscle Tone: within normal limits Gait & Station: normal Patient leans: N/A  Psychiatric Specialty Exam: Review of Systems  Blood pressure 109/85, pulse (!) 104, height 5' 1.5" (1.562 m), weight 150 lb (68 kg), SpO2 99 %.Body mass index is 27.88 kg/m.  General Appearance: Well Groomed  Eye Contact:  Good  Speech:  Clear and Coherent and Normal Rate  Volume:  Normal  Mood:  Euthymic  Affect:  Appropriate and Congruent  Thought Process:  Coherent, Goal Directed and Linear  Orientation:  Full (Time, Place, and Person)  Thought Content: WDL and Logical   Suicidal Thoughts:  No  Homicidal Thoughts:  No  Memory:   Immediate;   Good Recent;   Good Remote;   Good  Judgement:  Good  Insight:  Good  Psychomotor Activity:  Normal  Concentration:  Concentration: Good and Attention Span: Good  Recall:  Good  Fund of Knowledge: Good  Language: Good  Akathisia:  No  Handed:  Right  AIMS (if indicated): Done- Score 2  Assets:  Communication Skills Desire for Improvement Financial Resources/Insurance Housing Leisure Time Social Support  ADL's:  Intact  Cognition: WNL  Sleep:  Good   Screenings: AIMS   Flowsheet Row Video Visit from 02/18/2021 in [place] from 11/18/2020 in [place] Total Score 1 8    GAD-7   Flowsheet Row Clinical Support from 03/23/2021 in [facility] Video Visit from 02/18/2021 in [place] from 11/18/2020 in [facility]  Total GAD-7 Score 1 6 2     PHQ2-9   [place] from 03/23/2021 in [facility] Video Visit from 02/18/2021 in [place] from 11/18/2020 in [place] from 01/06/2016 in [place]  PHQ-2 Total Score 0 2 0 0  PHQ-9 Total Score 1 5 1  --    Flowsheet Row Clinical Support from 03/23/2021 in [facility] Video Visit from 02/18/2021 in [place] Error: Question 1 not populated Error: Q3, 4, or 5 should not be populated when Q2 is No       Assessment and Plan: Patient notes that she is doing well on her current medication regimen.  She however  informed provider that she would like to discontinue Depakote because her mood is stable.  She will continue all other medication as prescribed. 1. Tardive dyskinesia  Continue- benztropine  (COGENTIN) 1 MG tablet; Take 1 tablet (1 mg total) by mouth daily.  Dispense: 30 tablet; Refill: 2  2. DISORDER, SCHIZO, RESIDUAL TYPE, CHRONIC  Continue- OLANZapine (ZYPREXA) 15 MG tablet; TAKE 1 TABLET BY MOUTH AT BEDTIME FOR MOOD  Dispense: 30 tablet; Refill: 2   Follow-up in 3 months  Salley Slaughter, NP 03/23/2021, 11:46 AM

## 2021-03-25 ENCOUNTER — Other Ambulatory Visit: Payer: Self-pay

## 2021-03-25 ENCOUNTER — Ambulatory Visit
Admission: RE | Admit: 2021-03-25 | Discharge: 2021-03-25 | Disposition: A | Discharge status: Home / Self Care | Payer: Medicaid Other | Source: Ambulatory Visit | Attending: Hematology and Oncology | Admitting: Hematology and Oncology

## 2021-03-25 DIAGNOSIS — Z9889 Other specified postprocedural states: Secondary | ICD-10-CM

## 2021-06-21 ENCOUNTER — Telehealth (HOSPITAL_COMMUNITY): Payer: Medicaid Other | Admitting: Psychiatry

## 2021-08-10 ENCOUNTER — Telehealth (HOSPITAL_COMMUNITY): Payer: Self-pay | Admitting: Psychiatry

## 2021-08-10 NOTE — Telephone Encounter (Signed)
Alison [name] [phone] @ [place] facility request provider use email for virtual visits: [email or web address]. 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

## 2021-08-10 NOTE — Telephone Encounter (Signed)
Provider called Alison [name] and informed her that the correct email address was on file and confirmed the address as [email or web address].  Provider informed patient that if the email address is not responding to within 5 minutes provider calls to patient.  She endorsed understanding and agreed.  No other concerns noted at this time.

## 2021-08-11 ENCOUNTER — Telehealth (INDEPENDENT_AMBULATORY_CARE_PROVIDER_SITE_OTHER): Payer: Medicaid Other | Admitting: Psychiatry

## 2021-08-11 ENCOUNTER — Other Ambulatory Visit: Payer: Self-pay

## 2021-08-11 ENCOUNTER — Encounter (HOSPITAL_COMMUNITY): Payer: Self-pay | Admitting: Psychiatry

## 2021-08-11 DIAGNOSIS — G2401 Drug induced subacute dyskinesia: Secondary | ICD-10-CM | POA: Diagnosis not present

## 2021-08-11 DIAGNOSIS — F209 Schizophrenia, unspecified: Secondary | ICD-10-CM | POA: Diagnosis not present

## 2021-08-11 MED ORDER — OLANZAPINE 15 MG PO TABS: ORAL_TABLET | ORAL | 3 refills | Status: DC

## 2021-08-11 MED ORDER — BENZTROPINE MESYLATE 1 MG PO TABS: 1.0000 mg | ORAL_TABLET | Freq: Every day | ORAL | 3 refills | Status: DC

## 2021-08-11 NOTE — Progress Notes (Signed)
BH MD/PA/NP OP Progress Note                  Virtual Visit via Video Note  I connected with [name] on 08/11/21 at  1:00 PM EDT by a video enabled telemedicine application and verified that I am speaking with the correct person using two identifiers.  Location: Patient: Home Provider: Clinic   I discussed the limitations of evaluation and management by telemedicine and the availability of in person appointments. The patient expressed understanding and agreed to proceed.  I provided 30 minutes of non-face-to-face time during this encounter.     08/11/2021 1:19 PM Enid Maultsby  MRN:  [id number]  Chief Complaint: "Im doing fine"   HPI: 60 year old female seen today for follow up psychiatric evaluation. She has a psychiatric history of schizophrenia and is currently being managed on Zyprexa 15 mg daily and cogentin 1 mg daily. Patient notes that her medications are  effective in managing her psychiatric conditions.  Today patient is well-groomed, pleasant, cooperative, engaged in conversation, and maintained eye contact. She informed provider that she has been doing fine since her last visit.  She notes that she has been busy writing letters to her best friend.  She informed provider that she enjoys getting letters back from her friend.  Patient was seen with one of her facility staff members who notes that she has been doing well.  She notes that she is pleasant and reports that her mood has been stable.  Patient denies symptoms of anxiety and depression.  She endorses adequate sleep and appetite.  Patient jokingly said that she sleeps like a log and eats like a horse.  She informed Probation officer that she has lost some weight since her last visit but is unsure of how much she has lost.  Today she denies SI/HI/AH.  She does endorse visual hallucinations.  She notes that at times she sees 3 witches is in her room however notes that they do not bother her and she is able to cope with it.     No medication changes made today.  Patient agreeable to continue medications as prescribed. Visit Diagnosis:    ICD-10-CM   1. Schizophrenia in remission ([place])  F20.9 OLANZapine (ZYPREXA) 15 MG tablet    2. Tardive dyskinesia  G24.01 benztropine (COGENTIN) 1 MG tablet      Past Psychiatric History: Schizophrenia  Past Medical History:  Past Medical History:  Diagnosis Date   Anemia    Cancer ([place])    Depression    chronic    Diabetes mellitus without complication ([place])    type II   Gallstones    GERD (gastroesophageal reflux disease)    History of radiation therapy 07/25/16- 09/05/16   Right Breast   Hyperlipidemia    Hypertension    Personal history of chemotherapy    Personal history of radiation therapy    Schizophrenia ([place])     Past Surgical History:  Procedure Laterality Date   ABDOMINAL HYSTERECTOMY     BREAST LUMPECTOMY Right 2017   BREAST LUMPECTOMY WITH RADIOACTIVE SEED AND SENTINEL LYMPH NODE BIOPSY Right 01/20/2016   Procedure: RIGHT BREAST LUMPECTOMY WITH RADIOACTIVE SEED AND SENTINEL LYMPH NODE BIOPSY;  Surgeon: Coralie Keens, MD;  Location: [place];  Service: General;  Laterality: Right;   CHOLECYSTECTOMY N/A 04/16/2014   Procedure: LAPAROSCOPIC CHOLECYSTECTOMY ;  Surgeon: [name]. Georgette Dover, MD;  [facility];  Service: General;  Laterality: N/A;   DEBULKING  N/A 06/01/2017   Procedure: POSSIBLE DEBULKING;  Surgeon: Janie Morning, MD;  [facility];  Service: Gynecology;  Laterality: N/A;   IR RADIOLOGIST EVAL & MGMT  06/27/2017   PORT-A-CATH REMOVAL Left 10/13/2016   Procedure: REMOVAL PORT-A-CATH;  Surgeon: Coralie Keens, MD;  [facility];  Service: General;  Laterality: Left;   PORTACATH PLACEMENT Left 01/20/2016   Procedure: INSERTION PORT-A-CATH;  Surgeon: Coralie Keens, MD;  Location: [place];  Service: General;  Laterality: Left;   ROBOTIC ASSISTED BILATERAL SALPINGO OOPHERECTOMY N/A 06/01/2017    Procedure: XI ROBOTIC ASSISTED BILATERAL SALPINGO OOPHORECTOMY AND PELVIC WASHINGS;  Surgeon: Janie Morning, MD;  [facility];  Service: Gynecology;  Laterality: N/A;    Family Psychiatric History: Brother autism  Family History:  Family History  Problem Relation Age of Onset   Breast cancer Mother 28       s/p partial mastectomy   Skin cancer Mother        on right knee; unspecified type   Colon polyps Mother        three total   Other Mother 42       history of a TAH-BSO for bleeding   Prostate cancer Maternal Uncle        (x2 maternal uncles)   Cirrhosis Maternal Grandfather        +EtOH   Cancer Brother 39       vocal cord ca; not a smoker   Autism Brother    Cancer Maternal Uncle        vocal cord ca; +smoker   Ovarian cancer Maternal Aunt        dx. >50   Cancer Maternal Aunt        unspecified type   Diabetes Maternal Aunt    Colon cancer Cousin        (x2 maternal first cousins--sisters)   Breast cancer Cousin        dx. 54 or younger   Cancer Other        NOS cancer; lim info; all three of his daughters had cancer as well   Breast cancer Other     Social History:  Social History   Socioeconomic History   Marital status: Single    Spouse name: Not on file   Number of children: Not on file   Years of education: Not on file   Highest education level: Not on file  Occupational History   Not on file  Tobacco Use   Smoking status: Former    Packs/day: 0.25    Types: Cigarettes    Quit date: 12/2015    Years since quitting: 5.6   Smokeless tobacco: Never  Vaping Use   Vaping Use: Never used  Substance and Sexual Activity   Alcohol use: Yes    Comment: "only on special occasions"    Drug use: No   Sexual activity: Yes    Birth control/protection: Surgical  Other Topics Concern   Not on file  Social History Narrative   Not on file   Social Determinants of Health   Financial Resource Strain: Not on file  Food Insecurity: Not on file   Transportation Needs: Not on file  Physical Activity: Not on file  Stress: Not on file  Social Connections: Not on file    Allergies: No Known Allergies  Metabolic Disorder Labs: Lab Results  Component Value Date   HGBA1C 6.4 (H) 05/25/2017   MPG 137 05/25/2017   No results  found for: PROLACTIN No results found for: CHOL, TRIG, HDL, CHOLHDL, VLDL, LDLCALC No results found for: TSH  Therapeutic Level Labs: No results found for: LITHIUM No results found for: VALPROATE No components found for:  CBMZ  Current Medications: Current Outpatient Medications  Medication Sig Dispense Refill   benztropine (COGENTIN) 1 MG tablet Take 1 tablet (1 mg total) by mouth daily. 30 tablet 3   carvedilol (COREG) 3.125 MG tablet TAKE 1 TABLET BY MOUTH TWICE DAILY. 60 tablet 3   cromolyn (OPTICROM) 4 % ophthalmic solution Place 1 drop into both eyes 4 (four) times daily.     cycloSPORINE (RESTASIS) 0.05 % ophthalmic emulsion Place 1 drop into both eyes 2 (two) times daily.     furosemide (LASIX) 40 MG tablet Take 40 mg by mouth every morning.     lisinopril (PRINIVIL,ZESTRIL) 10 MG tablet Take 10 mg by mouth every morning.      metFORMIN (GLUCOPHAGE) 500 MG tablet Take 500 mg by mouth 2 (two) times daily with a meal.      OLANZapine (ZYPREXA) 15 MG tablet TAKE 1 TABLET BY MOUTH AT BEDTIME FOR MOOD 30 tablet 3   omeprazole (PRILOSEC) 20 MG capsule Take 40 mg by mouth every morning.     simvastatin (ZOCOR) 10 MG tablet Take 10 mg by mouth at bedtime.     No current facility-administered medications for this visit.     Musculoskeletal: Strength & Muscle Tone:  Unable to assess due to telehealth visit [place]:  Unable to assess due to telehealth visit Patient leans: N/A  Psychiatric Specialty Exam: Review of Systems  There were no vitals taken for this visit.There is no height or weight on file to calculate BMI.  General Appearance: Well Groomed  Eye Contact:  Good  Speech:  Clear and  Coherent and Normal Rate  Volume:  Normal  Mood:  Euthymic  Affect:  Appropriate and Congruent  Thought Process:  Coherent, Goal Directed and Linear  Orientation:  Full (Time, Place, and Person)  Thought Content: Logical and Hallucinations: Visual   Suicidal Thoughts:  No  Homicidal Thoughts:  No  Memory:  Immediate;   Good Recent;   Good Remote;   Good  Judgement:  Good  Insight:  Good  Psychomotor Activity:  Normal  Concentration:  Concentration: Good and Attention Span: Good  Recall:  Good  Fund of Knowledge: Good  Language: Good  Akathisia:  No  Handed:  Right  AIMS (if indicated): Done- Score 2  Assets:  Communication Skills Desire for Improvement Financial Resources/Insurance Housing Leisure Time Social Support  ADL's:  Intact  Cognition: WNL  Sleep:  Good   Screenings: AIMS    Flowsheet Row Clinical Support from 03/23/2021 in [facility] Video Visit from 02/18/2021 in [place] from 11/18/2020 in [place] Total Score 2 1 8       GAD-7    Flowsheet Row Clinical Support from 03/23/2021 in [facility] Video Visit from 02/18/2021 in [place] from 11/18/2020 in [facility]  Total GAD-7 Score 1 6 2       PHQ2-9    Flowsheet Row Clinical Support from 03/23/2021 in [facility] Video Visit from 02/18/2021 in [place] from 11/18/2020 in [place] from 01/06/2016 in [place]  Oncology  PHQ-2 Total Score 0 2 0 0  PHQ-9 Total Score 1 5 1  --      Flowsheet Row Clinical Support from 03/23/2021 in [facility] Video Visit from 02/18/2021 in [place] Error: Question 1 not populated Error: Q3, 4, or 5 should not be populated when Q2 is No        Assessment and Plan: Patient notes that she is doing well on her current medication regimen.  No medication changes made today.  Patient agreeable to continue medications as prescribed.    1. Tardive dyskinesia  Continue- benztropine (COGENTIN) 1 MG tablet; Take 1 tablet (1 mg total) by mouth daily.  Dispense: 30 tablet; Refill: 3  2. Schizophrenia in remission ([place])  Continue- OLANZapine (ZYPREXA) 15 MG tablet; TAKE 1 TABLET BY MOUTH AT BEDTIME FOR MOOD  Dispense: 30 tablet; Refill: 3     Follow-up in 3 months  Salley Slaughter, NP 08/11/2021, 1:19 PM

## 2021-09-07 ENCOUNTER — Other Ambulatory Visit: Payer: Self-pay

## 2021-09-07 ENCOUNTER — Emergency Department (HOSPITAL_COMMUNITY): Payer: Medicaid Other

## 2021-09-07 ENCOUNTER — Encounter (HOSPITAL_COMMUNITY): Payer: Self-pay

## 2021-09-07 ENCOUNTER — Inpatient Hospital Stay (HOSPITAL_COMMUNITY)
Admission: EM | Admit: 2021-09-07 | Discharge: 2021-09-09 | DRG: 054 | Disposition: A | Discharge status: Hospice | Payer: Medicaid Other | Attending: Internal Medicine | Admitting: Internal Medicine

## 2021-09-07 DIAGNOSIS — Z9049 Acquired absence of other specified parts of digestive tract: Secondary | ICD-10-CM

## 2021-09-07 DIAGNOSIS — R569 Unspecified convulsions: Secondary | ICD-10-CM | POA: Diagnosis not present

## 2021-09-07 DIAGNOSIS — Z20822 Contact with and (suspected) exposure to covid-19: Secondary | ICD-10-CM | POA: Diagnosis present

## 2021-09-07 DIAGNOSIS — Z66 Do not resuscitate: Secondary | ICD-10-CM | POA: Diagnosis not present

## 2021-09-07 DIAGNOSIS — E119 Type 2 diabetes mellitus without complications: Secondary | ICD-10-CM | POA: Diagnosis present

## 2021-09-07 DIAGNOSIS — Z9221 Personal history of antineoplastic chemotherapy: Secondary | ICD-10-CM

## 2021-09-07 DIAGNOSIS — F32A Depression, unspecified: Secondary | ICD-10-CM | POA: Diagnosis present

## 2021-09-07 DIAGNOSIS — G2401 Drug induced subacute dyskinesia: Secondary | ICD-10-CM | POA: Diagnosis present

## 2021-09-07 DIAGNOSIS — C50411 Malignant neoplasm of upper-outer quadrant of right female breast: Secondary | ICD-10-CM | POA: Diagnosis not present

## 2021-09-07 DIAGNOSIS — Z7189 Other specified counseling: Secondary | ICD-10-CM | POA: Diagnosis not present

## 2021-09-07 DIAGNOSIS — Z923 Personal history of irradiation: Secondary | ICD-10-CM | POA: Diagnosis not present

## 2021-09-07 DIAGNOSIS — Z90710 Acquired absence of both cervix and uterus: Secondary | ICD-10-CM | POA: Diagnosis not present

## 2021-09-07 DIAGNOSIS — G9341 Metabolic encephalopathy: Secondary | ICD-10-CM | POA: Diagnosis present

## 2021-09-07 DIAGNOSIS — R400 Somnolence: Secondary | ICD-10-CM | POA: Insufficient documentation

## 2021-09-07 DIAGNOSIS — G936 Cerebral edema: Secondary | ICD-10-CM | POA: Diagnosis present

## 2021-09-07 DIAGNOSIS — Z7984 Long term (current) use of oral hypoglycemic drugs: Secondary | ICD-10-CM

## 2021-09-07 DIAGNOSIS — Z853 Personal history of malignant neoplasm of breast: Secondary | ICD-10-CM | POA: Diagnosis not present

## 2021-09-07 DIAGNOSIS — R52 Pain, unspecified: Secondary | ICD-10-CM

## 2021-09-07 DIAGNOSIS — K219 Gastro-esophageal reflux disease without esophagitis: Secondary | ICD-10-CM | POA: Diagnosis present

## 2021-09-07 DIAGNOSIS — R918 Other nonspecific abnormal finding of lung field: Secondary | ICD-10-CM | POA: Diagnosis not present

## 2021-09-07 DIAGNOSIS — Z803 Family history of malignant neoplasm of breast: Secondary | ICD-10-CM | POA: Diagnosis not present

## 2021-09-07 DIAGNOSIS — C7931 Secondary malignant neoplasm of brain: Principal | ICD-10-CM | POA: Diagnosis present

## 2021-09-07 DIAGNOSIS — G9389 Other specified disorders of brain: Secondary | ICD-10-CM

## 2021-09-07 DIAGNOSIS — Z515 Encounter for palliative care: Secondary | ICD-10-CM | POA: Diagnosis not present

## 2021-09-07 DIAGNOSIS — F209 Schizophrenia, unspecified: Secondary | ICD-10-CM | POA: Diagnosis present

## 2021-09-07 DIAGNOSIS — Z171 Estrogen receptor negative status [ER-]: Secondary | ICD-10-CM | POA: Diagnosis not present

## 2021-09-07 DIAGNOSIS — C3490 Malignant neoplasm of unspecified part of unspecified bronchus or lung: Secondary | ICD-10-CM | POA: Diagnosis present

## 2021-09-07 DIAGNOSIS — L89151 Pressure ulcer of sacral region, stage 1: Secondary | ICD-10-CM | POA: Diagnosis present

## 2021-09-07 DIAGNOSIS — Z833 Family history of diabetes mellitus: Secondary | ICD-10-CM | POA: Diagnosis not present

## 2021-09-07 DIAGNOSIS — Z87891 Personal history of nicotine dependence: Secondary | ICD-10-CM

## 2021-09-07 DIAGNOSIS — E785 Hyperlipidemia, unspecified: Secondary | ICD-10-CM | POA: Diagnosis present

## 2021-09-07 DIAGNOSIS — I10 Essential (primary) hypertension: Secondary | ICD-10-CM | POA: Diagnosis present

## 2021-09-07 DIAGNOSIS — L8990 Pressure ulcer of unspecified site, unspecified stage: Secondary | ICD-10-CM | POA: Diagnosis present

## 2021-09-07 DIAGNOSIS — Z79899 Other long term (current) drug therapy: Secondary | ICD-10-CM

## 2021-09-07 LAB — COMPREHENSIVE METABOLIC PANEL
ALT: 17 U/L (ref 0–44)
AST: 23 U/L (ref 15–41)
Albumin: 3.8 g/dL (ref 3.5–5.0)
Alkaline Phosphatase: 90 U/L (ref 38–126)
Anion gap: 11 (ref 5–15)
BUN: 30 mg/dL — ABNORMAL HIGH (ref 6–20)
CO2: 24 mmol/L (ref 22–32)
Calcium: 8 mg/dL — ABNORMAL LOW (ref 8.9–10.3)
Chloride: 104 mmol/L (ref 98–111)
Creatinine, Ser: 1.48 mg/dL — ABNORMAL HIGH (ref 0.44–1.00)
GFR, Estimated: 40 mL/min — ABNORMAL LOW (ref >60)
Glucose, Bld: 99 mg/dL (ref 70–99)
Potassium: 3.8 mmol/L (ref 3.5–5.1)
Sodium: 139 mmol/L (ref 135–145)
Total Bilirubin: 0.6 mg/dL (ref 0.3–1.2)
Total Protein: 7.1 g/dL (ref 6.5–8.1)

## 2021-09-07 LAB — GLUCOSE, CAPILLARY: Glucose-Capillary: 172 mg/dL — ABNORMAL HIGH (ref 70–99)

## 2021-09-07 LAB — CBC WITH DIFFERENTIAL/PLATELET
Abs Immature Granulocytes: 0.03 10*3/uL (ref 0.00–0.07)
Basophils Absolute: 0 10*3/uL (ref 0.0–0.1)
Basophils Relative: 0 %
Eosinophils Absolute: 0.1 10*3/uL (ref 0.0–0.5)
Eosinophils Relative: 1 %
HCT: 33 % — ABNORMAL LOW (ref 36.0–46.0)
Hemoglobin: 10.9 g/dL — ABNORMAL LOW (ref 12.0–15.0)
Immature Granulocytes: 0 %
Lymphocytes Relative: 25 %
Lymphs Abs: 1.9 10*3/uL (ref 0.7–4.0)
MCH: 29 pg (ref 26.0–34.0)
MCHC: 33 g/dL (ref 30.0–36.0)
MCV: 87.8 fL (ref 80.0–100.0)
Monocytes Absolute: 0.4 10*3/uL (ref 0.1–1.0)
Monocytes Relative: 5 %
Neutro Abs: 5.1 10*3/uL (ref 1.7–7.7)
Neutrophils Relative %: 69 %
Platelets: 314 10*3/uL (ref 150–400)
RBC: 3.76 MIL/uL — ABNORMAL LOW (ref 3.87–5.11)
RDW: 13.2 % (ref 11.5–15.5)
WBC: 7.5 10*3/uL (ref 4.0–10.5)
nRBC: 0 % (ref 0.0–0.2)

## 2021-09-07 LAB — RESP PANEL BY RT-PCR (FLU A&B, COVID) ARPGX2
Influenza A by PCR: NEGATIVE
Influenza B by PCR: NEGATIVE
SARS Coronavirus 2 by RT PCR: NEGATIVE

## 2021-09-07 LAB — LIPASE, BLOOD: Lipase: 40 U/L (ref 11–51)

## 2021-09-07 LAB — LACTIC ACID, PLASMA
Lactic Acid, Venous: 1.3 mmol/L (ref 0.5–1.9)
Lactic Acid, Venous: 1.2 mmol/L (ref 0.5–1.9)

## 2021-09-07 LAB — CBG MONITORING, ED: Glucose-Capillary: 125 mg/dL — ABNORMAL HIGH (ref 70–99)

## 2021-09-07 LAB — AMMONIA: Ammonia: <9 umol/L — ABNORMAL LOW (ref 9–35)

## 2021-09-07 LAB — TSH: TSH: 0.525 u[IU]/mL (ref 0.350–4.500)

## 2021-09-07 MED ORDER — CROMOLYN SODIUM 4 % OP SOLN
1.0000 [drp] | Freq: Four times a day (QID) | OPHTHALMIC | Status: DC
  Administered 2021-09-08 (×5): 1 [drp] via OPHTHALMIC
  Filled 2021-09-07: qty 10

## 2021-09-07 MED ORDER — ENOXAPARIN SODIUM 40 MG/0.4ML IJ SOSY
40.0000 mg | PREFILLED_SYRINGE | INTRAMUSCULAR | Status: DC
  Administered 2021-09-07: 40 mg via SUBCUTANEOUS
  Filled 2021-09-07: qty 0.4

## 2021-09-07 MED ORDER — INSULIN ASPART 100 UNIT/ML IJ SOLN: 0.0000 [IU] | Freq: Every day | INTRAMUSCULAR | Status: DC

## 2021-09-07 MED ORDER — ACETAMINOPHEN 650 MG RE SUPP: 650.0000 mg | Freq: Four times a day (QID) | RECTAL | Status: DC | PRN

## 2021-09-07 MED ORDER — ONDANSETRON HCL 4 MG PO TABS: 4.0000 mg | ORAL_TABLET | Freq: Four times a day (QID) | ORAL | Status: DC | PRN

## 2021-09-07 MED ORDER — CYCLOSPORINE 0.05 % OP EMUL
1.0000 [drp] | Freq: Two times a day (BID) | OPHTHALMIC | Status: DC
  Administered 2021-09-08 (×3): 1 [drp] via OPHTHALMIC
  Filled 2021-09-07 (×5): qty 30

## 2021-09-07 MED ORDER — GADOBUTROL 1 MMOL/ML IV SOLN
7.0000 mL | Freq: Once | INTRAVENOUS | Status: AC | PRN
  Administered 2021-09-07: 7 mL via INTRAVENOUS

## 2021-09-07 MED ORDER — PANTOPRAZOLE SODIUM 40 MG PO TBEC
40.0000 mg | DELAYED_RELEASE_TABLET | Freq: Every day | ORAL | Status: DC
  Administered 2021-09-07 – 2021-09-08 (×2): 40 mg via ORAL
  Filled 2021-09-07 (×3): qty 1

## 2021-09-07 MED ORDER — BENZTROPINE MESYLATE 1 MG PO TABS
1.0000 mg | ORAL_TABLET | Freq: Every day | ORAL | Status: DC
  Administered 2021-09-07 – 2021-09-08 (×2): 1 mg via ORAL
  Filled 2021-09-07 (×3): qty 1

## 2021-09-07 MED ORDER — ACETAMINOPHEN 325 MG PO TABS: 650.0000 mg | ORAL_TABLET | Freq: Four times a day (QID) | ORAL | Status: DC | PRN

## 2021-09-07 MED ORDER — DEXAMETHASONE SODIUM PHOSPHATE 10 MG/ML IJ SOLN
10.0000 mg | Freq: Once | INTRAMUSCULAR | Status: AC
  Administered 2021-09-07: 10 mg via INTRAVENOUS
  Filled 2021-09-07: qty 1

## 2021-09-07 MED ORDER — DEXAMETHASONE SODIUM PHOSPHATE 4 MG/ML IJ SOLN
4.0000 mg | Freq: Four times a day (QID) | INTRAMUSCULAR | Status: DC
  Administered 2021-09-07 – 2021-09-09 (×8): 4 mg via INTRAVENOUS
  Filled 2021-09-07 (×8): qty 1

## 2021-09-07 MED ORDER — SODIUM CHLORIDE 0.9 % IV BOLUS
500.0000 mL | Freq: Once | INTRAVENOUS | Status: AC
  Administered 2021-09-07: 500 mL via INTRAVENOUS

## 2021-09-07 MED ORDER — INSULIN ASPART 100 UNIT/ML IJ SOLN
0.0000 [IU] | Freq: Three times a day (TID) | INTRAMUSCULAR | Status: DC
  Administered 2021-09-07: 2 [IU] via SUBCUTANEOUS
  Administered 2021-09-08: 5 [IU] via SUBCUTANEOUS
  Administered 2021-09-08: 3 [IU] via SUBCUTANEOUS
  Filled 2021-09-07: qty 1

## 2021-09-07 MED ORDER — POTASSIUM CHLORIDE IN NACL 20-0.9 MEQ/L-% IV SOLN
INTRAVENOUS | Status: DC
  Filled 2021-09-07 (×2): qty 1000

## 2021-09-07 MED ORDER — ONDANSETRON HCL 4 MG/2ML IJ SOLN: 4.0000 mg | Freq: Four times a day (QID) | INTRAMUSCULAR | Status: DC | PRN

## 2021-09-07 MED ORDER — SIMVASTATIN 20 MG PO TABS
10.0000 mg | ORAL_TABLET | Freq: Every day | ORAL | Status: DC
  Administered 2021-09-08: 10 mg via ORAL
  Filled 2021-09-07: qty 1

## 2021-09-07 MED ORDER — OLANZAPINE 2.5 MG PO TABS
15.0000 mg | ORAL_TABLET | Freq: Every day | ORAL | Status: DC
  Administered 2021-09-08 (×2): 15 mg via ORAL
  Filled 2021-09-07 (×2): qty 6

## 2021-09-07 MED ORDER — CHLORHEXIDINE GLUCONATE CLOTH 2 % EX PADS: 6.0000 | MEDICATED_PAD | Freq: Every day | CUTANEOUS | Status: DC

## 2021-09-07 MED ORDER — IOHEXOL 300 MG/ML  SOLN
75.0000 mL | Freq: Once | INTRAMUSCULAR | Status: AC | PRN
  Administered 2021-09-07: 75 mL via INTRAVENOUS

## 2021-09-07 NOTE — Progress Notes (Signed)
I have reviewed the patient's chart and spoken with ED staff at [facility]. PT presenting with lethargy, decreased responsiveness. Pt has hx of schizophrenia, lives in a group home, and has remote hx of breast CA treated with lumpectomy, chemo/rad and currently under surveillance with Dr. Lindi Adie. MRI was personally reviewed and does demonstrate a peripherally enhancing left occipital lesion with surrounding edema and partial effacement of left lateral ventricle/occipital horn. CT c/a/p also demonstrates a large lung mass concerning for primary lung CA.  Could consider close outpatient f/u for these findings with myself and Dr. Lindi Adie, however given her social situation and possible new primary malignancy, could also consider admission in which case would recommend transfer to [facility] and admission here. Would start dexamethasone 4 q6.  Consuella Lose, MD [facility]

## 2021-09-07 NOTE — ED Notes (Signed)
Pt left dept via stretcher in care of [facility].

## 2021-09-07 NOTE — ED Notes (Signed)
Pt mae spontaneously and with purpose but intermittently follows commands due to receptive dysphasia and aphasia. Notable right sided weakness with right hand grip and right leg is moderately strong, left side has moderate strong grip, left lower leg is strong. Pt will extend feet to commands but will not flex feet to command. Pt does not follow command to hold up bilateral thumbs but will intermittenty follow commands to grip bilaterally with hands. Pt has right sided neglect, eom's are intact except to right lateral gaze. Pupils: R/3 sluggish, L/2 sluggish. Face symmetrical.  When presented with NIH word card, pt cannot read the words and does not attempt to articulate the word, she stares at it. When presented with the picture card pt produces incomprehensible words.  When words on NIH word card read to pt, pt repeats some words correctly other words are repeated incomprehensibly. Pt has some episodes of echolalia.

## 2021-09-07 NOTE — ED Notes (Signed)
Spoke with [facility] transportation, they state they would only be able to transport pt is we knew an exact discharge time and if pt was being discharged before 230pm. They do not currently have after hours transportation. Called Ciara, Scientist, physiological of [facility], to give update and she voices understanding. States she would come pick pt up but she is the only staff member at the group home due to call outs and cannot leave.

## 2021-09-07 NOTE — ED Notes (Signed)
Patient transported to CT 

## 2021-09-07 NOTE — ED Triage Notes (Signed)
Pt brought in by EMS from [facility], the facility is requesting eval. States pt has altered mental status, decreased verbal response and appetite. Sleeping more than usual.

## 2021-09-07 NOTE — ED Notes (Signed)
Pt is from [facility] in [place]. [phone].

## 2021-09-07 NOTE — ED Provider Notes (Signed)
Emergency Department Provider Note   I have reviewed the triage vital signs and the nursing notes.   HISTORY  Chief Complaint Altered Mental Status   HPI Alison [name] is a 60 y.o. female with past medical history reviewed below including prior breast cancer, no longer on treatment, presents from her group home with report of increased fatigue, sleepiness, poor appetite.  They tell me she is not speaking as much as normal.  No fevers.  Patient is unable to give detailed responses to my questions.  She is sometimes answering yes/no but unclear how reliable this is.  I attempted to call the patient's mother, listed in the chart, but no response.  I spoke with the patient's caregiver, listed in the chart, who tells me that the patient has no history of active cancer and she was not aware of any mental status change or trip to the hospital today.   Level 5 caveat: AMS   Past Medical History:  Diagnosis Date   Anemia    Cancer (HCC)    Depression    chronic    Diabetes mellitus without complication (HCC)    type II   Gallstones    GERD (gastroesophageal reflux disease)    History of radiation therapy 07/25/16- 09/05/16   Right Breast   Hyperlipidemia    Hypertension    Personal history of chemotherapy    Personal history of radiation therapy    Schizophrenia [facility])     Patient Active Problem List   Diagnosis Date Noted   Pressure injury of skin 09/08/2021   Brain mass 09/07/2021   Lung mass    Somnolence    Vasogenic cerebral edema (HCC)    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) 08/11/2021   Tardive dyskinesia 08/18/2020   Postprocedural hematoma of a genitourinary system organ or structure following other procedure 06/14/2017   Pelvic fluid collection 06/14/2017   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   Genetic testing 01/29/2016   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   Breast cancer of upper-outer quadrant of right female breast (HCC) 12/24/2015   Family history of breast cancer  in mother 12/24/2015   Family history of ovarian cancer 12/24/2015   Chronic cholecystitis with calculus 04/04/2014   Diabetes (HCC) 07/24/2007   HYPERLIPIDEMIA, MIXED 07/24/2007   ANEMIA, NORMOCYTIC 07/24/2007   DISORDER, SCHIZO, RESIDUAL TYPE, CHRONIC 07/24/2007   Essential hypertension 07/24/2007   GERD 07/24/2007   DELIVERY, NORMAL 07/24/2007   HX, PERSONAL, SCHIZOPHRENIA 07/24/2007    Past Surgical History:  Procedure Laterality Date   ABDOMINAL HYSTERECTOMY     BREAST LUMPECTOMY Right 2017   BREAST LUMPECTOMY WITH RADIOACTIVE SEED AND SENTINEL LYMPH NODE BIOPSY Right 01/20/2016   Procedure: RIGHT BREAST LUMPECTOMY WITH RADIOACTIVE SEED AND SENTINEL LYMPH NODE BIOPSY;  Surgeon: Abigail Miyamoto, MD;  Location: [facility];  Service: General;  Laterality: Right;   CHOLECYSTECTOMY N/A 04/16/2014   Procedure: LAPAROSCOPIC CHOLECYSTECTOMY ;  Surgeon: Wilmon Arms. Corliss Skains, MD;  Location: [facility];  Service: General;  Laterality: N/A;   DEBULKING N/A 06/01/2017   Procedure: POSSIBLE DEBULKING;  Surgeon: Laurette Schimke, MD;  Location: [facility];  Service: Gynecology;  Laterality: N/A;   IR RADIOLOGIST EVAL & MGMT  06/27/2017   PORT-A-CATH REMOVAL Left 10/13/2016   Procedure: REMOVAL PORT-A-CATH;  Surgeon: Abigail Miyamoto, MD;  Location: [facility];  Service: General;  Laterality: Left;   PORTACATH PLACEMENT Left 01/20/2016   Procedure: INSERTION PORT-A-CATH;  Surgeon: Abigail Miyamoto, MD;  Location: [facility];  Service:  General;  Laterality: Left;   ROBOTIC ASSISTED BILATERAL SALPINGO OOPHERECTOMY N/A 06/01/2017   Procedure: XI ROBOTIC ASSISTED BILATERAL SALPINGO OOPHORECTOMY AND PELVIC WASHINGS;  Surgeon: Janie Morning, MD;  Location: [facility];  Service: Gynecology;  Laterality: N/A;    Allergies Patient has no known allergies.  Family History  Problem Relation Age of Onset   Breast cancer Mother 57       s/p partial mastectomy   Skin cancer Mother        on right  knee; unspecified type   Colon polyps Mother        three total   Other Mother 3       history of a TAH-BSO for bleeding   Prostate cancer Maternal Uncle        (x2 maternal uncles)   Cirrhosis Maternal Grandfather        +EtOH   Cancer Brother 63       vocal cord ca; not a smoker   Autism Brother    Cancer Maternal Uncle        vocal cord ca; +smoker   Ovarian cancer Maternal Aunt        dx. >50   Cancer Maternal Aunt        unspecified type   Diabetes Maternal Aunt    Colon cancer Cousin        (x2 maternal first cousins--sisters)   Breast cancer Cousin        dx. 90 or younger   Cancer Other        NOS cancer; lim info; all three of his daughters had cancer as well   Breast cancer Other     Social History Social History   Tobacco Use   Smoking status: Former    Packs/day: 0.25    Types: Cigarettes    Quit date: 12/2015    Years since quitting: 5.7   Smokeless tobacco: Never  Vaping Use   Vaping Use: Never used  Substance Use Topics   Alcohol use: Yes    Comment: "only on special occasions"    Drug use: No    Review of Systems  Level 5 caveat: AMS  ____________________________________________   PHYSICAL EXAM:  VITAL SIGNS: ED Triage Vitals  Enc Vitals Group     BP 09/07/21 0930 106/83     Pulse Rate 09/07/21 0930 (!) 104     Resp 09/07/21 0930 17     Temp 09/07/21 0930 98.5 F (36.9 C)     Temp Source 09/07/21 0930 Oral     SpO2 09/07/21 0930 99 %    Constitutional: Alert but slow to respond and mainly yes/no answers. No acute distress. Eyes: Conjunctivae are normal.  Head: Atraumatic. Nose: No congestion/rhinnorhea. Mouth/Throat: Mucous membranes are moist.  Neck: No stridor.   Cardiovascular: Tachycardia. Good peripheral circulation. Grossly normal heart sounds.   Respiratory: Normal respiratory effort.  No retractions. Lungs CTAB. Gastrointestinal: Soft and nontender. No distention.  Musculoskeletal: No gross deformities of  extremities. Neurologic: Answering yes/no only without difficulty but slow to respond at times. No gross focal neurologic deficits are appreciated.  Skin:  Skin is warm, dry and intact. No rash noted.  ____________________________________________   LABS (all labs ordered are listed, but only abnormal results are displayed)  Labs Reviewed  COMPREHENSIVE METABOLIC PANEL - Abnormal; Notable for the following components:      Result Value   BUN 30 (*)    Creatinine, Ser 1.48 (*)    Calcium 8.0 (*)  GFR, Estimated 40 (*)    All other components within normal limits  CBC WITH DIFFERENTIAL/PLATELET - Abnormal; Notable for the following components:   RBC 3.76 (*)    Hemoglobin 10.9 (*)    HCT 33.0 (*)    All other components within normal limits  AMMONIA - Abnormal; Notable for the following components:   Ammonia <9 (*)    All other components within normal limits  GLUCOSE, CAPILLARY - Abnormal; Notable for the following components:   Glucose-Capillary 172 (*)    All other components within normal limits  HEMOGLOBIN A1C - Abnormal; Notable for the following components:   Hgb A1c MFr Bld 5.9 (*)    All other components within normal limits  CBC - Abnormal; Notable for the following components:   RBC 3.33 (*)    Hemoglobin 9.4 (*)    HCT 28.1 (*)    All other components within normal limits  COMPREHENSIVE METABOLIC PANEL - Abnormal; Notable for the following components:   Glucose, Bld 153 (*)    BUN 28 (*)    Creatinine, Ser 1.38 (*)    Calcium 7.7 (*)    Albumin 3.3 (*)    GFR, Estimated 44 (*)    All other components within normal limits  GLUCOSE, CAPILLARY - Abnormal; Notable for the following components:   Glucose-Capillary 178 (*)    All other components within normal limits  GLUCOSE, CAPILLARY - Abnormal; Notable for the following components:   Glucose-Capillary 153 (*)    All other components within normal limits  GLUCOSE, CAPILLARY - Abnormal; Notable for the  following components:   Glucose-Capillary 221 (*)    All other components within normal limits  GLUCOSE, CAPILLARY - Abnormal; Notable for the following components:   Glucose-Capillary 157 (*)    All other components within normal limits  CBG MONITORING, ED - Abnormal; Notable for the following components:   Glucose-Capillary 125 (*)    All other components within normal limits  RESP PANEL BY RT-PCR (FLU A&B, COVID) ARPGX2  LIPASE, BLOOD  TSH  LACTIC ACID, PLASMA  LACTIC ACID, PLASMA  HIV ANTIBODY (ROUTINE TESTING W REFLEX)   ____________________________________________  EKG   EKG Interpretation  Date/Time:  Tuesday September 07 2021 11:13:56 EDT Ventricular Rate:  89 PR Interval:    QRS Duration: 86 QT Interval:  365 QTC Calculation: 445 R Axis:   37 Text Interpretation: Normal sinus rhythm Abnormal R-wave progression, early transition Interpretation limited secondary to artifact Confirmed by Nanda Quinton [phone]) on 09/07/2021 1:41:04 PM        ____________________________________________  RADIOLOGY  CT chest, abdomen/pelvis, along with neuro imaging reviewed.   ____________________________________________   PROCEDURES  Procedure(s) performed:   Procedures  CRITICAL CARE Performed by: Margette Fast Total critical care time: 35 minutes Critical care time was exclusive of separately billable procedures and treating other patients. Critical care was necessary to treat or prevent imminent or life-threatening deterioration. Critical care was time spent personally by me on the following activities: development of treatment plan with patient and/or surrogate as well as nursing, discussions with consultants, evaluation of patient's response to treatment, examination of patient, obtaining history from patient or surrogate, ordering and performing treatments and interventions, ordering and review of laboratory studies, ordering and review of radiographic studies, pulse  oximetry and re-evaluation of patient's condition.  Nanda Quinton, MD Emergency Medicine  ____________________________________________   INITIAL IMPRESSION / ASSESSMENT AND PLAN / ED COURSE  Pertinent labs & imaging results that were available  during my care of the patient were reviewed by me and considered in my medical decision making (see chart for details).   Patient presents to the emergency department with altered mental status.  She is coming from a group home and history is somewhat spotty.  She is not responding in a detailed way verbally.  Did not appreciate focal deficits but more seems somnolent and slow.  CT imaging shows concern for cerebral mass with vasogenic edema.  Gave Decadron and will send for MRI.  Chest x-ray with concern for possible chest mass.  Patient does have prior history of breast cancer although according to the caregiver is not under active treatment.   MRI shows vasogenic edema with 3 to 4 mm of midline shift.  Unable to reach any decision maker for the patient.  She is not deteriorating quickly here.  I do not feel she requires airway protection/intubation at this time.  CT chest/abdomen/pelvis ordered and pending.   Spoke with Dr. Kathyrn Sheriff with NSG. No immediate NSG intervention. Agrees with plan for steroid. They can discuss at tumor board on Monday but no emergent plan for surgical resection.   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 With AMS and generalized weakness do not feel she is a good candidate for discharge back to group home. Will discuss with [facility] regarding admit.   Discussed patient's case with [facility] to request admission. Patient and family (if present) updated with plan. Care transferred to [facility] service.  I reviewed all nursing notes, vitals, pertinent old records, EKGs, labs, imaging (as available).  ____________________________________________  FINAL CLINICAL IMPRESSION(S) / ED DIAGNOSES  Final diagnoses:   Vasogenic cerebral edema (HCC)  Brain mass  Lung mass  Somnolence     MEDICATIONS GIVEN DURING THIS VISIT:  Medications  sodium chloride 0.9 % bolus 500 mL (0 mLs Intravenous Stopped 09/07/21 1244)  dexamethasone (DECADRON) injection 10 mg (10 mg Intravenous Given 09/07/21 1151)  gadobutrol (GADAVIST) 1 MMOL/ML injection 7 mL (7 mLs Intravenous Contrast Given 09/07/21 1220)  iohexol (OMNIPAQUE) 300 MG/ML solution 75 mL (75 mLs Intravenous Contrast Given 09/07/21 1342)     Note:  This document was prepared using Dragon voice recognition software and may include unintentional dictation errors.  Nanda Quinton, MD, [facility]    Dosha Broshears, [facility], MD 09/10/21 [phone]

## 2021-09-07 NOTE — H&P (Signed)
History and Physical  Alison [name] YYT:[id number] DOB: 1961-05-31 DOA: 09/07/2021   PCP: Benito Mccreedy, MD   Patient coming from: Home  Chief Complaint: altered mental status  HPI:  Alison [name] is a 60 y.o. female with medical history of schizophrenia, tardive dyskinesia, diabetes mellitus type 2, right-sided breast cancer, hypertension, hyperlipidemia, vitamin D deficiency presenting with altered mental status.  The patient is unable to provide any history at this time secondary to her altered mental status.  History is obtained from review of the medical record and speaking with the EDP. The patient normally resides in a group home in [facility].  Apparently, the patient has had increasing lethargy, increasing sleepiness and decreased oral intake.  At baseline the patient is able to ambulate.  However patient has not been speaking as much as usual.  There is been no reports of fevers, nausea, vomiting, chest pain, shortness of breath. In the emergency department, the patient was afebrile and hemodynamically stable with oxygen saturation 100% on room air.  Initial CT of the brain showed edema in the left parietal lobe concerning for metastatic disease.  There was a subcentimeter left parietal calvarial lucent lesion. 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  There is also a subcentimeter focus of enhancement in the parietal bone with concerning for metastasis.  CT of the abdomen and pelvis and chest did not reveal any signs of metastasis.  However it did show that the patient had a large LLL mass measuring 5.1 x 3.0 x 4.2 cm.   BMP showed sodium 139, potassium 3.8, serum creatinine 1.48.  AST 23, ALT 17, alk phosphatase 90, total bilirubin 0.6.  WBC 7.5,  hemoglobin 10.9, platelets 214,000.  EKG shows sinus rhythm with nonspecific T wave changes.  Ammonia level was less than 9.  Lactic acid was 1.3>> 1.2.  COVID was negative.  Assessment/Plan: Brain mass with vasogenic edema and rightward midline shift -EDP discussed with neurosurgery, Dr. Clent Jacks IV dexamethasone and transfer to [facility] for further evaluation -primary may be breast vs lung  Lung Mass -09/07/2021 CT chest>> 5.1 x 3.0 x 4.2 cm mass -May need pulmonary evaluation for possible bronchoscopy versus IR consult for biopsy -Stable on room air  Acute metabolic encephalopathy -due to brain mass with vasogenic edema -obtain UA/ culture  Diabetes mellitus type 2 -Check hemoglobin A1c -Anticipate elevated CBGs secondary to steroids  Schizophrenia -Continue olanzapine and Cogentin -Holding metformin  Hyperlipidemia -Continue statin  Essential hypertension -Holding carvedilol and lisinopril secondary to soft blood pressure  Right breast cancer history -Currently in remission -Status post lumpectomy with adjuvant chemoradiation -03/25/2021 mammogram--no mammographic evidence of malignancy bilateral -follows Dr. Lindi Adie  GERD -Continue PPI  Elevated serum creatinine -last BMP on record 06/14/17--0.68 -like represents progression of renal disease rather than AKI -trend BMP -hold lisinopril -IVF x 24 hours    Past Medical History:  Diagnosis Date   Anemia    Cancer ([place])    Depression    chronic    Diabetes mellitus without complication (HCC)    type II   Gallstones    GERD (gastroesophageal reflux disease)    History of radiation therapy 07/25/16- 09/05/16   Right Breast   Hyperlipidemia    Hypertension    Personal history of chemotherapy  Personal history of radiation therapy    Schizophrenia [facility])    Past Surgical History:  Procedure Laterality Date   ABDOMINAL HYSTERECTOMY     BREAST LUMPECTOMY Right 2017   BREAST LUMPECTOMY WITH  RADIOACTIVE SEED AND SENTINEL LYMPH NODE BIOPSY Right 01/20/2016   Procedure: RIGHT BREAST LUMPECTOMY WITH RADIOACTIVE SEED AND SENTINEL LYMPH NODE BIOPSY;  Surgeon: Coralie Keens, MD;  Location: [facility];  Service: General;  Laterality: Right;   CHOLECYSTECTOMY N/A 04/16/2014   Procedure: LAPAROSCOPIC CHOLECYSTECTOMY ;  Surgeon: [name]. Georgette Dover, MD;  Location: [facility];  Service: General;  Laterality: N/A;   DEBULKING N/A 06/01/2017   Procedure: POSSIBLE DEBULKING;  Surgeon: Janie Morning, MD;  Location: [facility];  Service: Gynecology;  Laterality: N/A;   IR RADIOLOGIST EVAL & MGMT  06/27/2017   PORT-A-CATH REMOVAL Left 10/13/2016   Procedure: REMOVAL PORT-A-CATH;  Surgeon: Coralie Keens, MD;  Location: [facility];  Service: General;  Laterality: Left;   PORTACATH PLACEMENT Left 01/20/2016   Procedure: INSERTION PORT-A-CATH;  Surgeon: Coralie Keens, MD;  Location: [facility];  Service: General;  Laterality: Left;   ROBOTIC ASSISTED BILATERAL SALPINGO OOPHERECTOMY N/A 06/01/2017   Procedure: XI ROBOTIC ASSISTED BILATERAL SALPINGO OOPHORECTOMY AND PELVIC WASHINGS;  Surgeon: Janie Morning, MD;  Location: [facility];  Service: Gynecology;  Laterality: N/A;   Social History:  reports that she quit smoking about 5 years ago. Her smoking use included cigarettes. She smoked an average of .25 packs per day. She has never used smokeless tobacco. She reports current alcohol use. She reports that she does not use drugs.   Family History  Problem Relation Age of Onset   Breast cancer Mother 50       s/p partial mastectomy   Skin cancer Mother        on right knee; unspecified type   Colon polyps Mother        three total   Other Mother 1       history of a TAH-BSO for bleeding   Prostate cancer Maternal Uncle        (x2 maternal uncles)   Cirrhosis Maternal Grandfather        +EtOH   Cancer Brother 76       vocal cord ca; not a smoker   Autism Brother    Cancer Maternal  Uncle        vocal cord ca; +smoker   Ovarian cancer Maternal Aunt        dx. >50   Cancer Maternal Aunt        unspecified type   Diabetes Maternal Aunt    Colon cancer Cousin        (x2 maternal first cousins--sisters)   Breast cancer Cousin        dx. 78 or younger   Cancer Other        NOS cancer; lim info; all three of his daughters had cancer as well   Breast cancer Other      No Known Allergies   Prior to Admission medications   Medication Sig Start Date End Date Taking? Authorizing Provider  benztropine (COGENTIN) 1 MG tablet Take 1 tablet (1 mg total) by mouth daily. 08/11/21   Eulis Canner E, NP  carvedilol (COREG) 3.125 MG tablet TAKE 1 TABLET BY MOUTH TWICE DAILY. 08/17/16   Larey Dresser, MD  cromolyn (OPTICROM) 4 % ophthalmic solution Place 1 drop into both eyes 4 (four) times daily.  [provider]  cycloSPORINE (RESTASIS) 0.05 % ophthalmic emulsion Place 1 drop into both eyes 2 (two) times daily.    [provider]  furosemide (LASIX) 40 MG tablet Take 40 mg by mouth every morning.    [provider]  lisinopril (PRINIVIL,ZESTRIL) 10 MG tablet Take 10 mg by mouth every morning.     [provider]  metFORMIN (GLUCOPHAGE) 500 MG tablet Take 500 mg by mouth 2 (two) times daily with a meal.     [provider]  OLANZapine (ZYPREXA) 15 MG tablet TAKE 1 TABLET BY MOUTH AT BEDTIME FOR MOOD 08/11/21   Eulis Canner E, NP  omeprazole (PRILOSEC) 20 MG capsule Take 40 mg by mouth every morning.    [provider]  simvastatin (ZOCOR) 10 MG tablet Take 10 mg by mouth at bedtime.    [provider]    Review of Systems:  Constitutional:  Limited due to altered mental status Physical Exam: Vitals:   09/07/21 1257 09/07/21 1300 09/07/21 1400 09/07/21 1500  BP: 116/87 (!) 125/102 109/77   Pulse: 96 94 99   Resp: (!) 21 (!) _0 Temp:      TempSrc:      SpO2: 100% 99% 97%    General:  A&O x  1, NAD, nontoxic, pleasant/cooperative Head/Eye: No conjunctival hemorrhage, no icterus, [place]/AT, No nystagmus ENT:  No icterus,  No thrush, good dentition, no pharyngeal exudate Neck:  No masses, no lymphadenpathy, no bruits CV:  RRR, no rub, no gallop, no S3 Lung:  bibasilar rales. No wheeze Abdomen: soft/NT, +BS, nondistended, no peritoneal signs Ext: No cyanosis, No rashes, No petechiae, No lymphangitis, No edema Neuro: CNII-XII intact, strength 4/5 in bilateral upper and lower extremities, no dysmetria  Labs on Admission:  Basic Metabolic Panel: Recent Labs  Lab 09/07/21 1207  NA 139  K 3.8  CL 104  CO2 24  GLUCOSE 99  BUN 30*  CREATININE 1.48*  CALCIUM 8.0*   Liver Function Tests: Recent Labs  Lab 09/07/21 1207  AST 23  ALT 17  ALKPHOS 90  BILITOT 0.6  PROT 7.1  ALBUMIN 3.8   Recent Labs  Lab 09/07/21 1207  LIPASE 40   Recent Labs  Lab 09/07/21 1207  AMMONIA <9*   CBC: Recent Labs  Lab 09/07/21 1207  WBC 7.5  NEUTROABS 5.1  HGB 10.9*  HCT 33.0*  MCV 87.8  PLT 314   Coagulation Profile: No results for input(s): INR, PROTIME in the last 168 hours. Cardiac Enzymes: No results for input(s): CKTOTAL, CKMB, CKMBINDEX, TROPONINI in the last 168 hours. BNP: Invalid input(s): POCBNP CBG: No results for input(s): GLUCAP in the last 168 hours. Urine analysis:    Component Value Date/Time   COLORURINE YELLOW 06/14/2017 1100   APPEARANCEUR CLEAR 06/14/2017 1100   LABSPEC 1.010 06/14/2017 1100   PHURINE 6.0 06/14/2017 1100   GLUCOSEU NEGATIVE 06/14/2017 1100   HGBUR NEGATIVE 06/14/2017 1100   BILIRUBINUR NEGATIVE 06/14/2017 1100   KETONESUR NEGATIVE 06/14/2017 1100   PROTEINUR NEGATIVE 06/14/2017 1100   UROBILINOGEN 1.0 12/27/2007 2245   NITRITE NEGATIVE 06/14/2017 1100   LEUKOCYTESUR NEGATIVE 06/14/2017 1100   Sepsis Labs: _1 (procalcitonin:4,lacticidven:4) ) Recent Results (from the past 240 hour(s))  Resp Panel by RT-PCR (Flu A&B,  Covid) Nasopharyngeal Swab     Status: None   Collection Time: 09/07/21 10:55 AM   Specimen: Nasopharyngeal Swab; Nasopharyngeal(NP) swabs in vial transport medium  Result Value Ref Range Status  SARS Coronavirus 2 by RT PCR NEGATIVE NEGATIVE Final    Comment: (NOTE) SARS-CoV-2 target nucleic acids are NOT DETECTED.  The SARS-CoV-2 RNA is generally detectable in upper respiratory specimens during the acute phase of infection. The lowest concentration of SARS-CoV-2 viral copies this assay can detect is 138 copies/mL. A negative result does not preclude SARS-Cov-2 infection and should not be used as the sole basis for treatment or other patient management decisions. A negative result may occur with  improper specimen collection/handling, submission of specimen other than nasopharyngeal swab, presence of viral mutation(s) within the areas targeted by this assay, and inadequate number of viral copies(<138 copies/mL). A negative result must be combined with clinical observations, patient history, and epidemiological information. The expected result is Negative.  Fact Sheet for Patients:  EntrepreneurPulse.com.au  Fact Sheet for Healthcare Providers:  IncredibleEmployment.be  This test is no t yet approved or cleared by the [place] FDA and  has been authorized for detection and/or diagnosis of SARS-CoV-2 by FDA under an Emergency Use Authorization (EUA). This EUA will remain  in effect (meaning this test can be used) for the duration of the COVID-19 declaration under Section 564(b)(1) of the Act, 21 U.S.C.section 360bbb-3(b)(1), unless the authorization is terminated  or revoked sooner.       Influenza A by PCR NEGATIVE NEGATIVE Final   Influenza B by PCR NEGATIVE NEGATIVE Final    Comment: (NOTE) The Xpert Xpress SARS-CoV-2/FLU/RSV plus assay is intended as an aid in the diagnosis of influenza from Nasopharyngeal swab specimens and should  not be used as a sole basis for treatment. Nasal washings and aspirates are unacceptable for Xpert Xpress SARS-CoV-2/FLU/RSV testing.  Fact Sheet for Patients: EntrepreneurPulse.com.au  Fact Sheet for Healthcare Providers: IncredibleEmployment.be  This test is not yet approved or cleared by the [place] FDA and has been authorized for detection and/or diagnosis of SARS-CoV-2 by FDA under an Emergency Use Authorization (EUA). This EUA will remain in effect (meaning this test can be used) for the duration of the COVID-19 declaration under Section 564(b)(1) of the Act, 21 U.S.C. section 360bbb-3(b)(1), unless the authorization is terminated or revoked.  Performed at [facility], [address], [place], [place] [address]      Radiological Exams on Admission: CT HEAD WO CONTRAST (5MM)  Result Date: 09/07/2021 CLINICAL DATA:  Altered mental status EXAM: CT HEAD WITHOUT CONTRAST TECHNIQUE: Contiguous axial images were obtained from the base of the skull through the vertex without intravenous contrast. COMPARISON:  None. FINDINGS: Brain: There is hypodensity in the left parietal lobe with sparing of the overlying cortex. The remainder of the brain parenchyma is normal in appearance. There is no evidence of acute intracranial hemorrhage. There is no large vessel territorial infarct. There is partial effacement of the left lateral ventricle due to the above-described edema. There is no midline shift. Vascular: No hyperdense vessel or unexpected calcification. Skull: There is a subcentimeter lucent lesion in the left parietal calvarium (3-38). Sinuses/Orbits: The imaged paranasal sinuses are clear. The globes and orbits are unremarkable. Other: None. IMPRESSION: 1. Edema in the left parietal lobe raises concern for metastatic disease given the findings on the same day chest radiograph. Recommend brain MRI with and without contrast for further evaluation. 2.  Subcentimeter lucent lesion in the left parietal calvarium is suspicious for osseous metastatic disease. This can also be further assessed with MRI. Electronically Signed   By: Valetta Mole M.D.   On: 09/07/2021 10:34   MR ANGIO HEAD  WO CONTRAST  Result Date: 09/07/2021 CLINICAL DATA:  Metastatic disease evaluation EXAM: MRI HEAD WITHOUT AND WITH CONTRAST MRA HEAD WITHOUT CONTRAST TECHNIQUE: Multiplanar, multi-echo pulse sequences of the brain and surrounding structures were acquired without and with intravenous contrast. Angiographic images of the Circle of Willis were acquired using MRA technique without intravenous contrast. CONTRAST:  28m GADAVIST GADOBUTROL 1 MMOL/ML IV SOLN COMPARISON:  Same-day noncontrast CT head FINDINGS: MRI HEAD FINDINGS Brain: There is a 2.4 cm TV by 1.9 cm AP by 2.0 cm cc cystic and solid enhancing lesion in the left occipital lobe consistent with metastatic disease. There is significant vasogenic edema surrounding the lesion resulting in partial effacement of the left lateral ventricle and effacement of the surrounding sulci. There is 3-4 mm rightward midline shift. No other metastatic lesions are identified. There is cortically based diffusion restriction in the left parietal lobe with associated T2/FLAIR signal abnormality. There is no evidence of acute intracranial hemorrhage or extra-axial fluid collection. Vascular: See below Skull and upper cervical spine: There is a small focus of enhancement in the left parietal bone corresponding to the lucent lesion seen on the prior CT concerning for osseous metastasis. No other osseous metastatic lesions are seen. Sinuses/Orbits: The paranasal sinuses are clear. The globes and orbits are unremarkable. Other: None. MRA HEAD FINDINGS Anterior circulation: The bilateral intracranial ICAs are patent. The bilateral MCAs and ACAs are patent. There is no significant stenosis or occlusion. There is no aneurysm. Posterior circulation: The  bilateral V4 segments of the vertebral arteries are patent. The bilateral PCAs are patent. There is no significant stenosis or occlusion. There is no aneurysm. There is a prominent left posterior communicating artery. The right posterior communicating artery is not definitively identified. Anatomic variants: As above. IMPRESSION: 1. 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 2. Subcentimeter focus of enhancement in the left parietal bone concerning for osseous metastasis. 3. Cortically based diffusion restriction in the left parietal lobe consistent with early subacute ischemia which may be due to mass effect from the above described metastatic lesion. 4. Patent intracranial vasculature. Electronically Signed   By: PValetta MoleM.D.   On: 09/07/2021 13:18   MR Brain W and Wo Contrast  Result Date: 09/07/2021 CLINICAL DATA:  Metastatic disease evaluation EXAM: MRI HEAD WITHOUT AND WITH CONTRAST MRA HEAD WITHOUT CONTRAST TECHNIQUE: Multiplanar, multi-echo pulse sequences of the brain and surrounding structures were acquired without and with intravenous contrast. Angiographic images of the Circle of Willis were acquired using MRA technique without intravenous contrast. CONTRAST:  777mGADAVIST GADOBUTROL 1 MMOL/ML IV SOLN COMPARISON:  Same-day noncontrast CT head FINDINGS: MRI HEAD FINDINGS Brain: There is a 2.4 cm TV by 1.9 cm AP by 2.0 cm cc cystic and solid enhancing lesion in the left occipital lobe consistent with metastatic disease. There is significant vasogenic edema surrounding the lesion resulting in partial effacement of the left lateral ventricle and effacement of the surrounding sulci. There is 3-4 mm rightward midline shift. No other metastatic lesions are identified. There is cortically based diffusion restriction in the  left parietal lobe with associated T2/FLAIR signal abnormality. There is no evidence of acute intracranial hemorrhage or extra-axial fluid collection. Vascular: See below Skull and upper cervical spine: There is a small focus of enhancement in the left parietal bone corresponding to the lucent lesion seen on the  prior CT concerning for osseous metastasis. No other osseous metastatic lesions are seen. Sinuses/Orbits: The paranasal sinuses are clear. The globes and orbits are unremarkable. Other: None. MRA HEAD FINDINGS Anterior circulation: The bilateral intracranial ICAs are patent. The bilateral MCAs and ACAs are patent. There is no significant stenosis or occlusion. There is no aneurysm. Posterior circulation: The bilateral V4 segments of the vertebral arteries are patent. The bilateral PCAs are patent. There is no significant stenosis or occlusion. There is no aneurysm. There is a prominent left posterior communicating artery. The right posterior communicating artery is not definitively identified. Anatomic variants: As above. IMPRESSION: 1. 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 2. Subcentimeter focus of enhancement in the left parietal bone concerning for osseous metastasis. 3. Cortically based diffusion restriction in the left parietal lobe consistent with early subacute ischemia which may be due to mass effect from the above described metastatic lesion. 4. Patent intracranial vasculature. Electronically Signed   By: Valetta Mole M.D.   On: 09/07/2021 13:18   CT CHEST ABDOMEN PELVIS W CONTRAST  Result Date: 09/07/2021 CLINICAL DATA:  Brain metastasis.  Lung mass EXAM: CT CHEST, ABDOMEN, AND PELVIS WITH CONTRAST TECHNIQUE: Multidetector CT imaging of the chest, abdomen and pelvis was performed following the standard protocol  during bolus administration of intravenous contrast. CONTRAST:  22m OMNIPAQUE IOHEXOL 300 MG/ML  SOLN COMPARISON:  Radiograph 09/07/2021 FINDINGS: CT CHEST FINDINGS Cardiovascular: No significant vascular findings. Normal heart size. No pericardial effusion. Mediastinum/Nodes: No axillary or supraclavicular adenopathy. No mediastinal or hilar adenopathy. No pericardial fluid. Esophagus normal. Lungs/Pleura: LEFT lower lobe mass measures 5.1 by 3.0 x 4.2 cm. Mass abuts the posterior margin of the LEFT oblique fissure. Musculoskeletal: No aggressive osseous lesion. Postsurgical change in the lateral RIGHT breast with soft tissue thickening and surgical clips on image 25/2. CT ABDOMEN AND PELVIS FINDINGS Hepatobiliary: No focal hepatic lesion. Postcholecystectomy. No biliary dilatation. Subtle 4 mm hypodensity in the lateral LEFT hepatic lobe (image 46/2) is favored benign. Pancreas: Pancreas is normal. No ductal dilatation. No pancreatic inflammation. Spleen: Normal spleen Adrenals/urinary tract: Adrenal glands normal. Bilateral nephrolithiasis. Ureters and bladder normal Stomach/Bowel: Stomach, small bowel, appendix, and cecum are normal. The colon and rectosigmoid colon are normal. Vascular/Lymphatic: Abdominal aorta is normal caliber. There is no retroperitoneal or periportal lymphadenopathy. No pelvic lymphadenopathy. Reproductive: Post hysterectomy.  Adnexa unremarkable Other: No free fluid. Musculoskeletal: No aggressive osseous lesion. IMPRESSION: Chest Impression: 1. 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 2. No metastatic adenopathy. 3. Postsurgical change along the lateral aspect of the RIGHT breast. Abdomen / Pelvis Impression: 1. No metastatic disease in the abdomen pelvis. 2. No skeletal metastasis Electronically Signed   By: SSuzy BouchardM.D.   On: 09/07/2021 14:33   DG Chest Portable 1 View  Result Date: 09/07/2021 CLINICAL DATA:  Altered mental status EXAM:  PORTABLE CHEST 1 VIEW COMPARISON:  01/20/2016 FINDINGS: Large left midlung/perihilar mass is new since prior study measuring up to 5 cm. Right lung is clear. No effusions. Heart is normal size. Interval removal of left Port-A-Cath. IMPRESSION: Left mid lung/perihilar 5 cm mass. This could be further evaluated with chest CT with IV contrast. Electronically Signed   By: KRolm BaptiseM.D.   On: 09/07/2021 10:19    EKG: Independently reviewed. Sinus, nonspecific T wave change  Time spent:60 minutes Code Status:   FULL Family Communication:  No Family at bedside Disposition Plan: expect 2-3 day hospitalization Consults called: neurosurgery--Nundkumar DVT Prophylaxis: SCDs  Orson Eva, DO  [facility] Pager [phone]  If 7PM-7AM, please contact night-coverage [email or web address] Password [facility] 09/07/2021, 3:57 PM

## 2021-09-08 ENCOUNTER — Other Ambulatory Visit: Payer: Self-pay | Admitting: Radiation Therapy

## 2021-09-08 DIAGNOSIS — Z515 Encounter for palliative care: Secondary | ICD-10-CM | POA: Diagnosis not present

## 2021-09-08 DIAGNOSIS — Z7189 Other specified counseling: Secondary | ICD-10-CM

## 2021-09-08 DIAGNOSIS — Z171 Estrogen receptor negative status [ER-]: Secondary | ICD-10-CM

## 2021-09-08 DIAGNOSIS — G2401 Drug induced subacute dyskinesia: Secondary | ICD-10-CM | POA: Diagnosis not present

## 2021-09-08 DIAGNOSIS — F209 Schizophrenia, unspecified: Secondary | ICD-10-CM | POA: Diagnosis not present

## 2021-09-08 DIAGNOSIS — R918 Other nonspecific abnormal finding of lung field: Secondary | ICD-10-CM | POA: Diagnosis not present

## 2021-09-08 DIAGNOSIS — L8990 Pressure ulcer of unspecified site, unspecified stage: Secondary | ICD-10-CM | POA: Diagnosis present

## 2021-09-08 DIAGNOSIS — C50411 Malignant neoplasm of upper-outer quadrant of right female breast: Secondary | ICD-10-CM

## 2021-09-08 DIAGNOSIS — G9389 Other specified disorders of brain: Secondary | ICD-10-CM | POA: Diagnosis not present

## 2021-09-08 DIAGNOSIS — Z66 Do not resuscitate: Secondary | ICD-10-CM

## 2021-09-08 LAB — COMPREHENSIVE METABOLIC PANEL
ALT: 18 U/L (ref 0–44)
AST: 23 U/L (ref 15–41)
Albumin: 3.3 g/dL — ABNORMAL LOW (ref 3.5–5.0)
Alkaline Phosphatase: 80 U/L (ref 38–126)
Anion gap: 10 (ref 5–15)
BUN: 28 mg/dL — ABNORMAL HIGH (ref 6–20)
CO2: 22 mmol/L (ref 22–32)
Calcium: 7.7 mg/dL — ABNORMAL LOW (ref 8.9–10.3)
Chloride: 104 mmol/L (ref 98–111)
Creatinine, Ser: 1.38 mg/dL — ABNORMAL HIGH (ref 0.44–1.00)
GFR, Estimated: 44 mL/min — ABNORMAL LOW (ref >60)
Glucose, Bld: 153 mg/dL — ABNORMAL HIGH (ref 70–99)
Potassium: 4.1 mmol/L (ref 3.5–5.1)
Sodium: 136 mmol/L (ref 135–145)
Total Bilirubin: 0.7 mg/dL (ref 0.3–1.2)
Total Protein: 6.5 g/dL (ref 6.5–8.1)

## 2021-09-08 LAB — CBC
HCT: 28.1 % — ABNORMAL LOW (ref 36.0–46.0)
Hemoglobin: 9.4 g/dL — ABNORMAL LOW (ref 12.0–15.0)
MCH: 28.2 pg (ref 26.0–34.0)
MCHC: 33.5 g/dL (ref 30.0–36.0)
MCV: 84.4 fL (ref 80.0–100.0)
Platelets: 270 10*3/uL (ref 150–400)
RBC: 3.33 MIL/uL — ABNORMAL LOW (ref 3.87–5.11)
RDW: 12.8 % (ref 11.5–15.5)
WBC: 6 10*3/uL (ref 4.0–10.5)
nRBC: 0 % (ref 0.0–0.2)

## 2021-09-08 LAB — HEMOGLOBIN A1C
Hgb A1c MFr Bld: 5.9 % — ABNORMAL HIGH (ref 4.8–5.6)
Mean Plasma Glucose: 123 mg/dL

## 2021-09-08 LAB — GLUCOSE, CAPILLARY
Glucose-Capillary: 178 mg/dL — ABNORMAL HIGH (ref 70–99)
Glucose-Capillary: 153 mg/dL — ABNORMAL HIGH (ref 70–99)
Glucose-Capillary: 221 mg/dL — ABNORMAL HIGH (ref 70–99)
Glucose-Capillary: 157 mg/dL — ABNORMAL HIGH (ref 70–99)

## 2021-09-08 LAB — HIV ANTIBODY (ROUTINE TESTING W REFLEX): HIV Screen 4th Generation wRfx: NONREACTIVE

## 2021-09-08 MED ORDER — LORAZEPAM 1 MG PO TABS: 1.0000 mg | ORAL_TABLET | ORAL | Status: DC | PRN

## 2021-09-08 MED ORDER — MORPHINE SULFATE (PF) 2 MG/ML IV SOLN
1.0000 mg | INTRAVENOUS | Status: DC | PRN
Start: 2021-09-08 — End: 2021-09-09

## 2021-09-08 MED ORDER — HALOPERIDOL 0.5 MG PO TABS: 0.5000 mg | ORAL_TABLET | ORAL | Status: DC | PRN

## 2021-09-08 MED ORDER — LORAZEPAM 2 MG/ML IJ SOLN
1.0000 mg | INTRAMUSCULAR | Status: DC | PRN
  Administered 2021-09-09: 1 mg via INTRAVENOUS
  Filled 2021-09-08: qty 1

## 2021-09-08 MED ORDER — CARVEDILOL 3.125 MG PO TABS
3.1250 mg | ORAL_TABLET | Freq: Two times a day (BID) | ORAL | Status: DC
  Administered 2021-09-08 (×2): 3.125 mg via ORAL
  Filled 2021-09-08 (×3): qty 1

## 2021-09-08 MED ORDER — GLYCOPYRROLATE 0.2 MG/ML IJ SOLN: 0.2000 mg | INTRAMUSCULAR | Status: DC | PRN

## 2021-09-08 MED ORDER — POLYVINYL ALCOHOL 1.4 % OP SOLN: 1.0000 [drp] | Freq: Four times a day (QID) | OPHTHALMIC | Status: DC | PRN

## 2021-09-08 MED ORDER — GLYCOPYRROLATE 1 MG PO TABS
1.0000 mg | ORAL_TABLET | ORAL | Status: DC | PRN
  Filled 2021-09-08: qty 1

## 2021-09-08 MED ORDER — HALOPERIDOL LACTATE 2 MG/ML PO CONC
0.5000 mg | ORAL | Status: DC | PRN
  Filled 2021-09-08: qty 0.3

## 2021-09-08 MED ORDER — BIOTENE DRY MOUTH MT LIQD: 15.0000 mL | OROMUCOSAL | Status: DC | PRN

## 2021-09-08 MED ORDER — HALOPERIDOL LACTATE 5 MG/ML IJ SOLN: 0.5000 mg | INTRAMUSCULAR | Status: DC | PRN

## 2021-09-08 MED ORDER — LORAZEPAM 2 MG/ML PO CONC: 1.0000 mg | ORAL | Status: DC | PRN

## 2021-09-08 NOTE — Progress Notes (Signed)
Only PROGRESS NOTE    Alison [name]  TKW:[id number] DOB: 1961-03-17 DOA: 09/07/2021 PCP: Benito Mccreedy, MD    Brief Narrative:  Alison [name] is a 60 year old female with past medical history significant for schizophrenia, tardive dyskinesia, type 2 diabetes mellitus, Hx right breast cancer, essential hypertension, hyperlipidemia, vitamin D deficiency who presented initially to [facility] with altered mental status on 9/27.  Due to her mental status, history obtained from chart review, medical records and speaking with EDP.  Patient has had increasing lethargy, sleepiness and decreased oral intake.  At baseline patient able to ambulate, however patient has not been speaking as much.  No reported fevers, no nausea/vomiting, no chest pain or shortness of breath.  In the ED, patient was afebrile with SPO2 100% on room air.  Sodium 139, potassium 3.8, creatinine 1.48, AST 23, ALT 17, alk phos 90, total bilirubin 0.6.  WBC 7.5, hemoglobin 10.9, platelets 214.  EKG with normal sinus rhythm.  Ammonia level less than 9.  Lactic acid 1.3.  Covid-19/flu A/B PCR negative.  CT head with edema left parietal lobe concerning for metastatic disease.  MR/MRA brain with single intracranial metastases measuring up to 2.4 centimeters on the left occipital lobe with significant surrounding vasogenic edema resulting in partial effacement of the left lateral ventricle and 3-4 mm rightward midline shift; subcentimeter focus enhancement left parietal bone concerning for osseous metastasis, diffusion restriction left parietal lobe consistent with early subacute ischemia versus mass-effect and patent intracranial vasculature.  EDP discussed case with neurosurgery, Dr. Kathyrn Sheriff who recommended IV dexamethasone and transferred to [facility] for further evaluation.   Assessment & Plan:   Active Problems:   Breast cancer of upper-outer quadrant of right female breast ([place])   Tardive  dyskinesia   Schizophrenia in remission ([place])   Brain mass   Pressure injury of skin   Acute metabolic encephalopathy, POA Left lung mass concerning for bronchogenic carcinoma Brain mass with vasogenic edema and midline shift Patient presenting to [facility] ED with altered mental status, confusion and lethargy.  Brain imaging studies significant for single intracranial metastasis 2.4 cm left occipital lobe with surrounding vasogenic edema resulting in partial effacement of the left lateral ventricle and midline shift.  CT chest/abdomen/pelvis with large left lower lobe mass measuring 5.1 x 3.0 x 4.2 cm.  --Neurosurgery following, appreciate assistance --Discussed case with patient's mother via telephone on 9/28, discussed ultimately poor prognosis given brain metastases and recommendations for either aggressive measures with biopsy versus a more comfort approach.  Patient's mother wishes to see her daughter before deciding on further care/tests at this time. --Palliative care consult for assistance with goals of care/medical decision making --Dexamethasone 4 mg IV q6h  Elevated creatinine Last creatinine in EMR 0.68 on 06/24/2017.  Etiology likely prerenal azotemia from poor oral intake in the preceding days of hospitalization versus progression of renal disease. --Cr 1.48>1.38 --Holding home lisinopril/metformin --Continue IV fluid hydration --Avoid nephrotoxins, renally dose all medications --BMP daily  Essential hypertension --Continue carvedilol 3.125 mg p.o. twice daily --Hold lisinopril --Continue monitor BP closely  T2DM Hemoglobin A1c 6.4 on 05/25/2017.  On metformin 5 mg p.o. twice daily at home. --Hold home metformin --Repeat hemoglobin A1c: Pending --SSI for coverage --CBGs qAC/HS  Schizophrenia Tardive dyskinesia --Zyprexa 50 mg p.o. nightly --Benztropine 1 mg p.o. daily  HLD: Simvastatin 10 mg p.o. daily  GERD: Protonix 40 mg p.o. daily  Hx Breast  Cancer Patient with history of breast cancer, currently  in remission.  S/p lumpectomy with adjuvant chemoradiation.  Recent mammogram 03/25/2021 with no mammographic evidence of malignancy bilaterally.  Follows with medical oncology outpatient, Dr. Payton Mccallum.   DVT prophylaxis: enoxaparin (LOVENOX) injection 40 mg Start: 09/07/21 1800   Code Status: Full Code Family Communication: Updated patient's mother, Christine via telephone this morning  Disposition Plan:  Level of care: Med-Surg Status is: Inpatient  Remains inpatient appropriate because:Altered mental status, Ongoing diagnostic testing needed not appropriate for outpatient work up, Unsafe d/c plan, IV treatments appropriate due to intensity of illness or inability to take PO, and Inpatient level of care appropriate due to severity of illness  Dispo: The patient is from: Group home              Anticipated d/c is to:  To be determined              Patient currently is not medically stable to d/c.   Difficult to place patient No   Consultants:  Neurosurgery, Dr. Kathyrn Sheriff Palliative care  Procedures:  None  Antimicrobials:  None   Subjective: Patient seen examined at bedside, resting comfortably.  Pleasantly confused.  No family present.  Patient is alert, but does not answer questions appropriately and unable to obtain any further ROS from patient today.  Patient notably moves all extremities except for right lower extremity.  No acute concerns overnight per nursing staff.  Objective: Vitals:   09/07/21 2238 09/08/21 0346 09/08/21 0820 09/08/21 1041  BP: 111/81 129/80 (!) 121/95 122/84  Pulse: 87 74 80   Resp: $Remo'16 18 18   'NoGAU$ Temp: 98 F (36.7 C) 98.3 F (36.8 C) 98.1 F (36.7 C)   TempSrc: Axillary Axillary Oral   SpO2: 99% 99% 100%   Weight: 60.9 kg       Intake/Output Summary (Last 24 hours) at 09/08/2021 1137 Last data filed at 09/08/2021 1100 Gross per 24 hour  Intake 360 ml  Output --  Net 360 ml   Filed  Weights   09/07/21 2238  Weight: 60.9 kg    Examination:  General exam: Appears calm and comfortable, pleasantly confused, NAD Respiratory system: Clear to auscultation. Respiratory effort normal.  On room air Cardiovascular system: S1 & S2 heard, RRR. No JVD, murmurs, rubs, gallops or clicks. No pedal edema. Gastrointestinal system: Abdomen is nondistended, soft and nontender. No organomegaly or masses felt. Normal bowel sounds heard. Central nervous system: Alert, not oriented to person/place/time or situation.  Does not follow commands appropriately. Extremities: Muscle strength: Left upper/lower extremity 5/5, right upper extremity 5/5, right lower extremity 0/5.  Will move all extremities independently but not to command except for right lower extremity. Skin: No rashes, lesions or ulcers Psychiatry: Judgement and insight appear poor.     Data Reviewed: I have personally reviewed following labs and imaging studies  CBC: Recent Labs  Lab 09/07/21 1207 09/08/21 0319  WBC 7.5 6.0  NEUTROABS 5.1  --   HGB 10.9* 9.4*  HCT 33.0* 28.1*  MCV 87.8 84.4  PLT 314 827   Basic Metabolic Panel: Recent Labs  Lab 09/07/21 1207 09/08/21 0319  NA 139 136  K 3.8 4.1  CL 104 104  CO2 24 22  GLUCOSE 99 153*  BUN 30* 28*  CREATININE 1.48* 1.38*  CALCIUM 8.0* 7.7*   GFR: Estimated Creatinine Clearance: 36.8 mL/min (A) (by C-G formula based on SCr of 1.38 mg/dL (H)). Liver Function Tests: Recent Labs  Lab 09/07/21 1207 09/08/21 0319  AST 23 23  ALT 17 18  ALKPHOS 90 80  BILITOT 0.6 0.7  PROT 7.1 6.5  ALBUMIN 3.8 3.3*   Recent Labs  Lab 09/07/21 1207  LIPASE 40   Recent Labs  Lab 09/07/21 1207  AMMONIA <9*   Coagulation Profile: No results for input(s): INR, PROTIME in the last 168 hours. Cardiac Enzymes: No results for input(s): CKTOTAL, CKMB, CKMBINDEX, TROPONINI in the last 168 hours. BNP (last 3 results) No results for input(s): PROBNP in the last 8760  hours. HbA1C: No results for input(s): HGBA1C in the last 72 hours. CBG: Recent Labs  Lab 09/07/21 1653 09/07/21 2310 09/08/21 0637 09/08/21 0809  GLUCAP 125* 172* 178* 153*   Lipid Profile: No results for input(s): CHOL, HDL, LDLCALC, TRIG, CHOLHDL, LDLDIRECT in the last 72 hours. Thyroid Function Tests: Recent Labs    09/07/21 1207  TSH 0.525   Anemia Panel: No results for input(s): VITAMINB12, FOLATE, FERRITIN, TIBC, IRON, RETICCTPCT in the last 72 hours. Sepsis Labs: Recent Labs  Lab 09/07/21 1207 09/07/21 1416  LATICACIDVEN 1.3 1.2    Recent Results (from the past 240 hour(s))  Resp Panel by RT-PCR (Flu A&B, Covid) Nasopharyngeal Swab     Status: None   Collection Time: 09/07/21 10:55 AM   Specimen: Nasopharyngeal Swab; Nasopharyngeal(NP) swabs in vial transport medium  Result Value Ref Range Status   SARS Coronavirus 2 by RT PCR NEGATIVE NEGATIVE Final    Comment: (NOTE) SARS-CoV-2 target nucleic acids are NOT DETECTED.  The SARS-CoV-2 RNA is generally detectable in upper respiratory specimens during the acute phase of infection. The lowest concentration of SARS-CoV-2 viral copies this assay can detect is 138 copies/mL. A negative result does not preclude SARS-Cov-2 infection and should not be used as the sole basis for treatment or other patient management decisions. A negative result may occur with  improper specimen collection/handling, submission of specimen other than nasopharyngeal swab, presence of viral mutation(s) within the areas targeted by this assay, and inadequate number of viral copies(<138 copies/mL). A negative result must be combined with clinical observations, patient history, and epidemiological information. The expected result is Negative.  Fact Sheet for Patients:  EntrepreneurPulse.com.au  Fact Sheet for Healthcare Providers:  IncredibleEmployment.be  This test is no t yet approved or cleared by  the [place] FDA and  has been authorized for detection and/or diagnosis of SARS-CoV-2 by FDA under an Emergency Use Authorization (EUA). This EUA will remain  in effect (meaning this test can be used) for the duration of the COVID-19 declaration under Section 564(b)(1) of the Act, 21 U.S.C.section 360bbb-3(b)(1), unless the authorization is terminated  or revoked sooner.       Influenza A by PCR NEGATIVE NEGATIVE Final   Influenza B by PCR NEGATIVE NEGATIVE Final    Comment: (NOTE) The Xpert Xpress SARS-CoV-2/FLU/RSV plus assay is intended as an aid in the diagnosis of influenza from Nasopharyngeal swab specimens and should not be used as a sole basis for treatment. Nasal washings and aspirates are unacceptable for Xpert Xpress SARS-CoV-2/FLU/RSV testing.  Fact Sheet for Patients: EntrepreneurPulse.com.au  Fact Sheet for Healthcare Providers: IncredibleEmployment.be  This test is not yet approved or cleared by the [place] FDA and has been authorized for detection and/or diagnosis of SARS-CoV-2 by FDA under an Emergency Use Authorization (EUA). This EUA will remain in effect (meaning this test can be used) for the duration of the COVID-19 declaration under Section 564(b)(1) of the Act, 21 U.S.C. section 360bbb-3(b)(1), unless the authorization is terminated or  revoked.  Performed at [facility], [address] [place], [place] [address]          Radiology Studies: CT HEAD WO CONTRAST (5MM)  Result Date: 09/07/2021 CLINICAL DATA:  Altered mental status EXAM: CT HEAD WITHOUT CONTRAST TECHNIQUE: Contiguous axial images were obtained from the base of the skull through the vertex without intravenous contrast. COMPARISON:  None. FINDINGS: Brain: There is hypodensity in the left parietal lobe with sparing of the overlying cortex. The remainder of the brain parenchyma is normal in appearance. There is no evidence of acute intracranial  hemorrhage. There is no large vessel territorial infarct. There is partial effacement of the left lateral ventricle due to the above-described edema. There is no midline shift. Vascular: No hyperdense vessel or unexpected calcification. Skull: There is a subcentimeter lucent lesion in the left parietal calvarium (3-38). Sinuses/Orbits: The imaged paranasal sinuses are clear. The globes and orbits are unremarkable. Other: None. IMPRESSION: 1. Edema in the left parietal lobe raises concern for metastatic disease given the findings on the same day chest radiograph. Recommend brain MRI with and without contrast for further evaluation. 2. Subcentimeter lucent lesion in the left parietal calvarium is suspicious for osseous metastatic disease. This can also be further assessed with MRI. Electronically Signed   By: Valetta Mole M.D.   On: 09/07/2021 10:34   MR ANGIO HEAD WO CONTRAST  Result Date: 09/07/2021 CLINICAL DATA:  Metastatic disease evaluation EXAM: MRI HEAD WITHOUT AND WITH CONTRAST MRA HEAD WITHOUT CONTRAST TECHNIQUE: Multiplanar, multi-echo pulse sequences of the brain and surrounding structures were acquired without and with intravenous contrast. Angiographic images of the Circle of Willis were acquired using MRA technique without intravenous contrast. CONTRAST:  23mL GADAVIST GADOBUTROL 1 MMOL/ML IV SOLN COMPARISON:  Same-day noncontrast CT head FINDINGS: MRI HEAD FINDINGS Brain: There is a 2.4 cm TV by 1.9 cm AP by 2.0 cm cc cystic and solid enhancing lesion in the left occipital lobe consistent with metastatic disease. There is significant vasogenic edema surrounding the lesion resulting in partial effacement of the left lateral ventricle and effacement of the surrounding sulci. There is 3-4 mm rightward midline shift. No other metastatic lesions are identified. There is cortically based diffusion restriction in the left parietal lobe with associated T2/FLAIR signal abnormality. There is no evidence of  acute intracranial hemorrhage or extra-axial fluid collection. Vascular: See below Skull and upper cervical spine: There is a small focus of enhancement in the left parietal bone corresponding to the lucent lesion seen on the prior CT concerning for osseous metastasis. No other osseous metastatic lesions are seen. Sinuses/Orbits: The paranasal sinuses are clear. The globes and orbits are unremarkable. Other: None. MRA HEAD FINDINGS Anterior circulation: The bilateral intracranial ICAs are patent. The bilateral MCAs and ACAs are patent. There is no significant stenosis or occlusion. There is no aneurysm. Posterior circulation: The bilateral V4 segments of the vertebral arteries are patent. The bilateral PCAs are patent. There is no significant stenosis or occlusion. There is no aneurysm. There is a prominent left posterior communicating artery. The right posterior communicating artery is not definitively identified. Anatomic variants: As above. IMPRESSION: 1. 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 2. Subcentimeter focus of enhancement in the left parietal bone concerning for osseous metastasis. 3. Cortically based diffusion restriction in the left parietal lobe consistent with early subacute  ischemia which may be due to mass effect from the above described metastatic lesion. 4. Patent intracranial vasculature. Electronically Signed   By: Valetta Mole M.D.   On: 09/07/2021 13:18   MR Brain W and Wo Contrast  Result Date: 09/07/2021 CLINICAL DATA:  Metastatic disease evaluation EXAM: MRI HEAD WITHOUT AND WITH CONTRAST MRA HEAD WITHOUT CONTRAST TECHNIQUE: Multiplanar, multi-echo pulse sequences of the brain and surrounding structures were acquired without and with intravenous contrast. Angiographic images of the Circle of Willis were  acquired using MRA technique without intravenous contrast. CONTRAST:  78mL GADAVIST GADOBUTROL 1 MMOL/ML IV SOLN COMPARISON:  Same-day noncontrast CT head FINDINGS: MRI HEAD FINDINGS Brain: There is a 2.4 cm TV by 1.9 cm AP by 2.0 cm cc cystic and solid enhancing lesion in the left occipital lobe consistent with metastatic disease. There is significant vasogenic edema surrounding the lesion resulting in partial effacement of the left lateral ventricle and effacement of the surrounding sulci. There is 3-4 mm rightward midline shift. No other metastatic lesions are identified. There is cortically based diffusion restriction in the left parietal lobe with associated T2/FLAIR signal abnormality. There is no evidence of acute intracranial hemorrhage or extra-axial fluid collection. Vascular: See below Skull and upper cervical spine: There is a small focus of enhancement in the left parietal bone corresponding to the lucent lesion seen on the prior CT concerning for osseous metastasis. No other osseous metastatic lesions are seen. Sinuses/Orbits: The paranasal sinuses are clear. The globes and orbits are unremarkable. Other: None. MRA HEAD FINDINGS Anterior circulation: The bilateral intracranial ICAs are patent. The bilateral MCAs and ACAs are patent. There is no significant stenosis or occlusion. There is no aneurysm. Posterior circulation: The bilateral V4 segments of the vertebral arteries are patent. The bilateral PCAs are patent. There is no significant stenosis or occlusion. There is no aneurysm. There is a prominent left posterior communicating artery. The right posterior communicating artery is not definitively identified. Anatomic variants: As above. IMPRESSION: 1. 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 2. Subcentimeter focus of  enhancement in the left parietal bone concerning for osseous metastasis. 3. Cortically based diffusion restriction in the left parietal lobe consistent with early subacute ischemia which may be due to mass effect from the above described metastatic lesion. 4. Patent intracranial vasculature. Electronically Signed   By: Valetta Mole M.D.   On: 09/07/2021 13:18   CT CHEST ABDOMEN PELVIS W CONTRAST  Result Date: 09/07/2021 CLINICAL DATA:  Brain metastasis.  Lung mass EXAM: CT CHEST, ABDOMEN, AND PELVIS WITH CONTRAST TECHNIQUE: Multidetector CT imaging of the chest, abdomen and pelvis was performed following the standard protocol during bolus administration of intravenous contrast. CONTRAST:  28mL OMNIPAQUE IOHEXOL 300 MG/ML  SOLN COMPARISON:  Radiograph 09/07/2021 FINDINGS: CT CHEST FINDINGS Cardiovascular: No significant vascular findings. Normal heart size. No pericardial effusion. Mediastinum/Nodes: No axillary or supraclavicular adenopathy. No mediastinal or hilar adenopathy. No pericardial fluid. Esophagus normal. Lungs/Pleura: LEFT lower lobe mass measures 5.1 by 3.0 x 4.2 cm. Mass abuts the posterior margin of the LEFT oblique fissure. Musculoskeletal: No aggressive osseous lesion. Postsurgical change in the lateral RIGHT breast with soft tissue thickening and surgical clips on image 25/2. CT ABDOMEN AND PELVIS FINDINGS Hepatobiliary: No focal hepatic lesion. Postcholecystectomy. No biliary dilatation. Subtle 4 mm hypodensity in the lateral LEFT hepatic lobe (image 46/2)  is favored benign. Pancreas: Pancreas is normal. No ductal dilatation. No pancreatic inflammation. Spleen: Normal spleen Adrenals/urinary tract: Adrenal glands normal. Bilateral nephrolithiasis. Ureters and bladder normal Stomach/Bowel: Stomach, small bowel, appendix, and cecum are normal. The colon and rectosigmoid colon are normal. Vascular/Lymphatic: Abdominal aorta is normal caliber. There is no retroperitoneal or periportal  lymphadenopathy. No pelvic lymphadenopathy. Reproductive: Post hysterectomy.  Adnexa unremarkable Other: No free fluid. Musculoskeletal: No aggressive osseous lesion. IMPRESSION: Chest Impression: 1. 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 2. No metastatic adenopathy. 3. Postsurgical change along the lateral aspect of the RIGHT breast. Abdomen / Pelvis Impression: 1. No metastatic disease in the abdomen pelvis. 2. No skeletal metastasis Electronically Signed   By: Suzy Bouchard M.D.   On: 09/07/2021 14:33   DG Chest Portable 1 View  Result Date: 09/07/2021 CLINICAL DATA:  Altered mental status EXAM: PORTABLE CHEST 1 VIEW COMPARISON:  01/20/2016 FINDINGS: Large left midlung/perihilar mass is new since prior study measuring up to 5 cm. Right lung is clear. No effusions. Heart is normal size. Interval removal of left Port-A-Cath. IMPRESSION: Left mid lung/perihilar 5 cm mass. This could be further evaluated with chest CT with IV contrast. Electronically Signed   By: Rolm Baptise M.D.   On: 09/07/2021 10:19        Scheduled Meds:  benztropine  1 mg Oral Daily   carvedilol  3.125 mg Oral BID   Chlorhexidine Gluconate Cloth  6 each Topical Daily   cromolyn  1 drop Both Eyes QID   cycloSPORINE  1 drop Both Eyes BID   dexamethasone (DECADRON) injection  4 mg Intravenous Q6H   enoxaparin (LOVENOX) injection  40 mg Subcutaneous Q24H   insulin aspart  0-15 Units Subcutaneous TID WC   insulin aspart  0-5 Units Subcutaneous QHS   OLANZapine  15 mg Oral QHS   pantoprazole  40 mg Oral Daily   simvastatin  10 mg Oral QHS   Continuous Infusions:  0.9 % NaCl with KCl 20 mEq / L 75 mL/hr at 09/08/21 0912     LOS: 1 day    Time spent: 39 minutes spent on chart review, discussion with nursing staff, consultants, updating family and interview/physical exam; more than 50% of that time was spent in counseling and/or coordination of care.    Garritt Molyneux J [place] DO [facility] Available via Epic secure chat 7am-7pm After these hours, please refer to coverage provider listed on [email or web address] 09/08/2021, 11:37 AM

## 2021-09-08 NOTE — Consult Note (Signed)
Chief Complaint   Chief Complaint  Patient presents with   Altered Mental Status    History of Present Illness  Alison [name] is a 60 y.o. female seen in inpatient consultation after she was admitted through the emergency department.  She presented from a group home with confusion and lethargy.  Patient does have a history of schizophrenia as well as more remote history of breast cancer.  She is seen this afternoon with her mother at bedside.  Mother notes that patient is somewhat improved although remains confused and not quite at baseline.  Past Medical History   Past Medical History:  Diagnosis Date   Anemia    Cancer ([place])    Depression    chronic    Diabetes mellitus without complication ([place])    type II   Gallstones    GERD (gastroesophageal reflux disease)    History of radiation therapy 07/25/16- 09/05/16   Right Breast   Hyperlipidemia    Hypertension    Personal history of chemotherapy    Personal history of radiation therapy    Schizophrenia [facility])     Past Surgical History   Past Surgical History:  Procedure Laterality Date   ABDOMINAL HYSTERECTOMY     BREAST LUMPECTOMY Right 2017   BREAST LUMPECTOMY WITH RADIOACTIVE SEED AND SENTINEL LYMPH NODE BIOPSY Right 01/20/2016   Procedure: RIGHT BREAST LUMPECTOMY WITH RADIOACTIVE SEED AND SENTINEL LYMPH NODE BIOPSY;  Surgeon: Coralie Keens, MD;  Location: [facility];  Service: General;  Laterality: Right;   CHOLECYSTECTOMY N/A 04/16/2014   Procedure: LAPAROSCOPIC CHOLECYSTECTOMY ;  Surgeon: [name]. Georgette Dover, MD;  Location: [facility];  Service: General;  Laterality: N/A;   DEBULKING N/A 06/01/2017   Procedure: POSSIBLE DEBULKING;  Surgeon: Janie Morning, MD;  Location: [facility];  Service: Gynecology;  Laterality: N/A;   IR RADIOLOGIST EVAL & MGMT  06/27/2017   PORT-A-CATH REMOVAL Left 10/13/2016   Procedure: REMOVAL PORT-A-CATH;  Surgeon: Coralie Keens, MD;  Location: [facility];  Service: General;  Laterality:  Left;   PORTACATH PLACEMENT Left 01/20/2016   Procedure: INSERTION PORT-A-CATH;  Surgeon: Coralie Keens, MD;  Location: [facility];  Service: General;  Laterality: Left;   ROBOTIC ASSISTED BILATERAL SALPINGO OOPHERECTOMY N/A 06/01/2017   Procedure: XI ROBOTIC ASSISTED BILATERAL SALPINGO OOPHORECTOMY AND PELVIC WASHINGS;  Surgeon: Janie Morning, MD;  Location: [facility];  Service: Gynecology;  Laterality: N/A;    Social History   Social History   Tobacco Use   Smoking status: Former    Packs/day: 0.25    Types: Cigarettes    Quit date: 12/2015    Years since quitting: 5.7   Smokeless tobacco: Never  Vaping Use   Vaping Use: Never used  Substance Use Topics   Alcohol use: Yes    Comment: "only on special occasions"    Drug use: No    Medications   Prior to Admission medications   Medication Sig Start Date End Date Taking? Authorizing Provider  atorvastatin (LIPITOR) 20 MG tablet Take 20 mg by mouth daily. 08/12/21  Yes [provider]  benztropine (COGENTIN) 1 MG tablet Take 1 tablet (1 mg total) by mouth daily. 08/11/21  Yes Eulis Canner E, NP  carvedilol (COREG) 3.125 MG tablet TAKE 1 TABLET BY MOUTH TWICE DAILY. 08/17/16  Yes Larey Dresser, MD  Cholecalciferol (VITAMIN D3 ULTRA POTENCY) 1.25 MG (50000 UT) TABS Take 1 tablet by mouth daily.   Yes [provider]  cromolyn (OPTICROM) 4 %  ophthalmic solution Place 1 drop into both eyes 4 (four) times daily.   Yes [provider]  cycloSPORINE (RESTASIS) 0.05 % ophthalmic emulsion Place 1 drop into both eyes 2 (two) times daily.   Yes [provider]  furosemide (LASIX) 40 MG tablet Take 40 mg by mouth every morning.   Yes [provider]  lisinopril (PRINIVIL,ZESTRIL) 10 MG tablet Take 10 mg by mouth every morning.    Yes [provider]  metFORMIN (GLUCOPHAGE) 500 MG tablet Take 500 mg by mouth 2 (two) times daily with a meal.    Yes [provider]   OLANZapine (ZYPREXA) 15 MG tablet TAKE 1 TABLET BY MOUTH AT BEDTIME FOR MOOD 08/11/21  Yes Eulis Canner E, NP  omeprazole (PRILOSEC) 20 MG capsule Take 40 mg by mouth every morning.   Yes [provider]  simvastatin (ZOCOR) 10 MG tablet Take 10 mg by mouth at bedtime. Patient not taking: Reported on 09/07/2021    [provider]    Allergies  No Known Allergies  Review of Systems  ROS  Neurologic Exam  Awake, alert, oriented Memory and concentration grossly intact Speech fluent, appropriate CN grossly intact Motor exam: Upper Extremities Deltoid Bicep Tricep Grip  Right 5/5 5/5 5/5 5/5  Left 5/5 5/5 5/5 5/5   Lower Extremities IP Quad PF DF EHL  Right 5/5 5/5 5/5 5/5 5/5  Left 5/5 5/5 5/5 5/5 5/5   Sensation grossly intact to LT  Imaging  MRI of the brain was personally reviewed and demonstrates a peripherally enhancing lesion within the left occipital pole measuring approximately 2.4 cm in maximal dimension.  There is surrounding vasogenic edema with mild effacement of the occipital horn.  There is no hydrocephalus.  Radiologist interpretation of CT chest abdomen pelvis was also reviewed and demonstrates a relatively large left lower lobe mass consistent with primary bronchogenic carcinoma.  Impression  - 60 y.o. female with remote history of breast cancer, admitted with confusion and lethargy.  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  Systemic imaging demonstrates a large likely new primary lung cancer.  I have spoken with Dr. Isidore Moos, radiation oncology, regarding this case and we both feel that this lesion, almost certainly metastatic regardless of whether breast or lung primary, can be treated with stereotactic radiosurgery.  Furthermore, I think this would be a reasonable treatment even from a palliative standpoint as it is relatively low risk with minimal associated discomfort, and has a good chance of preventing neurologic sequela from  growth of this brain metastasis.  Plan  -Patient's mother would like some time to review her options before deciding whether or not to proceed with stereotactic radiosurgery to this solitary left occipital metastasis.  I have reviewed the situation with the patient and her mother who is at bedside.  I discussed with them the likely diagnosis of metastatic lesion, somewhat unclear whether this is a lung or breast primary.  Nonetheless I did tell them that the ideal treatment for this particular lesion is single fraction stereotactic radiosurgery.  I did tell them that I think this would be a reasonable option even if they are considering a more palliative treatment approach.  All their questions today were answered.  Consuella Lose, MD [facility]

## 2021-09-08 NOTE — Plan of Care (Signed)
  Problem: Education: Goal: Knowledge of secondary prevention will improve Outcome: Progressing   Problem: Health Behavior/Discharge Planning: Goal: Ability to manage health-related needs will improve Outcome: Progressing   Problem: Clinical Measurements: Goal: Ability to maintain clinical measurements within normal limits will improve Outcome: Progressing Goal: Will remain free from infection Outcome: Progressing Goal: Diagnostic test results will improve Outcome: Progressing Goal: Respiratory complications will improve Outcome: Progressing Goal: Cardiovascular complication will be avoided Outcome: Progressing

## 2021-09-08 NOTE — Consult Note (Signed)
Consultation Note Date: 09/08/2021   Patient Name: Alison [name]  DOB: Nov 13, 1961  MRN: [id number]  Age / Sex: 60 y.o., female  PCP: Benito Mccreedy, MD Referring Physician: [place] Eric J, DO  Reason for Consultation: Establishing goals of care "61 yo female admitted to [place] and found to ave lung/brain mass. Please assist with GOC and MDM"  HPI/Patient Profile: 60 y.o. female  with past medical history of schizophrenia, tardive dyskinesia, diabetes mellitus type 2, right-sided breast cancer, hypertension, and hyperlipidemia. She presented to the [facility] emergency department on 09/07/2021 with altered mental status. In the ED, patient was hemodynamically stable. Initial CT head showed edema in the left parietal lobe concerning for metastatic disease. 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 CT abdomen and pelvis did not reveal any signs of metastasis but did show a large LLL mass (concerning for primary lung cancer). EDP discussed with neurosurgery and patient was subsequently started on IV dexamethasone and transferred to [facility].   Clinical Assessment and Goals of Care: I have reviewed medical records including EPIC notes, labs and imaging, and went to see the patient at bedside. She is alert and oriented to self only. Her mother Alison [name] is at bedside. Patient does not recognize her mother. Alison [name] shares that patient goes by her middle name "Alison [name]".    Alison [name] and I went to the 3W waiting room to discuss diagnosis, prognosis, GOC, EOL wishes, disposition, and options. I introduced Palliative Medicine as specialized medical care for people living with serious illness. It focuses on providing relief from the symptoms and stress of a serious illness.   We discussed a brief life review of the patient. Alison [name]  describes her daughter as "hard-headed". She was married briefly at one time. She had 5 children but was unable to care for them. Alison [name] raised the oldest child; the other 4 children were all adopted.   Due to her severe mental illness and other health problems, patient has lived in a group home for the past 25 years.   Alison [name] currently resides at [facility] group home in [place].  As far as functional and nutritional status, there has been a decline over the past few weeks. Alison [name] visits Alison [name] monthly and during her last visit, she noticed "something wasn't right". Alison [name] seemed increasingly weak and confused.   We discussed patient's current illness and what it means in the larger context of her ongoing co-morbidities. Alison [name] states "the doctor told me everything". Natural disease trajectory of advanced cancer was discussed. Alison [name] verbalizes understanding that Alison [name] has metastatic cancer and that this is a terminal diagnosis.   The difference between full scope medical intervention and comfort care was considered.  I introduced the concept of a comfort path, emphasizing that this path involves stopping full scope medical interventions, allowing a natural course to occur. Discussed that the goal is comfort and dignity rather than cure/prolonging life. Alison [name] does not want Alison [name] to have to endure  further work-up or treatment. She wants to make her comfortable and "let her go". She has spoken with her pastor, and feels at peace with this decision.   Discussed transitioning to comfort care while in house, and what that would look like--keeping her clean and dry, no labs, no artificial hydration or feeding, no antibiotics, minimizing of medications, comfort feeds, medication for pain and dyspnea.   We discussed code status. Encouraged patient to consider DNR/DNI status understanding evidenced based poor outcomes in similar hospitalized patients, as the cause of the arrest is likely associated  with chronic/terminal disease rather than a reversible acute cardio-pulmonary event. I explained that DNR/DNI   is a protective measure to keep [place] from harming the patient in their last moments of life. Alison [name] is fully agreeable to DNR/DNI status.    Introduced hospice philosophy and provided information on home vs residential hospice services. Alison [name] would like patient transferred to [facility] if eligible.    Primary decision maker: Alison [name] (mother)    SUMMARY OF RECOMMENDATIONS   Code status changed to DNR/DNI Initiated full comfort measures Transfer to [facility] pending confirmation of eligibility and bed availability PRN medications are available for symptom management at EOL Continue IV dexamethasone for now   Symptom Management:  Morphine prn for pain or dyspnea Lorazepam (ATIVAN) prn for anxiety Haloperidol (HALDOL) prn for agitation  Glycopyrrolate (ROBINUL) for excessive secretions Ondansetron (ZOFRAN) prn for nausea Polyvinyl alcohol (LIQUIFILM TEARS) prn for dry eyes Antiseptic oral rinse (BIOTENE) prn for dry mouth  Code Status/Advance Care Planning: DNR   Palliative Prophylaxis:  Oral Care and Turn Reposition  Additional Recommendations (Limitations, Scope, Preferences): Full Comfort Care  Psycho-social/Spiritual:  Created space and opportunity for family to express thoughts and feelings regarding patient's current medical situation.  Emotional support provided   Prognosis:  < 2 weeks   Discharge Planning: To Be Determined      Primary Diagnoses: Present on Admission:  Tardive dyskinesia  Schizophrenia in remission [facility])  Breast cancer of upper-outer quadrant of right female breast ([place])  Pressure injury of skin  Lung mass   I have reviewed the medical record, interviewed the patient and family, and examined the patient. The following aspects are pertinent.  Past Medical History:  Diagnosis Date   Anemia    Cancer ([place])     Depression    chronic    Diabetes mellitus without complication ([place])    type II   Gallstones    GERD (gastroesophageal reflux disease)    History of radiation therapy 07/25/16- 09/05/16   Right Breast   Hyperlipidemia    Hypertension    Personal history of chemotherapy    Personal history of radiation therapy    Schizophrenia ([place])      Family History  Problem Relation Age of Onset   Breast cancer Mother 54       s/p partial mastectomy   Skin cancer Mother        on right knee; unspecified type   Colon polyps Mother        three total   Other Mother 72       history of a TAH-BSO for bleeding   Prostate cancer Maternal Uncle        (x2 maternal uncles)   Cirrhosis Maternal Grandfather        +EtOH   Cancer Brother 3       vocal cord ca; not a smoker   Autism Brother    Cancer Maternal  Uncle        vocal cord ca; +smoker   Ovarian cancer Maternal Aunt        dx. >50   Cancer Maternal Aunt        unspecified type   Diabetes Maternal Aunt    Colon cancer Cousin        (x2 maternal first cousins--sisters)   Breast cancer Cousin        dx. 4 or younger   Cancer Other        NOS cancer; lim info; all three of his daughters had cancer as well   Breast cancer Other    Scheduled Meds:  benztropine  1 mg Oral Daily   carvedilol  3.125 mg Oral BID   cromolyn  1 drop Both Eyes QID   cycloSPORINE  1 drop Both Eyes BID   dexamethasone (DECADRON) injection  4 mg Intravenous Q6H   OLANZapine  15 mg Oral QHS   pantoprazole  40 mg Oral Daily   Continuous Infusions: PRN Meds:.acetaminophen **OR** acetaminophen, antiseptic oral rinse, glycopyrrolate **OR** glycopyrrolate **OR** glycopyrrolate, haloperidol **OR** haloperidol **OR** haloperidol lactate, LORazepam **OR** LORazepam **OR** LORazepam, morphine injection, ondansetron **OR** ondansetron (ZOFRAN) IV, polyvinyl alcohol   No Known Allergies Review of Systems  Unable to perform ROS: Other   Physical  Exam Constitutional:      General: She is not in acute distress.    Appearance: She is [name]-appearing.  Pulmonary:     Effort: Pulmonary effort is normal.  Neurological:     Mental Status: She is alert.     Motor: Weakness present.     Comments: Oriented to self only  Psychiatric:        Mood and Affect: Affect is flat.        Cognition and Memory: Cognition is impaired.    Vital Signs: BP (!) 116/59 (BP Location: Left Arm)   Pulse 99   Temp 98.3 F (36.8 C) (Oral)   Resp 16   Wt 60.9 kg   SpO2 98%   BMI 24.96 kg/m  Pain Scale: 0-10   Pain Score: 0-No pain   SpO2: SpO2: 98 % O2 Device:SpO2: 98 % O2 Flow Rate: .   IO: Intake/output summary:  Intake/Output Summary (Last 24 hours) at 09/08/2021 1522 Last data filed at 09/08/2021 1202 Gross per 24 hour  Intake 360 ml  Output 750 ml  Net -390 ml    LBM: Last BM Date:  (PTA) Baseline Weight: Weight: 60.9 kg Most recent weight: Weight: 60.9 kg      Palliative Assessment/Data: PPS 20%     Time In: 1415 Time Out: 1530 Time Total: 75 minutes Greater than 50%  of this time was spent counseling and coordinating care related to the above assessment and plan.  Signed by: Lavena Bullion, NP   Please contact Palliative Medicine Team phone at [phone] for questions and concerns.  For individual provider: See Shea Evans

## 2021-09-09 DIAGNOSIS — G2401 Drug induced subacute dyskinesia: Secondary | ICD-10-CM | POA: Diagnosis not present

## 2021-09-09 DIAGNOSIS — F209 Schizophrenia, unspecified: Secondary | ICD-10-CM | POA: Diagnosis not present

## 2021-09-09 DIAGNOSIS — R569 Unspecified convulsions: Secondary | ICD-10-CM

## 2021-09-09 DIAGNOSIS — R918 Other nonspecific abnormal finding of lung field: Secondary | ICD-10-CM | POA: Diagnosis not present

## 2021-09-09 DIAGNOSIS — G9389 Other specified disorders of brain: Secondary | ICD-10-CM | POA: Diagnosis not present

## 2021-09-09 MED ORDER — LORAZEPAM 2 MG/ML IJ SOLN
1.0000 mg | Freq: Four times a day (QID) | INTRAMUSCULAR | Status: DC
  Administered 2021-09-09 (×2): 1 mg via INTRAVENOUS
  Filled 2021-09-09 (×2): qty 1

## 2021-09-09 MED ORDER — LORAZEPAM 2 MG/ML IJ SOLN: 1.0000 mg | INTRAMUSCULAR | Status: DC | PRN

## 2021-09-09 MED ORDER — LEVETIRACETAM IN NACL 500 MG/100ML IV SOLN
500.0000 mg | Freq: Two times a day (BID) | INTRAVENOUS | Status: DC
  Administered 2021-09-09: 500 mg via INTRAVENOUS
  Filled 2021-09-09: qty 100

## 2021-09-09 MED ORDER — LORAZEPAM 2 MG/ML PO CONC: 1.0000 mg | ORAL | Status: DC | PRN

## 2021-09-09 MED ORDER — LORAZEPAM 1 MG PO TABS: 1.0000 mg | ORAL_TABLET | ORAL | Status: DC | PRN

## 2021-09-09 NOTE — Discharge Summary (Signed)
Physician Discharge Summary  Alison [name] JQZ:[id number] DOB: 18-Sep-1961 DOA: 09/07/2021  PCP: Benito Mccreedy, MD  Admit date: 09/07/2021 Discharge date: 09/09/2021  Admitted From: Group home Disposition: [place] residential hosp[facility]  Recommendations for Outpatient Follow-up:  Follow up with hospice provider  Discharge Condition: Poor/grim CODE STATUS: DNR Diet recommendation: Comfort feeds as tolerates  History of present illness:  Alison [name] is a 60 year old female with past medical history significant for schizophrenia, tardive dyskinesia, type 2 diabetes mellitus, Hx right breast cancer, essential hypertension, hyperlipidemia, vitamin D deficiency who presented initially to [facility] with altered mental status on 9/27.  Due to her mental status, history obtained from chart review, medical records and speaking with EDP.  Patient has had increasing lethargy, sleepiness and decreased oral intake.  At baseline patient able to ambulate, however patient has not been speaking as much.  No reported fevers, no nausea/vomiting, no chest pain or shortness of breath.   In the ED, patient was afebrile with SPO2 100% on room air.  Sodium 139, potassium 3.8, creatinine 1.48, AST 23, ALT 17, alk phos 90, total bilirubin 0.6.  WBC 7.5, hemoglobin 10.9, platelets 214.  EKG with normal sinus rhythm.  Ammonia level less than 9.  Lactic acid 1.3.  Covid-19/flu A/B PCR negative.  CT head with edema left parietal lobe concerning for metastatic disease.  MR/MRA brain with single intracranial metastases measuring up to 2.4 centimeters on the left occipital lobe with significant surrounding vasogenic edema resulting in partial effacement of the left lateral ventricle and 3-4 mm rightward midline shift; subcentimeter focus enhancement left parietal bone concerning for osseous metastasis, diffusion restriction left parietal lobe consistent with early subacute ischemia  versus mass-effect and patent intracranial vasculature.  EDP discussed case with neurosurgery, Dr. Kathyrn Sheriff who recommended IV dexamethasone and transferred to [facility] for further evaluation.  Hospital course:  Acute metabolic encephalopathy, POA Left lung mass concerning for bronchogenic carcinoma Brain mass with vasogenic edema and midline shift Patient presenting to [facility] ED with altered mental status, confusion and lethargy.  Brain imaging studies significant for single intracranial metastasis 2.4 cm left occipital lobe with surrounding vasogenic edema resulting in partial effacement of the left lateral ventricle and midline shift.  CT chest/abdomen/pelvis with large left lower lobe mass measuring 5.1 x 3.0 x 4.2 cm.  Neurosurgery was consulted and recommended transfer to [facility] for further evaluation.  Case was discussed with patient's mother who wished to transition her care to a more comfort approach.  Palliative care was consulted and followed during the remainder of the hospital course.  Neurosurgery Dr. Kathyrn Sheriff did offer stereotactic radiosurgery, but patient's mother declined and did not want any further aggressive care.  Patient will be transferring to [facility] residential hosp[facility].   Essential hypertension Discontinue antihypertensives now transitioning to hospice care.   T2DM Discontinued home metformin as now transitioning to hospice care   Schizophrenia Tardive dyskinesia Discontinued home Zyprexa and benztropine as transitioning to hospice care   HLD: Discontinued simvastatin   GERD: Discontinue Protonix   Hx Breast Cancer Patient with history of breast cancer, currently in remission.  S/p lumpectomy with adjuvant chemoradiation.  Recent mammogram 03/25/2021 with no mammographic evidence of malignancy bilaterally.  Follows with medical oncology outpatient, Dr. Sonny Dandy.  Discharge Diagnoses:  Active Problems:   Breast cancer of upper-outer quadrant  of right female breast ([name])   Tardive dyskinesia   Schizophrenia in remission ([place])   Brain mass  Lung mass   Pressure injury of skin    Discharge Instructions  Discharge Instructions     Diet - low sodium heart healthy   Complete by: As directed    Increase activity slowly   Complete by: As directed    No wound care   Complete by: As directed       Allergies as of 09/09/2021   No Known Allergies      Medication List     STOP taking these medications    atorvastatin 20 MG tablet Commonly known as: LIPITOR   benztropine 1 MG tablet Commonly known as: COGENTIN   carvedilol 3.125 MG tablet Commonly known as: COREG   cromolyn 4 % ophthalmic solution Commonly known as: OPTICROM   cycloSPORINE 0.05 % ophthalmic emulsion Commonly known as: RESTASIS   furosemide 40 MG tablet Commonly known as: LASIX   lisinopril 10 MG tablet Commonly known as: ZESTRIL   metFORMIN 500 MG tablet Commonly known as: GLUCOPHAGE   OLANZapine 15 MG tablet Commonly known as: ZYPREXA   omeprazole 20 MG capsule Commonly known as: PRILOSEC   simvastatin 10 MG tablet Commonly known as: ZOCOR   Vitamin D3 Ultra Potency 1.25 MG (50000 UT) Tabs Generic drug: Cholecalciferol        No Known Allergies  Consultations: Neurosurgery, Dr. Kathyrn Sheriff Palliative care   Procedures/Studies: CT HEAD WO CONTRAST (5MM)  Result Date: 09/07/2021 CLINICAL DATA:  Altered mental status EXAM: CT HEAD WITHOUT CONTRAST TECHNIQUE: Contiguous axial images were obtained from the base of the skull through the vertex without intravenous contrast. COMPARISON:  None. FINDINGS: Brain: There is hypodensity in the left parietal lobe with sparing of the overlying cortex. The remainder of the brain parenchyma is normal in appearance. There is no evidence of acute intracranial hemorrhage. There is no large vessel territorial infarct. There is partial effacement of the left lateral ventricle due to the  above-described edema. There is no midline shift. Vascular: No hyperdense vessel or unexpected calcification. Skull: There is a subcentimeter lucent lesion in the left parietal calvarium (3-38). Sinuses/Orbits: The imaged paranasal sinuses are clear. The globes and orbits are unremarkable. Other: None. IMPRESSION: 1. Edema in the left parietal lobe raises concern for metastatic disease given the findings on the same day chest radiograph. Recommend brain MRI with and without contrast for further evaluation. 2. Subcentimeter lucent lesion in the left parietal calvarium is suspicious for osseous metastatic disease. This can also be further assessed with MRI. Electronically Signed   By: Valetta Mole M.D.   On: 09/07/2021 10:34   MR ANGIO HEAD WO CONTRAST  Result Date: 09/07/2021 CLINICAL DATA:  Metastatic disease evaluation EXAM: MRI HEAD WITHOUT AND WITH CONTRAST MRA HEAD WITHOUT CONTRAST TECHNIQUE: Multiplanar, multi-echo pulse sequences of the brain and surrounding structures were acquired without and with intravenous contrast. Angiographic images of the Circle of Willis were acquired using MRA technique without intravenous contrast. CONTRAST:  31mL GADAVIST GADOBUTROL 1 MMOL/ML IV SOLN COMPARISON:  Same-day noncontrast CT head FINDINGS: MRI HEAD FINDINGS Brain: There is a 2.4 cm TV by 1.9 cm AP by 2.0 cm cc cystic and solid enhancing lesion in the left occipital lobe consistent with metastatic disease. There is significant vasogenic edema surrounding the lesion resulting in partial effacement of the left lateral ventricle and effacement of the surrounding sulci. There is 3-4 mm rightward midline shift. No other metastatic lesions are identified. There is cortically based diffusion restriction in the left parietal lobe with associated T2/FLAIR signal abnormality.  There is no evidence of acute intracranial hemorrhage or extra-axial fluid collection. Vascular: See below Skull and upper cervical spine: There is a  small focus of enhancement in the left parietal bone corresponding to the lucent lesion seen on the prior CT concerning for osseous metastasis. No other osseous metastatic lesions are seen. Sinuses/Orbits: The paranasal sinuses are clear. The globes and orbits are unremarkable. Other: None. MRA HEAD FINDINGS Anterior circulation: The bilateral intracranial ICAs are patent. The bilateral MCAs and ACAs are patent. There is no significant stenosis or occlusion. There is no aneurysm. Posterior circulation: The bilateral V4 segments of the vertebral arteries are patent. The bilateral PCAs are patent. There is no significant stenosis or occlusion. There is no aneurysm. There is a prominent left posterior communicating artery. The right posterior communicating artery is not definitively identified. Anatomic variants: As above. IMPRESSION: 1. 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 2. Subcentimeter focus of enhancement in the left parietal bone concerning for osseous metastasis. 3. Cortically based diffusion restriction in the left parietal lobe consistent with early subacute ischemia which may be due to mass effect from the above described metastatic lesion. 4. Patent intracranial vasculature. Electronically Signed   By: Valetta Mole M.D.   On: 09/07/2021 13:18   MR Brain W and Wo Contrast  Result Date: 09/07/2021 CLINICAL DATA:  Metastatic disease evaluation EXAM: MRI HEAD WITHOUT AND WITH CONTRAST MRA HEAD WITHOUT CONTRAST TECHNIQUE: Multiplanar, multi-echo pulse sequences of the brain and surrounding structures were acquired without and with intravenous contrast. Angiographic images of the Circle of Willis were acquired using MRA technique without intravenous contrast. CONTRAST:  89mL GADAVIST GADOBUTROL 1 MMOL/ML IV SOLN COMPARISON:   Same-day noncontrast CT head FINDINGS: MRI HEAD FINDINGS Brain: There is a 2.4 cm TV by 1.9 cm AP by 2.0 cm cc cystic and solid enhancing lesion in the left occipital lobe consistent with metastatic disease. There is significant vasogenic edema surrounding the lesion resulting in partial effacement of the left lateral ventricle and effacement of the surrounding sulci. There is 3-4 mm rightward midline shift. No other metastatic lesions are identified. There is cortically based diffusion restriction in the left parietal lobe with associated T2/FLAIR signal abnormality. There is no evidence of acute intracranial hemorrhage or extra-axial fluid collection. Vascular: See below Skull and upper cervical spine: There is a small focus of enhancement in the left parietal bone corresponding to the lucent lesion seen on the prior CT concerning for osseous metastasis. No other osseous metastatic lesions are seen. Sinuses/Orbits: The paranasal sinuses are clear. The globes and orbits are unremarkable. Other: None. MRA HEAD FINDINGS Anterior circulation: The bilateral intracranial ICAs are patent. The bilateral MCAs and ACAs are patent. There is no significant stenosis or occlusion. There is no aneurysm. Posterior circulation: The bilateral V4 segments of the vertebral arteries are patent. The bilateral PCAs are patent. There is no significant stenosis or occlusion. There is no aneurysm. There is a prominent left posterior communicating artery. The right posterior communicating artery is not definitively identified. Anatomic variants: As above. IMPRESSION: 1. 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 2. Subcentimeter focus of enhancement in the left parietal bone concerning for osseous metastasis. 3. Cortically  based diffusion restriction in the left  parietal lobe consistent with early subacute ischemia which may be due to mass effect from the above described metastatic lesion. 4. Patent intracranial vasculature. Electronically Signed   By: Valetta Mole M.D.   On: 09/07/2021 13:18   CT CHEST ABDOMEN PELVIS W CONTRAST  Result Date: 09/07/2021 CLINICAL DATA:  Brain metastasis.  Lung mass EXAM: CT CHEST, ABDOMEN, AND PELVIS WITH CONTRAST TECHNIQUE: Multidetector CT imaging of the chest, abdomen and pelvis was performed following the standard protocol during bolus administration of intravenous contrast. CONTRAST:  17mL OMNIPAQUE IOHEXOL 300 MG/ML  SOLN COMPARISON:  Radiograph 09/07/2021 FINDINGS: CT CHEST FINDINGS Cardiovascular: No significant vascular findings. Normal heart size. No pericardial effusion. Mediastinum/Nodes: No axillary or supraclavicular adenopathy. No mediastinal or hilar adenopathy. No pericardial fluid. Esophagus normal. Lungs/Pleura: LEFT lower lobe mass measures 5.1 by 3.0 x 4.2 cm. Mass abuts the posterior margin of the LEFT oblique fissure. Musculoskeletal: No aggressive osseous lesion. Postsurgical change in the lateral RIGHT breast with soft tissue thickening and surgical clips on image 25/2. CT ABDOMEN AND PELVIS FINDINGS Hepatobiliary: No focal hepatic lesion. Postcholecystectomy. No biliary dilatation. Subtle 4 mm hypodensity in the lateral LEFT hepatic lobe (image 46/2) is favored benign. Pancreas: Pancreas is normal. No ductal dilatation. No pancreatic inflammation. Spleen: Normal spleen Adrenals/urinary tract: Adrenal glands normal. Bilateral nephrolithiasis. Ureters and bladder normal Stomach/Bowel: Stomach, small bowel, appendix, and cecum are normal. The colon and rectosigmoid colon are normal. Vascular/Lymphatic: Abdominal aorta is normal caliber. There is no retroperitoneal or periportal lymphadenopathy. No pelvic lymphadenopathy. Reproductive: Post hysterectomy.  Adnexa unremarkable Other: No free fluid. Musculoskeletal: No  aggressive osseous lesion. IMPRESSION: Chest Impression: 1. 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 2. No metastatic adenopathy. 3. Postsurgical change along the lateral aspect of the RIGHT breast. Abdomen / Pelvis Impression: 1. No metastatic disease in the abdomen pelvis. 2. No skeletal metastasis Electronically Signed   By: Suzy Bouchard M.D.   On: 09/07/2021 14:33   DG Chest Portable 1 View  Result Date: 09/07/2021 CLINICAL DATA:  Altered mental status EXAM: PORTABLE CHEST 1 VIEW COMPARISON:  01/20/2016 FINDINGS: Large left midlung/perihilar mass is new since prior study measuring up to 5 cm. Right lung is clear. No effusions. Heart is normal size. Interval removal of left Port-A-Cath. IMPRESSION: Left mid lung/perihilar 5 cm mass. This could be further evaluated with chest CT with IV contrast. Electronically Signed   By: Rolm Baptise M.D.   On: 09/07/2021 10:19     Subjective: Patient seen examined at bedside, resting comfortably.  Nonresponsive to verbal command.  Group home caregiver present.  Of care also present in room.  Discussed her continued decline, seizures overnight and now transitioning to residential hospice.  Discharge Exam: Vitals:   09/09/21 0345 09/09/21 0718  BP: 114/64 129/85  Pulse: (!) 121 (!) 120  Resp: 19 20  Temp: 97.7 F (36.5 C) 97.8 F (36.6 C)  SpO2: 90% 92%   Vitals:   09/08/21 1041 09/08/21 1341 09/09/21 0345 09/09/21 0718  BP: 122/84 (!) 116/59 114/64 129/85  Pulse:  99 (!) 121 (!) 120  Resp:  $Remo'16 19 20  'tjFzR$ Temp:  98.3 F (36.8 C) 97.7 F (36.5 C) 97.8 F (36.6 C)  TempSrc:  Oral  Oral  SpO2:  98% 90% 92%  Weight:        General: NAD, nonresponsive to verbal command Cardiovascular: RRR, S1/S2 +, no rubs, no gallops Respiratory: CTA bilaterally, no wheezing, no  rhonchi, on room air Abdominal: Soft, NT, ND, bowel sounds + Extremities: no edema, no cyanosis    The results of significant diagnostics from this  hospitalization (including imaging, microbiology, ancillary and laboratory) are listed below for reference.     Microbiology: Recent Results (from the past 240 hour(s))  Resp Panel by RT-PCR (Flu A&B, Covid) Nasopharyngeal Swab     Status: None   Collection Time: 09/07/21 10:55 AM   Specimen: Nasopharyngeal Swab; Nasopharyngeal(NP) swabs in vial transport medium  Result Value Ref Range Status   SARS Coronavirus 2 by RT PCR NEGATIVE NEGATIVE Final    Comment: (NOTE) SARS-CoV-2 target nucleic acids are NOT DETECTED.  The SARS-CoV-2 RNA is generally detectable in upper respiratory specimens during the acute phase of infection. The lowest concentration of SARS-CoV-2 viral copies this assay can detect is 138 copies/mL. A negative result does not preclude SARS-Cov-2 infection and should not be used as the sole basis for treatment or other patient management decisions. A negative result may occur with  improper specimen collection/handling, submission of specimen other than nasopharyngeal swab, presence of viral mutation(s) within the areas targeted by this assay, and inadequate number of viral copies(<138 copies/mL). A negative result must be combined with clinical observations, patient history, and epidemiological information. The expected result is Negative.  Fact Sheet for Patients:  EntrepreneurPulse.com.au  Fact Sheet for Healthcare Providers:  IncredibleEmployment.be  This test is no t yet approved or cleared by the [place] FDA and  has been authorized for detection and/or diagnosis of SARS-CoV-2 by FDA under an Emergency Use Authorization (EUA). This EUA will remain  in effect (meaning this test can be used) for the duration of the COVID-19 declaration under Section 564(b)(1) of the Act, 21 U.S.C.section 360bbb-3(b)(1), unless the authorization is terminated  or revoked sooner.       Influenza A by PCR NEGATIVE NEGATIVE Final    Influenza B by PCR NEGATIVE NEGATIVE Final    Comment: (NOTE) The Xpert Xpress SARS-CoV-2/FLU/RSV plus assay is intended as an aid in the diagnosis of influenza from Nasopharyngeal swab specimens and should not be used as a sole basis for treatment. Nasal washings and aspirates are unacceptable for Xpert Xpress SARS-CoV-2/FLU/RSV testing.  Fact Sheet for Patients: EntrepreneurPulse.com.au  Fact Sheet for Healthcare Providers: IncredibleEmployment.be  This test is not yet approved or cleared by the [place] FDA and has been authorized for detection and/or diagnosis of SARS-CoV-2 by FDA under an Emergency Use Authorization (EUA). This EUA will remain in effect (meaning this test can be used) for the duration of the COVID-19 declaration under Section 564(b)(1) of the Act, 21 U.S.C. section 360bbb-3(b)(1), unless the authorization is terminated or revoked.  Performed at [facility], [address] [place], [place] [address]      Labs: BNP (last 3 results) No results for input(s): BNP in the last 8760 hours. Basic Metabolic Panel: Recent Labs  Lab 09/07/21 1207 09/08/21 0319  NA 139 136  K 3.8 4.1  CL 104 104  CO2 24 22  GLUCOSE 99 153*  BUN 30* 28*  CREATININE 1.48* 1.38*  CALCIUM 8.0* 7.7*   Liver Function Tests: Recent Labs  Lab 09/07/21 1207 09/08/21 0319  AST 23 23  ALT 17 18  ALKPHOS 90 80  BILITOT 0.6 0.7  PROT 7.1 6.5  ALBUMIN 3.8 3.3*   Recent Labs  Lab 09/07/21 1207  LIPASE 40   Recent Labs  Lab 09/07/21 1207  AMMONIA <9*   CBC: Recent Labs  Lab 09/07/21 1207 09/08/21 0319  WBC 7.5 6.0  NEUTROABS 5.1  --   HGB 10.9* 9.4*  HCT 33.0* 28.1*  MCV 87.8 84.4  PLT 314 270   Cardiac Enzymes: No results for input(s): CKTOTAL, CKMB, CKMBINDEX, TROPONINI in the last 168 hours. BNP: Invalid input(s): POCBNP CBG: Recent Labs  Lab 09/07/21 2310 09/08/21 0637 09/08/21 0809 09/08/21 1200  09/08/21 1534  GLUCAP 172* 178* 153* 221* 157*   D-Dimer No results for input(s): DDIMER in the last 72 hours. Hgb A1c Recent Labs    09/08/21 0319  HGBA1C 5.9*   Lipid Profile No results for input(s): CHOL, HDL, LDLCALC, TRIG, CHOLHDL, LDLDIRECT in the last 72 hours. Thyroid function studies Recent Labs    09/07/21 1207  TSH 0.525   Anemia work up No results for input(s): VITAMINB12, FOLATE, FERRITIN, TIBC, IRON, RETICCTPCT in the last 72 hours. Urinalysis    Component Value Date/Time   COLORURINE YELLOW 06/14/2017 1100   APPEARANCEUR CLEAR 06/14/2017 1100   LABSPEC 1.010 06/14/2017 1100   PHURINE 6.0 06/14/2017 1100   GLUCOSEU NEGATIVE 06/14/2017 1100   HGBUR NEGATIVE 06/14/2017 1100   BILIRUBINUR NEGATIVE 06/14/2017 1100   KETONESUR NEGATIVE 06/14/2017 1100   PROTEINUR NEGATIVE 06/14/2017 1100   UROBILINOGEN 1.0 12/27/2007 2245   NITRITE NEGATIVE 06/14/2017 1100   LEUKOCYTESUR NEGATIVE 06/14/2017 1100   Sepsis Labs Invalid input(s): PROCALCITONIN,  WBC,  LACTICIDVEN Microbiology Recent Results (from the past 240 hour(s))  Resp Panel by RT-PCR (Flu A&B, Covid) Nasopharyngeal Swab     Status: None   Collection Time: 09/07/21 10:55 AM   Specimen: Nasopharyngeal Swab; Nasopharyngeal(NP) swabs in vial transport medium  Result Value Ref Range Status   SARS Coronavirus 2 by RT PCR NEGATIVE NEGATIVE Final    Comment: (NOTE) SARS-CoV-2 target nucleic acids are NOT DETECTED.  The SARS-CoV-2 RNA is generally detectable in upper respiratory specimens during the acute phase of infection. The lowest concentration of SARS-CoV-2 viral copies this assay can detect is 138 copies/mL. A negative result does not preclude SARS-Cov-2 infection and should not be used as the sole basis for treatment or other patient management decisions. A negative result may occur with  improper specimen collection/handling, submission of specimen other than nasopharyngeal swab, presence of viral  mutation(s) within the areas targeted by this assay, and inadequate number of viral copies(<138 copies/mL). A negative result must be combined with clinical observations, patient history, and epidemiological information. The expected result is Negative.  Fact Sheet for Patients:  EntrepreneurPulse.com.au  Fact Sheet for Healthcare Providers:  IncredibleEmployment.be  This test is no t yet approved or cleared by the [place] FDA and  has been authorized for detection and/or diagnosis of SARS-CoV-2 by FDA under an Emergency Use Authorization (EUA). This EUA will remain  in effect (meaning this test can be used) for the duration of the COVID-19 declaration under Section 564(b)(1) of the Act, 21 U.S.C.section 360bbb-3(b)(1), unless the authorization is terminated  or revoked sooner.       Influenza A by PCR NEGATIVE NEGATIVE Final   Influenza B by PCR NEGATIVE NEGATIVE Final    Comment: (NOTE) The Xpert Xpress SARS-CoV-2/FLU/RSV plus assay is intended as an aid in the diagnosis of influenza from Nasopharyngeal swab specimens and should not be used as a sole basis for treatment. Nasal washings and aspirates are unacceptable for Xpert Xpress SARS-CoV-2/FLU/RSV testing.  Fact Sheet for Patients: EntrepreneurPulse.com.au  Fact Sheet for Healthcare Providers: IncredibleEmployment.be  This test is not yet approved or  cleared by the [place] and has been authorized for detection and/or diagnosis of SARS-CoV-2 by FDA under an Emergency Use Authorization (EUA). This EUA will remain in effect (meaning this test can be used) for the duration of the COVID-19 declaration under Section 564(b)(1) of the Act, 21 U.S.C. section 360bbb-3(b)(1), unless the authorization is terminated or revoked.  Performed at [facility], [address] [place], [place] [address]      Time coordinating discharge: Over 30  minutes  SIGNED:   Lismary Kiehn J [place] DO  [facility] 09/09/2021, 11:34 AM

## 2021-09-09 NOTE — TOC Transition Note (Signed)
Transition of Care [facility]) - CM/SW Discharge Note   Patient Details  Name: Alison [name] MRN: [id number] Date of Birth: 23-Dec-1960  [facility]) CM/SW Contact:  Pollie Friar, RN Phone Number: 09/09/2021, 11:41 AM   Clinical Narrative:    Patient is discharging to [facility] today. [facility] to provide transport. Bedside RN updated and d/c packet is at the desk.   Number for report: [phone].    Final next level of care: [employer] Barriers to Discharge: No Barriers Identified   Patient Goals and CMS Choice        Discharge Placement                       Discharge Plan and Services                                     Social Determinants of Health (SDOH) Interventions     Readmission Risk Interventions No flowsheet data found.

## 2021-09-09 NOTE — Progress Notes (Signed)
Engineer, maintenance [facility]) Hospital Liaison note.   Received request from [place] for family interest in [facility] with request for transfer today. Chart reviewed and eligibility confirmed.   Spoke to mother Barnetta Chapel to confirm interest and explain services. Family agreeable to transfer today. CSW aware.    Registration paperwork will be completed by mother. This Liaison will let TOC know once completed.  Dr. Orpah Melter to assume care per family request.    RN please call report to [phone].   Please arrange transport for patient once consents complete.  Thank you,  Clementeen Hoof, BSN, RN    [place] (listed on [facility] under Hospice and [place] of [phone]   [phone]

## 2021-09-09 NOTE — Progress Notes (Signed)
Patient had 2 witnessed seizures over a 15 minute period last approx. 10 seconds each.  Ativan 1mg  given.  Patient is postictal.   MD aware.

## 2021-09-09 NOTE — Progress Notes (Signed)
1mg  of ativan was given per order at 1537. Remaining 1mg  wasted with Ermalene Postin RN.

## 2021-09-09 NOTE — Progress Notes (Signed)
Report called to Psychologist, clinical at [facility].   Malena Catholic

## 2021-09-09 NOTE — Progress Notes (Signed)
Daily Progress Note   Patient Name: Alison [name]       Date: 09/09/2021 DOB: January 18, 1961  Age: 60 y.o. MRN#: [id number] Attending Physician: [place] Eric J, DO Primary Care Physician: Benito Mccreedy, MD Admit Date: 09/07/2021  Reason for Consultation/Follow-up: goals of care, symptom management, end of life care  Subjective: Chart reviewed. Note that patient was seen by neurosurgery yesterday evening and recommended for palliative radiation. Also note that patient had 2 witnessed seizures overnight.   I spoke with patient's mother Altha Harm by phone. Reviewed patient's current medical condition at length. Altha Harm is quite adamant that she does not want to pursue radiation therapy. She feels that it will only prolong the dying process for her daughter. She wants to continue full comfort measures and proceed with transfer to [facility]. Provided education on natural trajectory at EOL.   I went to see patient at bedside. She is resting comfortably and does not arouse to voice. Dr. [place] is present. I let him know that patient's mother does not want to pursue radiation therapy. He is going to start IV keppra. There is a visitor present at bedside, [place]; she is the Scientist, physiological from the patient's group home. Dr. [place] has updated her on patient's poor prognosis. Caryl Comes is tearful and expresses feeling liked she "missed something". We provided education and reassurance that patient's current medical situation could not have been prevented. Emotional support provided.   Additional time spent in communication and coordination of care with [facility] team and hospice liaison. [facility] eligibility has been confirmed and there is a bed available today.   Length of Stay: 2  Current  Medications: Scheduled Meds:   benztropine  1 mg Oral Daily   carvedilol  3.125 mg Oral BID   cromolyn  1 drop Both Eyes QID   cycloSPORINE  1 drop Both Eyes BID   dexamethasone (DECADRON) injection  4 mg Intravenous Q6H   LORazepam  1 mg Intravenous Q6H   OLANZapine  15 mg Oral QHS   pantoprazole  40 mg Oral Daily    Continuous Infusions:  levETIRAcetam 500 mg (09/09/21 1039)    PRN Meds: acetaminophen **OR** acetaminophen, antiseptic oral rinse, glycopyrrolate **OR** glycopyrrolate **OR** glycopyrrolate, haloperidol **OR** haloperidol **OR** haloperidol lactate, LORazepam **OR** LORazepam **OR** LORazepam, morphine injection, ondansetron **OR** ondansetron (ZOFRAN) IV, polyvinyl alcohol  Physical Exam Vitals reviewed.  Constitutional:      General: She is sleeping. She is not in acute distress.    Appearance: She is ill-appearing.  Pulmonary:     Effort: Pulmonary effort is normal.  Psychiatric:        Cognition and Memory: Cognition is impaired.            Vital Signs: BP 129/85 (BP Location: Left Arm)   Pulse (!) 120   Temp 97.8 F (36.6 C) (Oral)   Resp 20   Wt 60.9 kg   SpO2 92%   BMI 24.96 kg/m  SpO2: SpO2: 92 % O2 Device: O2 Device: Room Air O2 Flow Rate:         Palliative Assessment/Data: PPS 10%      Palliative Care Assessment & Plan   HPI/Patient Profile: 60 y.o. female  with past medical history of schizophrenia, tardive dyskinesia, diabetes mellitus type 2, right-sided breast cancer, hypertension, and hyperlipidemia. She presented to the [facility] emergency department on 09/07/2021 with altered mental status. In the ED, patient was hemodynamically stable. Initial CT head showed edema in the left parietal lobe concerning for metastatic disease. 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 CT abdomen and pelvis did not reveal any signs of metastasis but did show  a large LLL mass (concerning for primary lung cancer). EDP discussed with neurosurgery and patient was subsequently started on IV dexamethasone and transferred to [facility].   Assessment: - acute metabolic encephalopathy - left lung mass concerning for bronchogenic carcinoma - brain mass with vasogenic edema and midline shift - history of breast cancer - schizophrenia - tardive dyskinesia - end of life care  Recommendations/Plan: DNR/DNI - gold form signed and placed on chart Continue full comfort care Start scheduled Lorazepam (ativan) 1 mg IV every 6 hours Pending transfer to [facility] today   Goals of Care and Additional Recommendations: Limitations on Scope of Treatment: Full Comfort Care  Prognosis:  < 2 weeks  Discharge Planning: Hospice facility    Thank you for allowing the Palliative Medicine Team to assist in the care of this patient.   Total Time 65 minutes Prolonged Time Billed  yes       Greater than 50%  of this time was spent counseling and coordinating care related to the above assessment and plan.  Lavena Bullion, NP  Please contact Palliative Medicine Team phone at [phone] for questions and concerns.

## 2021-09-13 ENCOUNTER — Inpatient Hospital Stay: Payer: Medicaid Other

## 2021-10-12 DEATH — deceased

## 2021-11-18 ENCOUNTER — Encounter (HOSPITAL_COMMUNITY): Payer: Medicaid Other | Admitting: Psychiatry

## 2022-03-01 ENCOUNTER — Ambulatory Visit: Payer: Medicaid Other | Admitting: Hematology and Oncology
